# Patient Record
Sex: Male | Born: 1940
Health system: Southern US, Community
[De-identification: ages and names within clinical notes are randomized; demographics above are authoritative.]

## PROBLEM LIST (undated history)

## (undated) DIAGNOSIS — J302 Other seasonal allergic rhinitis: Secondary | ICD-10-CM

## (undated) DIAGNOSIS — R972 Elevated prostate specific antigen [PSA]: Secondary | ICD-10-CM

## (undated) DIAGNOSIS — I10 Essential (primary) hypertension: Secondary | ICD-10-CM

## (undated) DIAGNOSIS — N259 Disorder resulting from impaired renal tubular function, unspecified: Secondary | ICD-10-CM

## (undated) DIAGNOSIS — E119 Type 2 diabetes mellitus without complications: Secondary | ICD-10-CM

## (undated) DIAGNOSIS — M109 Gout, unspecified: Secondary | ICD-10-CM

## (undated) DIAGNOSIS — Z0000 Encounter for general adult medical examination without abnormal findings: Secondary | ICD-10-CM

## (undated) DIAGNOSIS — C61 Malignant neoplasm of prostate: Secondary | ICD-10-CM

## (undated) DIAGNOSIS — F528 Other sexual dysfunction not due to a substance or known physiological condition: Secondary | ICD-10-CM

## (undated) DIAGNOSIS — J309 Allergic rhinitis, unspecified: Secondary | ICD-10-CM

## (undated) DIAGNOSIS — E785 Hyperlipidemia, unspecified: Secondary | ICD-10-CM

## (undated) DIAGNOSIS — E669 Obesity, unspecified: Secondary | ICD-10-CM

## (undated) HISTORY — DX: Allergic rhinitis, unspecified: J30.9

## (undated) HISTORY — DX: Other seasonal allergic rhinitis: J30.2

## (undated) HISTORY — DX: Type 2 diabetes mellitus without complications: E11.9

## (undated) HISTORY — DX: Encounter for general adult medical examination without abnormal findings: Z00.00

## (undated) HISTORY — DX: Obesity, unspecified: E66.9

## (undated) HISTORY — DX: Other sexual dysfunction not due to a substance or known physiological condition: F52.8

## (undated) HISTORY — DX: Disorder resulting from impaired renal tubular function, unspecified: N25.9

## (undated) HISTORY — DX: Hyperlipidemia, unspecified: E78.5

## (undated) HISTORY — DX: Gout, unspecified: M10.9

## (undated) HISTORY — DX: Essential (primary) hypertension: I10

## (undated) HISTORY — DX: Elevated prostate specific antigen (PSA): R97.20

## (undated) HISTORY — DX: Malignant neoplasm of prostate: C61

---

## 2004-09-23 ENCOUNTER — Ambulatory Visit: Payer: Self-pay | Admitting: Internal Medicine

## 2005-12-02 ENCOUNTER — Ambulatory Visit: Payer: Self-pay | Admitting: Internal Medicine

## 2006-06-03 ENCOUNTER — Ambulatory Visit: Payer: Self-pay | Admitting: Internal Medicine

## 2007-05-08 ENCOUNTER — Encounter: Payer: Self-pay | Admitting: Internal Medicine

## 2007-05-08 DIAGNOSIS — F528 Other sexual dysfunction not due to a substance or known physiological condition: Secondary | ICD-10-CM

## 2007-05-08 DIAGNOSIS — E119 Type 2 diabetes mellitus without complications: Secondary | ICD-10-CM

## 2007-05-08 DIAGNOSIS — E785 Hyperlipidemia, unspecified: Secondary | ICD-10-CM

## 2007-05-08 DIAGNOSIS — E669 Obesity, unspecified: Secondary | ICD-10-CM | POA: Insufficient documentation

## 2007-05-08 DIAGNOSIS — Z794 Long term (current) use of insulin: Secondary | ICD-10-CM

## 2007-05-08 DIAGNOSIS — I10 Essential (primary) hypertension: Secondary | ICD-10-CM

## 2007-05-08 HISTORY — DX: Essential (primary) hypertension: I10

## 2007-05-08 HISTORY — DX: Obesity, unspecified: E66.9

## 2007-05-08 HISTORY — DX: Type 2 diabetes mellitus without complications: E11.9

## 2007-05-08 HISTORY — DX: Hyperlipidemia, unspecified: E78.5

## 2007-05-08 HISTORY — DX: Other sexual dysfunction not due to a substance or known physiological condition: F52.8

## 2007-06-30 ENCOUNTER — Ambulatory Visit: Payer: Self-pay | Admitting: Internal Medicine

## 2007-06-30 ENCOUNTER — Encounter: Payer: Self-pay | Admitting: Internal Medicine

## 2007-07-01 DIAGNOSIS — M109 Gout, unspecified: Secondary | ICD-10-CM

## 2007-07-01 DIAGNOSIS — N259 Disorder resulting from impaired renal tubular function, unspecified: Secondary | ICD-10-CM

## 2007-07-01 DIAGNOSIS — N183 Chronic kidney disease, stage 3 (moderate): Secondary | ICD-10-CM

## 2007-07-01 HISTORY — DX: Gout, unspecified: M10.9

## 2007-07-01 HISTORY — DX: Disorder resulting from impaired renal tubular function, unspecified: N25.9

## 2007-07-04 ENCOUNTER — Telehealth (INDEPENDENT_AMBULATORY_CARE_PROVIDER_SITE_OTHER): Payer: Self-pay | Admitting: *Deleted

## 2007-07-06 LAB — CONVERTED CEMR LAB
ALT: 31 units/L (ref 0–53)
Albumin: 3.9 g/dL (ref 3.5–5.2)
BUN: 19 mg/dL (ref 6–23)
Bilirubin, Direct: 0.1 mg/dL (ref 0.0–0.3)
Chloride: 100 meq/L (ref 96–112)
Creatinine, Ser: 2 mg/dL — ABNORMAL HIGH (ref 0.4–1.5)
Direct LDL: 98.1 mg/dL
Eosinophils Relative: 2.2 % (ref 0.0–5.0)
GFR calc Af Amer: 43 mL/min
Glucose, Bld: 213 mg/dL — ABNORMAL HIGH (ref 70–99)
Hemoglobin: 17.5 g/dL — ABNORMAL HIGH (ref 13.0–17.0)
Hgb A1c MFr Bld: 9.5 % — ABNORMAL HIGH (ref 4.6–6.0)
Ketones, ur: NEGATIVE mg/dL
MCV: 91.1 fL (ref 78.0–100.0)
Monocytes Relative: 9.3 % (ref 3.0–11.0)
PSA: 3.62 ng/mL (ref 0.10–4.00)
Potassium: 3.8 meq/L (ref 3.5–5.1)
RBC: 5.6 M/uL (ref 4.22–5.81)
TSH: 1.24 microintl units/mL (ref 0.35–5.50)
Total CHOL/HDL Ratio: 4.9
Total Protein, Urine: NEGATIVE mg/dL
Triglycerides: 214 mg/dL (ref 0–149)
WBC: 6.5 10*3/uL (ref 4.5–10.5)
pH: 5.5 (ref 5.0–8.0)

## 2008-03-16 ENCOUNTER — Ambulatory Visit: Payer: Self-pay | Admitting: Internal Medicine

## 2008-03-20 ENCOUNTER — Encounter: Payer: Self-pay | Admitting: Internal Medicine

## 2008-03-21 LAB — CONVERTED CEMR LAB
Calcium: 9.6 mg/dL (ref 8.4–10.5)
Chloride: 101 meq/L (ref 96–112)
Cholesterol: 149 mg/dL (ref 0–200)
Creatinine, Ser: 2.1 mg/dL — ABNORMAL HIGH (ref 0.4–1.5)
Direct LDL: 87.1 mg/dL
GFR calc non Af Amer: 33 mL/min
HDL: 38 mg/dL — ABNORMAL LOW (ref >39.0)
Triglycerides: 240 mg/dL (ref 0–149)
VLDL: 48 mg/dL — ABNORMAL HIGH (ref 0–40)

## 2008-06-22 ENCOUNTER — Ambulatory Visit: Payer: Self-pay | Admitting: Internal Medicine

## 2008-07-02 DIAGNOSIS — C61 Malignant neoplasm of prostate: Secondary | ICD-10-CM

## 2008-07-02 DIAGNOSIS — R972 Elevated prostate specific antigen [PSA]: Secondary | ICD-10-CM

## 2008-07-02 HISTORY — DX: Malignant neoplasm of prostate: C61

## 2008-07-02 HISTORY — DX: Elevated prostate specific antigen (PSA): R97.20

## 2009-07-01 ENCOUNTER — Ambulatory Visit: Payer: Self-pay | Admitting: Internal Medicine

## 2009-07-01 LAB — HM COLONOSCOPY

## 2009-12-27 ENCOUNTER — Ambulatory Visit: Payer: Self-pay | Admitting: Internal Medicine

## 2009-12-27 LAB — CONVERTED CEMR LAB
BUN: 23 mg/dL (ref 6–23)
Potassium: 3.9 meq/L (ref 3.5–5.1)
Sodium: 142 meq/L (ref 135–145)
Triglycerides: 79 mg/dL (ref 0.0–149.0)
VLDL: 15.8 mg/dL (ref 0.0–40.0)

## 2010-01-01 ENCOUNTER — Ambulatory Visit: Payer: Self-pay | Admitting: Internal Medicine

## 2010-03-05 ENCOUNTER — Encounter: Payer: Self-pay | Admitting: Internal Medicine

## 2010-03-24 ENCOUNTER — Encounter: Admission: RE | Admit: 2010-03-24 | Discharge: 2010-03-24 | Payer: Self-pay | Admitting: Nephrology

## 2010-05-26 ENCOUNTER — Encounter: Payer: Self-pay | Admitting: Internal Medicine

## 2010-06-05 ENCOUNTER — Encounter: Payer: Self-pay | Admitting: Internal Medicine

## 2010-06-25 ENCOUNTER — Ambulatory Visit: Payer: Self-pay | Admitting: Internal Medicine

## 2010-06-25 LAB — CONVERTED CEMR LAB
Albumin: 3.7 g/dL (ref 3.5–5.2)
BUN: 29 mg/dL — ABNORMAL HIGH (ref 6–23)
Basophils Relative: 0.5 % (ref 0.0–3.0)
CO2: 28 meq/L (ref 19–32)
Calcium: 9.7 mg/dL (ref 8.4–10.5)
Eosinophils Relative: 1.2 % (ref 0.0–5.0)
GFR calc non Af Amer: 35.79 mL/min (ref >60)
Glucose, Bld: 187 mg/dL — ABNORMAL HIGH (ref 70–99)
HCT: 47.4 % (ref 39.0–52.0)
Hemoglobin: 16.6 g/dL (ref 13.0–17.0)
Ketones, ur: NEGATIVE mg/dL
LDL Cholesterol: 91 mg/dL (ref 0–99)
Lymphs Abs: 2.2 10*3/uL (ref 0.7–4.0)
MCV: 91.8 fL (ref 78.0–100.0)
Microalb, Ur: 3.2 mg/dL — ABNORMAL HIGH (ref 0.0–1.9)
Monocytes Absolute: 0.9 10*3/uL (ref 0.1–1.0)
Monocytes Relative: 13.6 % — ABNORMAL HIGH (ref 3.0–12.0)
Neutro Abs: 3.1 10*3/uL (ref 1.4–7.7)
Neutrophils Relative %: 49.4 % (ref 43.0–77.0)
Nitrite: NEGATIVE
PSA: 5.98 ng/mL — ABNORMAL HIGH (ref 0.10–4.00)
Potassium: 4.5 meq/L (ref 3.5–5.1)
Sodium: 136 meq/L (ref 135–145)
Total Bilirubin: 0.7 mg/dL (ref 0.3–1.2)
Total CHOL/HDL Ratio: 4
Total Protein, Urine: NEGATIVE mg/dL
Urine Glucose: 250 mg/dL
Urobilinogen, UA: 0.2 (ref 0.0–1.0)

## 2010-07-01 ENCOUNTER — Ambulatory Visit: Payer: Self-pay | Admitting: Internal Medicine

## 2010-07-01 ENCOUNTER — Encounter: Payer: Self-pay | Admitting: Internal Medicine

## 2010-07-08 ENCOUNTER — Telehealth: Payer: Self-pay | Admitting: Internal Medicine

## 2010-08-12 ENCOUNTER — Ambulatory Visit: Payer: Self-pay | Admitting: Internal Medicine

## 2010-08-13 LAB — CONVERTED CEMR LAB
BUN: 31 mg/dL — ABNORMAL HIGH (ref 6–23)
Calcium: 9.8 mg/dL (ref 8.4–10.5)
GFR calc non Af Amer: 39.27 mL/min — ABNORMAL LOW (ref >60.00)
Glucose, Bld: 205 mg/dL — ABNORMAL HIGH (ref 70–99)
Sodium: 136 meq/L (ref 135–145)

## 2010-08-14 ENCOUNTER — Telehealth (INDEPENDENT_AMBULATORY_CARE_PROVIDER_SITE_OTHER): Payer: Self-pay | Admitting: *Deleted

## 2010-08-18 ENCOUNTER — Encounter: Payer: Self-pay | Admitting: Internal Medicine

## 2010-09-28 LAB — CONVERTED CEMR LAB
ALT: 21 units/L (ref 0–53)
ALT: 28 units/L (ref 0–53)
AST: 29 units/L (ref 0–37)
AST: 35 units/L (ref 0–37)
Albumin: 3.8 g/dL (ref 3.5–5.2)
Alkaline Phosphatase: 59 units/L (ref 39–117)
Alkaline Phosphatase: 62 units/L (ref 39–117)
BUN: 23 mg/dL (ref 6–23)
Basophils Absolute: 0 10*3/uL (ref 0.0–0.1)
Basophils Relative: 0 % (ref 0.0–3.0)
Basophils Relative: 0 % (ref 0.0–3.0)
Bilirubin, Direct: 0.1 mg/dL (ref 0.0–0.3)
Bilirubin, Direct: 0.1 mg/dL (ref 0.0–0.3)
CO2: 30 meq/L (ref 19–32)
Cholesterol: 147 mg/dL (ref 0–200)
Cholesterol: 154 mg/dL (ref 0–200)
Creatinine, Ser: 2.1 mg/dL — ABNORMAL HIGH (ref 0.4–1.5)
Creatinine,U: 148.1 mg/dL
Creatinine,U: 280.9 mg/dL
Eosinophils Absolute: 0.1 10*3/uL (ref 0.0–0.7)
GFR calc Af Amer: 40 mL/min
GFR calc non Af Amer: 33 mL/min
HCT: 49.6 % (ref 39.0–52.0)
HDL: 38.7 mg/dL — ABNORMAL LOW (ref >39.0)
HDL: 39.2 mg/dL (ref >39.00)
Hemoglobin, Urine: NEGATIVE
Hemoglobin: 16.6 g/dL (ref 13.0–17.0)
Hemoglobin: 17.1 g/dL — ABNORMAL HIGH (ref 13.0–17.0)
Ketones, ur: NEGATIVE mg/dL
Ketones, ur: NEGATIVE mg/dL
LDL Cholesterol: 94 mg/dL (ref 0–99)
Leukocytes, UA: NEGATIVE
Lymphocytes Relative: 28.8 % (ref 12.0–46.0)
Lymphocytes Relative: 35.6 % (ref 12.0–46.0)
MCHC: 33.6 g/dL (ref 30.0–36.0)
MCHC: 34 g/dL (ref 30.0–36.0)
MCV: 92.2 fL (ref 78.0–100.0)
MCV: 94.3 fL (ref 78.0–100.0)
Microalb Creat Ratio: 10.7 mg/g (ref 0.0–30.0)
Microalb, Ur: 3 mg/dL — ABNORMAL HIGH (ref 0.0–1.9)
Monocytes Absolute: 0.4 10*3/uL (ref 0.1–1.0)
Monocytes Absolute: 0.8 10*3/uL (ref 0.1–1.0)
Monocytes Relative: 12.9 % — ABNORMAL HIGH (ref 3.0–12.0)
Monocytes Relative: 6.4 % (ref 3.0–12.0)
Neutrophils Relative %: 63.3 % (ref 43.0–77.0)
Nitrite: NEGATIVE
Nitrite: NEGATIVE
PSA: 4.63 ng/mL — ABNORMAL HIGH (ref 0.10–4.00)
Platelets: 129 10*3/uL — ABNORMAL LOW (ref 150–400)
Potassium: 3.5 meq/L (ref 3.5–5.1)
Potassium: 3.7 meq/L (ref 3.5–5.1)
RBC: 5.26 M/uL (ref 4.22–5.81)
RBC: 5.45 M/uL (ref 4.22–5.81)
RDW: 11.9 % (ref 11.5–14.6)
Specific Gravity, Urine: 1.025 (ref 1.000–1.03)
Specific Gravity, Urine: >=1.03 (ref 1.000–1.030)
TSH: 1.23 microintl units/mL (ref 0.35–5.50)
TSH: 1.25 microintl units/mL (ref 0.35–5.50)
Total Bilirubin: 0.8 mg/dL (ref 0.3–1.2)
Total Protein: 7.4 g/dL (ref 6.0–8.3)
Total Protein: 7.8 g/dL (ref 6.0–8.3)
Urine Glucose: 250 mg/dL
Urine Glucose: 250 mg/dL — AB
Urobilinogen, UA: 0.2 (ref 0.0–1.0)
Urobilinogen, UA: 1 (ref 0.0–1.0)
VLDL: 18 mg/dL (ref 0–40)
WBC: 6.3 10*3/uL (ref 4.5–10.5)
pH: 5.5 (ref 5.0–8.0)

## 2010-09-30 NOTE — Progress Notes (Signed)
  Phone Note Refill Request Message from:  Fax from Pharmacy on July 08, 2010 3:53 PM  Refills Requested: Medication #1:  DILTIAZEM HCL ER BEADS 240 MG  CP24 1 by mouth once daily   Dosage confirmed as above?Dosage Confirmed   Notes: [facility]  Medication #2:  LANTUS 100 UNIT/ML SOLN Inject 65 unit subcutaneously once a day   Dosage confirmed as above?Dosage Confirmed   Notes: [facility]  Medication #3:  LOVASTATIN 40 MG TABS Take 1 tablet by mouth once a day   Dosage confirmed as above?Dosage Confirmed   Notes: [facility]  Medication #4:  LISINOPRIL-HYDROCHLOROTHIAZIDE 20-12.5 MG TABS 2 by mouth once daily   Dosage confirmed as above?Dosage Confirmed   Notes: [facility] Initial call taken by: Robin Ewing CMA Duncan Dull),  July 08, 2010 3:54 PM    Prescriptions: LISINOPRIL-HYDROCHLOROTHIAZIDE 20-12.5 MG TABS (LISINOPRIL-HYDROCHLOROTHIAZIDE) 2 by mouth once daily  #180 x 3   Entered by:   Scharlene Gloss CMA (AAMA)   Authorized by:   Corwin Levins MD   Signed by:   Scharlene Gloss CMA (AAMA) on 07/08/2010   Method used:   [facility] ...       [facility]             , [place]         Ph:        Fax: [phone]   RxID:   [id number] LOVASTATIN 40 MG TABS (LOVASTATIN) Take 1 tablet by mouth once a day  #90 x 3   Entered by:   Zella Ball Ewing CMA (AAMA)   Authorized by:   Corwin Levins MD   Signed by:   Scharlene Gloss CMA (AAMA) on 07/08/2010   Method used:   Faxed to ...       [facility]             , [place]         Ph:        Fax: [phone]   RxID:   [id number] LANTUS 100 UNIT/ML SOLN (INSULIN GLARGINE) Inject 65 unit subcutaneously once a day  #6vials x 11   Entered by:   Zella Ball Ewing CMA (AAMA)   Authorized by:   Corwin Levins MD   Signed by:   Scharlene Gloss CMA (AAMA) on 07/08/2010   Method used:   Faxed to ...       [facility]             , [place]         Ph:        Fax: [phone]   RxID:   [id number] DILTIAZEM HCL ER  BEADS 240 MG  CP24 (DILTIAZEM HCL ER BEADS) 1 by mouth once daily  #90 x 3   Entered by:   Zella Ball Ewing CMA (AAMA)   Authorized by:   Corwin Levins MD   Signed by:   Scharlene Gloss CMA (AAMA) on 07/08/2010   Method used:   Faxed to ...       [facility]             , [place]         Ph:        Fax: [phone]   RxID:   [id number]

## 2010-09-30 NOTE — Consult Note (Signed)
Summary: [facility]  [facility]   Imported By: Lennie Odor 06/16/2010 15:43:38  _____________________________________________________________________  External Attachment:    Type:   Image     Comment:   External Document

## 2010-09-30 NOTE — Assessment & Plan Note (Signed)
Summary: YEARLY--6 MTH---STC   Vital Signs:  Patient profile:   70 year old male Height:      72 inches Weight:      208.25 pounds BMI:     28.35 O2 Sat:      96 % on Room air Temp:     98.4 degrees F oral Pulse rate:   64 / minute BP sitting:   152 / 78  (left arm) Cuff size:   regular  Vitals Entered By: Margaret Pyle, CMA (July 01, 2010 1:12 PM)  O2 Flow:  Room air  Preventive Care Screening     declines colonoscopy again  CC: Yearly Is Patient Diabetic? Yes   CC:  Yearly.  History of Present Illness: here for wellness and f/u;  overall doing well;  Pt denies CP, worsening sob, doe, wheezing, orthopnea, pnd, worsening LE edema, palps, dizziness or syncope  Pt denies new neuro symptoms such as headache, facial or extremity weakness  Pt denies polydipsia, polyuria, but only taking 60 units lantus due to frequent low AM sugars.  Overall good compliance with meds, trying to follow low chol, DM diet, wt stable, little excercise however No fever, wt loss, night sweats, loss of appetite or other constitutional symptoms Denies worsening depressive symptoms, suicidal ideation, or panic.  Pt states good ability with ADL's, low fall risk, home safety reviewed and adequate, no significant change in hearing or vision, trying to follow lower chol diet, and occasionally active only with regular excercise.   Preventive Screening-Counseling & Management      Drug Use:  no.    Problems Prior to Update: 1)  Psa, Increased  (ICD-790.93) 2)  Preventive Health Care  (ICD-V70.0) 3)  Preventive Health Care  (ICD-V70.0) 4)  Gout  (ICD-274.9) 5)  Renal Insufficiency  (ICD-588.9) 6)  Hypertension  (ICD-401.9) 7)  Hyperlipidemia  (ICD-272.4) 8)  Diabetes Mellitus, Type II  (ICD-250.00) 9)  Obesity  (ICD-278.00) 10)  Erectile Dysfunction  (ICD-302.72)  Medications Prior to Update: 1)  Diltiazem Hcl Er Beads 240 Mg  Cp24 (Diltiazem Hcl Er Beads) .[name].. 1 By Mouth Once Daily 2)   Lantus 100 Unit/ml Soln (Insulin Glargine) .... Inject 85 Unit Subcutaneously Once A Day 3)  Lovastatin 40 Mg Tabs (Lovastatin) .... Take 1 Tablet By Mouth Once A Day 4)  Lisinopril-Hydrochlorothiazide 20-12.5 Mg Tabs (Lisinopril-Hydrochlorothiazide) .... 2 By Mouth Once Daily 5)  Viagra 100 Mg Tabs (Sildenafil Citrate) .... Take 1 Tablet By Mouth Once A Day 6)  Tramadol Hcl 50 Mg Tabs (Tramadol Hcl) .[name].. 1po Q 6 Hrs As Needed Pain 7)  Ecotrin Low Strength 81 Mg  Tbec (Aspirin) .[name].. 1po Once Daily  Current Medications (verified): 1)  Diltiazem Hcl Er Beads 240 Mg  Cp24 (Diltiazem Hcl Er Beads) .[name].. 1 By Mouth Once Daily 2)  Lantus 100 Unit/ml Soln (Insulin Glargine) .... Inject 65 Unit Subcutaneously Once A Day 3)  Lovastatin 40 Mg Tabs (Lovastatin) .... Take 1 Tablet By Mouth Once A Day 4)  Lisinopril-Hydrochlorothiazide 20-12.5 Mg Tabs (Lisinopril-Hydrochlorothiazide) .... 2 By Mouth Once Daily 5)  Ecotrin Low Strength 81 Mg  Tbec (Aspirin) .[name].. 1po Once Daily 6)  Januvia 50 Mg Tabs (Sitagliptin Phosphate) .[name].. 1po Once Daily 7)  Pain Med Per Renal 8)  Levitra 20 Mg Tabs (Vardenafil Hcl) .[name].. 1 By Mouth Once Daily As Needed  Allergies (verified): 1)  Nsaids  Past History:  Past Medical History: Last updated: 06/30/2007 Erectile Dysfunction Obesity Diabetes mellitus, type II Hyperlipidemia  Hypertension Renal insufficiency Gout  Past Surgical History: Last updated: 06/30/2007 none  Family History: Last updated: 06/22/2008 Family History Hypertension 1 child died - homicide in [place] - 08/20/2023  Social History: Last updated: 07/01/2010 Former Smoker Alcohol use-no Married 6 children retired since 2005 - [employer] Drug use-no  Risk Factors: Smoking Status: quit (06/30/2007)  Social History: Former Smoker Alcohol use-no Married 6 children retired since 2005 - [employer] Drug use-no Drug Use:  no  Review of Systems  The patient denies  anorexia, fever, vision loss, decreased hearing, hoarseness, chest pain, syncope, dyspnea on exertion, peripheral edema, prolonged cough, headaches, hemoptysis, abdominal pain, melena, hematochezia, severe indigestion/heartburn, hematuria, muscle weakness, suspicious skin lesions, transient blindness, difficulty walking, depression, unusual weight change, abnormal bleeding, enlarged lymph nodes, and angioedema.         all otherwise negative per pt -    Physical Exam  General:  alert and overweight-appearing.   Head:  normocephalic and atraumatic.   Eyes:  vision grossly intact, pupils equal, and pupils round.   Ears:  R ear normal and L ear normal.   Nose:  no external deformity and no nasal discharge.   Mouth:  no gingival abnormalities and pharynx pink and moist.   Neck:  supple and no masses.   Lungs:  normal respiratory effort and normal breath sounds.   Heart:  normal rate and regular rhythm.   Abdomen:  soft, non-tender, and normal bowel sounds.   Msk:  no joint tenderness and no joint swelling.   Extremities:  no edema, no erythema  Neurologic:  cranial nerves II-XII intact and strength normal in all extremities.   Skin:  color normal and no rashes.   Psych:  not anxious appearing and not depressed appearing.     Impression & Recommendations:  Problem # 1:  Preventive Health Care (ICD-V70.0) Overall doing well, age appropriate education and counseling updated, referral for preventive services and immunizations addressed, dietary counseling and smoking status adressed , most recent labs reviewed, ecg reviewed I have personally reviewed and have noted 1.The patient's medical and social history 2.Their use of alcohol, tobacco or illicit drugs 3.Their current medications and supplements 4. Functional ability including ADL's, fall risk, home safety risk, hearing & visual impairment  Overall doing well, age appropriate education and counseling updated, referral for preventive  services and immunizations addressed, dietary counseling and smoking status adressed , most recent labs reviewed, ecg reviewed or declined I have personally reviewed and have noted 1.The patient's medical and social history 2.Their use of alcohol, tobacco or illicit drugs 3.Their current medications and supplements 4. Functional ability including ADL's, fall risk, home safety risk, hearing & visual impairment 5.Diet and physical activities 6.Evidence for depression or mood disorders The patients weight, height, BMI  have been recorded in the chart I have made referrals, counseling and provided education to the patient based review of the above  Orders: EKG w/ Interpretation (93000)  Problem # 2:  PSA, INCREASED (ICD-790.93) for urology referral - r/o cancer prostate Orders: Urology Referral (Urology)  Problem # 3:  DIABETES MELLITUS, TYPE II (ICD-250.00)  His updated medication list for this problem includes:    Lantus 100 Unit/ml Soln (Insulin glargine) ..... Inject 65 unit subcutaneously once a day    Lisinopril-hydrochlorothiazide 20-12.5 Mg Tabs (Lisinopril-hydrochlorothiazide) .[name]... 2 by mouth once daily    Ecotrin Low Strength 81 Mg Tbec (Aspirin) .[name]... 1po once daily    Januvia 50 Mg Tabs (Sitagliptin phosphate) .Marland KitchenMarland KitchenMarland KitchenMarland Kitchen  1po once daily to incr the lantus from 60 to 65 units, and add januvia 50 mg daily  Labs Reviewed: Creat: 2.3 (06/25/2010)    Reviewed HgBA1c results: 8.8 (06/25/2010)  9.8 (12/27/2009)  Problem # 4:  RENAL INSUFFICIENCY (ICD-588.9) stable overall by hx and exam, ok to continue meds/tx as is , for renal f/u soon per pt  Problem # 5:  HYPERTENSION (ICD-401.9)  His updated medication list for this problem includes:    Diltiazem Hcl Er Beads 240 Mg Cp24 (Diltiazem hcl er beads) .[name]... 1 by mouth once daily    Lisinopril-hydrochlorothiazide 20-12.5 Mg Tabs (Lisinopril-hydrochlorothiazide) .[name]... 2 by mouth once daily Normal BP at home per pt - Continue all  previous medications as before this visit   BP today: 152/78 Prior BP: 150/78 (01/01/2010)  Labs Reviewed: K+: 4.5 (06/25/2010) Creat: : 2.3 (06/25/2010)   Chol: 159 (06/25/2010)   HDL: 41.40 (06/25/2010)   LDL: 91 (06/25/2010)   TG: 135.0 (06/25/2010)  Problem # 6:  HYPERLIPIDEMIA (ICD-272.4)  His updated medication list for this problem includes:    Lovastatin 40 Mg Tabs (Lovastatin) .[name]... Take 1 tablet by mouth once a day  Labs Reviewed: SGOT: 29 (06/25/2010)   SGPT: 20 (06/25/2010)   HDL:41.40 (06/25/2010), 38.80 (12/27/2009)  LDL:91 (06/25/2010), 89 (12/27/2009)  Chol:159 (06/25/2010), 144 (12/27/2009)  Trig:135.0 (06/25/2010), 79.0 (12/27/2009) stable overall by hx and exam, ok to continue meds/tx as is   Problem # 7:  ERECTILE DYSFUNCTION (ICD-302.72)  The following medications were removed from the medication list:    Viagra 100 Mg Tabs (Sildenafil citrate) .[name]... Take 1 tablet by mouth once a day His updated medication list for this problem includes:    Levitra 20 Mg Tabs (Vardenafil hcl) .[name]... 1 by mouth once daily as needed treat as above, f/u any worsening signs or symptoms   Complete Medication List: 1)  Diltiazem Hcl Er Beads 240 Mg Cp24 (Diltiazem hcl er beads) .[name].. 1 by mouth once daily 2)  Lantus 100 Unit/ml Soln (Insulin glargine) .... Inject 65 unit subcutaneously once a day 3)  Lovastatin 40 Mg Tabs (Lovastatin) .... Take 1 tablet by mouth once a day 4)  Lisinopril-hydrochlorothiazide 20-12.5 Mg Tabs (Lisinopril-hydrochlorothiazide) .... 2 by mouth once daily 5)  Ecotrin Low Strength 81 Mg Tbec (Aspirin) .[name].. 1po once daily 6)  Januvia 50 Mg Tabs (Sitagliptin phosphate) .[name].. 1po once daily 7)  Pain Med Per Renal  8)  Levitra 20 Mg Tabs (Vardenafil hcl) .[name].. 1 by mouth once daily as needed  Other Orders: Admin 1st Vaccine (16109) Flu Vaccine 48yrs + [phone])  Patient Instructions: 1)  You will be contacted about the referral(s) to: urology 2)  you are given  the new prescription for the levitra (remember it is $9 per pill at [facility] or [facility]) 3)  you are also given the other prescriptions to send to [facility] 4)  increase the lantus to 65 units per day 5)  start the januvia 50 mg per day 6)  Please return for lab only in 6 wks: 7)  BMP prior to visit, ICD-9: 250.02 8)  HbgA1C prior to visit, ICD-9: 9)  Please call the number on the [facility] Card for results of your testing  10)  Please schedule a follow-up appointment in 4 months with: 11)  BMP prior to visit, ICD-9: 250.02 12)  Lipid Panel prior to visit, ICD-9: 13)  HbgA1C prior to visit, ICD-9: Prescriptions: LANTUS 100 UNIT/ML SOLN (INSULIN GLARGINE) Inject 65 unit subcutaneously once  a day  #6vials x 11   Entered and Authorized by:   Corwin Levins MD   Signed by:   Corwin Levins MD on 07/01/2010   Method used:   Print then Give to Patient   RxID:   [id number] JANUVIA 50 MG TABS (SITAGLIPTIN PHOSPHATE) 1po once daily  #90 x 3   Entered and Authorized by:   Corwin Levins MD   Signed by:   Corwin Levins MD on 07/01/2010   Method used:   Print then Give to Patient   RxID:   [id number] LISINOPRIL-HYDROCHLOROTHIAZIDE 20-12.5 MG TABS (LISINOPRIL-HYDROCHLOROTHIAZIDE) 2 by mouth once daily  #180 x 3   Entered and Authorized by:   Corwin Levins MD   Signed by:   Corwin Levins MD on 07/01/2010   Method used:   Print then Give to Patient   RxID:   [id number] LOVASTATIN 40 MG TABS (LOVASTATIN) Take 1 tablet by mouth once a day  #90 x 3   Entered and Authorized by:   Corwin Levins MD   Signed by:   Corwin Levins MD on 07/01/2010   Method used:   Print then Give to Patient   RxID:   [id number] LANTUS 100 UNIT/ML SOLN (INSULIN GLARGINE) Inject 65 unit subcutaneously once a day  #5 pens x 3   Entered and Authorized by:   Corwin Levins MD   Signed by:   Corwin Levins MD on 07/01/2010   Method used:   Print then Give to Patient   RxID:   [id number] LEVITRA 20 MG TABS  (VARDENAFIL HCL) 1 by mouth once daily as needed  #5 x 11   Entered and Authorized by:   Corwin Levins MD   Signed by:   Corwin Levins MD on 07/01/2010   Method used:   Print then Give to Patient   RxID:   [id number] DILTIAZEM HCL ER BEADS 240 MG  CP24 (DILTIAZEM HCL ER BEADS) 1 by mouth once daily  #90 x 3   Entered and Authorized by:   Corwin Levins MD   Signed by:   Corwin Levins MD on 07/01/2010   Method used:   Print then Give to Patient   RxID:   [id number]    Orders Added: 1)  Admin 1st Vaccine [90471] 2)  Flu Vaccine 83yrs + [76283] 3)  EKG w/ Interpretation [93000] 4)  Urology Referral [Urology] 5)  Est. Patient 65& > [15176]   Flu Vaccine Consent Questions     Do you have a history of severe allergic reactions to this vaccine? no    Any prior history of allergic reactions to egg and/or gelatin? no    Do you have a sensitivity to the preservative Thimersol? no    Do you have a past history of Guillan-Barre Syndrome? no    Do you currently have an acute febrile illness? no    Have you ever had a severe reaction to latex? no    Vaccine information given and explained to patient? yes    Are you currently pregnant? no    Lot Number:[id number]   Exp Date:02/28/2011   Site Given  Left Deltoid [id number]

## 2010-09-30 NOTE — Consult Note (Signed)
Summary: [facility]  [facility]   Imported By: Lennie Odor 03/18/2010 10:38:18  _____________________________________________________________________  External Attachment:    Type:   Image     Comment:   External Document

## 2010-09-30 NOTE — Assessment & Plan Note (Signed)
Summary: 6 mo rov /nws  #   Vital Signs:  Patient profile:   70 year old male Height:      74 inches Weight:      211 pounds BMI:     27.19 O2 Sat:      96 % on Room air Temp:     97.6 degrees F oral Pulse rate:   59 / minute BP sitting:   150 / 78  (left arm) Cuff size:   large  Vitals Entered ByZella Ball Ewing (Jan 01, 2010 1:24 PM)  O2 Flow:  Room air CC: 6 Month ROV/RE   CC:  6 Month ROV/RE.  History of Present Illness: overall doing ok;  BP at home usually < 140/90;  Pt denies CP, sob, doe, wheezing, orthopnea, pnd, worsening LE edema, palps, dizziness or syncope  Pt denies new neuro symptoms such as headache, facial or extremity weakness Admits to some dietary noncomplaicne  but having also low sugars ooccas in the am on currnet lantus.  Goes to gym 3 times per wk - feels better to do that.  Pt denies polydipsia, polyuria, or low sugar symptoms such as shakiness improved with eating.  Overall good compliance with meds, trying to follow low chol, DM diet, wt stable, little excercise however, and cbg/s often in the 200 - 300's.    Problems Prior to Update: 1)  Psa, Increased  (ICD-790.93) 2)  Preventive Health Care  (ICD-V70.0) 3)  Preventive Health Care  (ICD-V70.0) 4)  Gout  (ICD-274.9) 5)  Renal Insufficiency  (ICD-588.9) 6)  Hypertension  (ICD-401.9) 7)  Hyperlipidemia  (ICD-272.4) 8)  Diabetes Mellitus, Type II  (ICD-250.00) 9)  Obesity  (ICD-278.00) 10)  Erectile Dysfunction  (ICD-302.72)  Medications Prior to Update: 1)  Diltiazem Hcl Er Beads 240 Mg  Cp24 (Diltiazem Hcl Er Beads) .[name].. 1 By Mouth Once Daily 2)  Lantus 100 Unit/ml Soln (Insulin Glargine) .... Inject 85 Unit Subcutaneously Once A Day 3)  Lovastatin 40 Mg Tabs (Lovastatin) .... Take 1 Tablet By Mouth Once A Day 4)  Lisinopril-Hydrochlorothiazide 20-12.5 Mg Tabs (Lisinopril-Hydrochlorothiazide) .... 2 By Mouth Once Daily 5)  Viagra 100 Mg Tabs (Sildenafil Citrate) .... Take 1 Tablet By Mouth Once A  Day 6)  Indomethacin 50 Mg Caps (Indomethacin) .... Take 1 Capsule By Mouth Three Times A Day 7)  Ecotrin Low Strength 81 Mg  Tbec (Aspirin) .[name].. 1po Once Daily  Current Medications (verified): 1)  Diltiazem Hcl Er Beads 240 Mg  Cp24 (Diltiazem Hcl Er Beads) .[name].. 1 By Mouth Once Daily 2)  Lantus 100 Unit/ml Soln (Insulin Glargine) .... Inject 85 Unit Subcutaneously Once A Day 3)  Lovastatin 40 Mg Tabs (Lovastatin) .... Take 1 Tablet By Mouth Once A Day 4)  Lisinopril-Hydrochlorothiazide 20-12.5 Mg Tabs (Lisinopril-Hydrochlorothiazide) .... 2 By Mouth Once Daily 5)  Viagra 100 Mg Tabs (Sildenafil Citrate) .... Take 1 Tablet By Mouth Once A Day 6)  Indomethacin 50 Mg Caps (Indomethacin) .... Take 1 Capsule By Mouth Three Times A Day 7)  Ecotrin Low Strength 81 Mg  Tbec (Aspirin) .[name].. 1po Once Daily  Allergies (verified): No Known Drug Allergies  Past History:  Past Medical History: Last updated: 06/30/2007 Erectile Dysfunction Obesity Diabetes mellitus, type II Hyperlipidemia Hypertension Renal insufficiency Gout  Past Surgical History: Last updated: 06/30/2007 none  Social History: Last updated: 06/22/2008 Former Smoker Alcohol use-no Married 6 children retired since 2005 - [employer]  Risk Factors: Smoking Status: quit (06/30/2007)  Review  of Systems       all otherwise negative per pt -    Physical Exam  General:  alert and overweight-appearing.   Head:  normocephalic and atraumatic.   Eyes:  vision grossly intact, pupils equal, and pupils round.   Ears:  R ear normal and L ear normal.   Nose:  no external deformity and no nasal discharge.   Mouth:  no gingival abnormalities and pharynx pink and moist.   Neck:  supple and no masses.   Lungs:  normal respiratory effort and normal breath sounds.   Heart:  normal rate and regular rhythm.   Extremities:  no edema, no erythema    Impression & Recommendations:  Problem # 1:  RENAL INSUFFICIENCY  (ICD-588.9)  worsening, likely due to co-morbids;  refer renal  Orders: Nephrology Referral (Nephro)  Problem # 2:  DIABETES MELLITUS, TYPE II (ICD-250.00)  His updated medication list for this problem includes:    Lantus 100 Unit/ml Soln (Insulin glargine) ..... Inject 85 unit subcutaneously once a day    Lisinopril-hydrochlorothiazide 20-12.5 Mg Tabs (Lisinopril-hydrochlorothiazide) .[name]... 2 by mouth once daily    Ecotrin Low Strength 81 Mg Tbec (Aspirin) .[name]... 1po once daily with low AM sugars on current lantus;  will refer to Dr Glenna Fellows for further eval and tx, declines dietary referral at this time, has been once with his wife  Labs Reviewed: Creat: 2.4 (12/27/2009)    Reviewed HgBA1c results: 9.8 (12/27/2009)  9.0 (07/01/2009)  Orders: Endocrinology Referral (Endocrine)  Problem # 3:  HYPERLIPIDEMIA (ICD-272.4)  His updated medication list for this problem includes:    Lovastatin 40 Mg Tabs (Lovastatin) .[name]... Take 1 tablet by mouth once a day  Labs Reviewed: SGOT: 29 (07/01/2009)   SGPT: 21 (07/01/2009)   HDL:38.80 (12/27/2009), 39.20 (07/01/2009)  LDL:89 (12/27/2009), 94 (07/01/2009)  Chol:144 (12/27/2009), 147 (07/01/2009)  Trig:79.0 (12/27/2009), 69.0 (07/01/2009) stable overall by hx and exam, ok to continue meds/tx as is   Problem # 4:  HYPERTENSION (ICD-401.9)  His updated medication list for this problem includes:    Diltiazem Hcl Er Beads 240 Mg Cp24 (Diltiazem hcl er beads) .[name]... 1 by mouth once daily    Lisinopril-hydrochlorothiazide 20-12.5 Mg Tabs (Lisinopril-hydrochlorothiazide) .[name]... 2 by mouth once daily  BP today: 150/78 Prior BP: 162/90 (07/01/2009)  Labs Reviewed: K+: 3.9 (12/27/2009) Creat: : 2.4 (12/27/2009)   Chol: 144 (12/27/2009)   HDL: 38.80 (12/27/2009)   LDL: 89 (12/27/2009)   TG: 79.0 (12/27/2009) I suspect mild uncontrolled, but he does not want med change today  Complete Medication List: 1)  Diltiazem Hcl Er Beads 240 Mg Cp24  (Diltiazem hcl er beads) .[name].. 1 by mouth once daily 2)  Lantus 100 Unit/ml Soln (Insulin glargine) .... Inject 85 unit subcutaneously once a day 3)  Lovastatin 40 Mg Tabs (Lovastatin) .... Take 1 tablet by mouth once a day 4)  Lisinopril-hydrochlorothiazide 20-12.5 Mg Tabs (Lisinopril-hydrochlorothiazide) .... 2 by mouth once daily 5)  Viagra 100 Mg Tabs (Sildenafil citrate) .... Take 1 tablet by mouth once a day 6)  Indomethacin 50 Mg Caps (Indomethacin) .... Take 1 capsule by mouth three times a day 7)  Ecotrin Low Strength 81 Mg Tbec (Aspirin) .[name].. 1po once daily  Patient Instructions: 1)  Continue all previous medications as before this visit 2)  You will be contacted about the referral(s) to: Endocrinology (dr Everardo All), and Nephrology ([facility]) 3)  Please schedule a follow-up appointment in 6 months with CPX labs and : 4)  HbgA1C prior to visit, ICD-9: 250.02 5)  Urine Microalbumin prior to visit, ICD-9:

## 2010-10-02 NOTE — Medication Information (Signed)
Summary: Prior autho & approved for Januvia/[employer]  Prior autho & approved for Januvia/[employer]   Imported By: Sherian Rein 08/22/2010 09:02:10  _____________________________________________________________________  External Attachment:    Type:   Image     Comment:   External Document

## 2010-10-02 NOTE — Progress Notes (Signed)
Summary: PA-Januvia  Phone Note From Pharmacy   Summary of Call: PA-Januvia called [employer] @ [phone], awaiting  form. Dagoberto Reef  August 14, 2010 4:28 PM Form to Dr Jonny Ruiz to complete. Dagoberto Reef  August 14, 2010 4:45 PM  PA faxed to [facility] @ [phone], awaiting approval.  PA Approved until 08/17/12, pt aware . Dagoberto Reef  August 18, 2010 9:21 AM  Initial call taken by: Dagoberto Reef,  August 18, 2010 4:49 PM

## 2010-10-24 ENCOUNTER — Other Ambulatory Visit: Payer: Self-pay

## 2010-10-28 ENCOUNTER — Other Ambulatory Visit: Payer: Self-pay | Admitting: Internal Medicine

## 2010-10-28 ENCOUNTER — Other Ambulatory Visit: Payer: Self-pay

## 2010-10-28 ENCOUNTER — Encounter (INDEPENDENT_AMBULATORY_CARE_PROVIDER_SITE_OTHER): Payer: Self-pay | Admitting: *Deleted

## 2010-10-28 DIAGNOSIS — E1165 Type 2 diabetes mellitus with hyperglycemia: Secondary | ICD-10-CM

## 2010-10-28 LAB — LIPID PANEL
HDL: 40.4 mg/dL (ref >39.00)
LDL Cholesterol: 91 mg/dL (ref 0–99)
Total CHOL/HDL Ratio: 4
VLDL: 19 mg/dL (ref 0.0–40.0)

## 2010-10-28 LAB — BASIC METABOLIC PANEL
CO2: 26 mEq/L (ref 19–32)
Chloride: 98 mEq/L (ref 96–112)
GFR: 43.18 mL/min — ABNORMAL LOW (ref >60.00)
Sodium: 135 mEq/L (ref 135–145)

## 2010-10-31 ENCOUNTER — Encounter: Payer: Self-pay | Admitting: Internal Medicine

## 2010-10-31 ENCOUNTER — Ambulatory Visit (INDEPENDENT_AMBULATORY_CARE_PROVIDER_SITE_OTHER): Payer: Medicare PPO | Admitting: Internal Medicine

## 2010-10-31 DIAGNOSIS — E119 Type 2 diabetes mellitus without complications: Secondary | ICD-10-CM

## 2010-10-31 DIAGNOSIS — N259 Disorder resulting from impaired renal tubular function, unspecified: Secondary | ICD-10-CM

## 2010-10-31 DIAGNOSIS — E785 Hyperlipidemia, unspecified: Secondary | ICD-10-CM

## 2010-10-31 DIAGNOSIS — I10 Essential (primary) hypertension: Secondary | ICD-10-CM

## 2010-11-06 NOTE — Assessment & Plan Note (Addendum)
Summary: 4 MO ROV /NWS #   Vital Signs:  Patient profile:   70 year old male Height:      74 inches Weight:      208.38 pounds BMI:     26.85 O2 Sat:      96 % on Room air Temp:     97.9 degrees F oral Pulse rate:   66 / minute BP sitting:   112 / 72  (left arm) Cuff size:   large  Vitals Entered By: Zella Ball Ewing CMA (AAMA) (October 31, 2010 1:24 PM)  O2 Flow:  Room air  CC: 4 month ROV/RE   CC:  4 month ROV/RE.  History of Present Illness: here to f/u;  has incontrolled blood sugar overall, pt has fiarly consistent low blood sugars in the AM between 70 and 100 with symtpoms, such that he often does not take the whole 65 units lantus per day;  sugars during the day range over 200 often; Pt denies CP, worsening sob, doe, wheezing, orthopnea, pnd, worsening LE edema, palps, dizziness or syncope  Pt denies new neuro symptoms such as headache, facial or extremity weakness  Pt denies polydipsia, polyuria  Overall good compliance with meds, trying to follow low chol, DM diet, wt stable, little excercise however  .  Overall good compliance with meds, and good tolerability.  No fever, wt loss, night sweats, loss of appetite or other constitutional symptoms  Denies worsening depressive symptoms, suicidal ideation, or panic.    Problems Prior to Update: 1)  Psa, Increased  (ICD-790.93) 2)  Preventive Health Care  (ICD-V70.0) 3)  Preventive Health Care  (ICD-V70.0) 4)  Gout  (ICD-274.9) 5)  Renal Insufficiency  (ICD-588.9) 6)  Hypertension  (ICD-401.9) 7)  Hyperlipidemia  (ICD-272.4) 8)  Diabetes Mellitus, Type II  (ICD-250.00) 9)  Obesity  (ICD-278.00) 10)  Erectile Dysfunction  (ICD-302.72)  Medications Prior to Update: 1)  Diltiazem Hcl Er Beads 240 Mg  Cp24 (Diltiazem Hcl Er Beads) .[name].. 1 By Mouth Once Daily 2)  Lantus 100 Unit/ml Soln (Insulin Glargine) .... Inject 65 Unit Subcutaneously Once A Day 3)  Lovastatin 40 Mg Tabs (Lovastatin) .... Take 1 Tablet By Mouth Once A Day 4)   Lisinopril-Hydrochlorothiazide 20-12.5 Mg Tabs (Lisinopril-Hydrochlorothiazide) .... 2 By Mouth Once Daily 5)  Ecotrin Low Strength 81 Mg  Tbec (Aspirin) .[name].. 1po Once Daily 6)  Januvia 100 Mg Tabs (Sitagliptin Phosphate) .[name].. 1po Once Daily 7)  Pain Med Per Renal 8)  Levitra 20 Mg Tabs (Vardenafil Hcl) .[name].. 1 By Mouth Once Daily As Needed  Current Medications (verified): 1)  Diltiazem Hcl Er Beads 240 Mg  Cp24 (Diltiazem Hcl Er Beads) .[name].. 1 By Mouth Once Daily 2)  Lantus 100 Unit/ml Soln (Insulin Glargine) .... Inject 50 Unit Subcutaneously Once A Day 3)  Lovastatin 40 Mg Tabs (Lovastatin) .... Take 1 Tablet By Mouth Once A Day 4)  Lisinopril-Hydrochlorothiazide 20-12.5 Mg Tabs (Lisinopril-Hydrochlorothiazide) .... 2 By Mouth Once Daily 5)  Ecotrin Low Strength 81 Mg  Tbec (Aspirin) .[name].. 1po Once Daily 6)  Pain Med Per Renal 7)  Levitra 20 Mg Tabs (Vardenafil Hcl) .[name].. 1 By Mouth Once Daily As Needed 8)  Gout Med Per Renal .... 1 By Mouth Two Times A Day 9)  Humalog Kwikpen 100 Unit/ml Soln (Insulin Lispro (Human)) .... Use Asd Ssi  - Up To 10 Units With Meals and Before Bedtime 10)  Lantus Solostar Needle .... Use Asd 1 Once Daily 11)  Humalog Kwikpen  Needles .... Use Asd Four Times Per Day (Ac and At Bedtime)  Allergies (verified): 1)  Nsaids  Past History:  Past Medical History: Last updated: 06/30/2007 Erectile Dysfunction Obesity Diabetes mellitus, type II Hyperlipidemia Hypertension Renal insufficiency Gout  Past Surgical History: Last updated: 06/30/2007 none  Social History: Last updated: 07/01/2010 Former Smoker Alcohol use-no Married 6 children retired since 2005 - [employer] Drug use-no  Risk Factors: Smoking Status: quit (06/30/2007)  Review of Systems       all otherwise negative per pt -    Physical Exam  General:  alert and overweight-appearing.   Head:  normocephalic and atraumatic.   Eyes:  vision grossly intact, pupils equal, and  pupils round.   Ears:  R ear normal and L ear normal.   Nose:  no external deformity and no nasal discharge.   Mouth:  no gingival abnormalities and pharynx pink and moist.   Neck:  supple and no masses.   Lungs:  normal respiratory effort and normal breath sounds.   Heart:  normal rate and regular rhythm.   Extremities:  no edema, no erythema    Impression & Recommendations:  Problem # 1:  DIABETES MELLITUS, TYPE II (ICD-250.00)  The following medications were removed from the medication list:    Januvia 100 Mg Tabs (Sitagliptin phosphate) .[name]... 1po once daily His updated medication list for this problem includes:    Lantus 100 Unit/ml Soln (Insulin glargine) ..... Inject 50 unit subcutaneously once a day    Lisinopril-hydrochlorothiazide 20-12.5 Mg Tabs (Lisinopril-hydrochlorothiazide) .[name]... 2 by mouth once daily    Ecotrin Low Strength 81 Mg Tbec (Aspirin) .[name]... 1po once daily    Humalog Kwikpen 100 Unit/ml Soln (Insulin lispro (human)) ..... Use asd ssi  - up to 10 units with meals and before bedtime poor controlled in the face of renal insufficiency and low sugars limiting his overall dose lantus;  will decrease the lantus to 50 units per day; d/c the januvia, and start humalog kwikpen SSI  with cbg' ac and qhs  Labs Reviewed: Creat: 2.0 (10/28/2010)    Reviewed HgBA1c results: 9.7 (10/28/2010)  8.8 (08/12/2010)  Problem # 2:  HYPERTENSION (ICD-401.9)  His updated medication list for this problem includes:    Diltiazem Hcl Er Beads 240 Mg Cp24 (Diltiazem hcl er beads) .[name]... 1 by mouth once daily    Lisinopril-hydrochlorothiazide 20-12.5 Mg Tabs (Lisinopril-hydrochlorothiazide) .[name]... 2 by mouth once daily  BP today: 112/72 Prior BP: 152/78 (07/01/2010)  Labs Reviewed: K+: 4.0 (10/28/2010) Creat: : 2.0 (10/28/2010)   Chol: 150 (10/28/2010)   HDL: 40.40 (10/28/2010)   LDL: 91 (10/28/2010)   TG: 95.0 (10/28/2010) stable overall by hx and exam, ok to continue meds/tx as is    Problem # 3:  HYPERLIPIDEMIA (ICD-272.4)  His updated medication list for this problem includes:    Lovastatin 40 Mg Tabs (Lovastatin) .[name]... Take 1 tablet by mouth once a day  Labs Reviewed: SGOT: 29 (06/25/2010)   SGPT: 20 (06/25/2010)   HDL:40.40 (10/28/2010), 41.40 (06/25/2010)  LDL:91 (10/28/2010), 91 (06/25/2010)  Chol:150 (10/28/2010), 159 (06/25/2010)  Trig:95.0 (10/28/2010), 135.0 (06/25/2010) stable overall by hx and exam, ok to continue meds/tx as is   Problem # 4:  RENAL INSUFFICIENCY (ICD-588.9) overall stable, I faxed results to Dr patel/renal;  follow closely;  pt has f/u appt with renal in 2 mo  Complete Medication List: 1)  Diltiazem Hcl Er Beads 240 Mg Cp24 (Diltiazem hcl er beads) .[name].. 1 by mouth once  daily 2)  Lantus 100 Unit/ml Soln (Insulin glargine) .... Inject 50 unit subcutaneously once a day 3)  Lovastatin 40 Mg Tabs (Lovastatin) .... Take 1 tablet by mouth once a day 4)  Lisinopril-hydrochlorothiazide 20-12.5 Mg Tabs (Lisinopril-hydrochlorothiazide) .... 2 by mouth once daily 5)  Ecotrin Low Strength 81 Mg Tbec (Aspirin) .[name].. 1po once daily 6)  Pain Med Per Renal  7)  Levitra 20 Mg Tabs (Vardenafil hcl) .[name].. 1 by mouth once daily as needed 8)  Gout Med Per Renal  .... 1 by mouth two times a day 9)  Humalog Kwikpen 100 Unit/ml Soln (Insulin lispro (human)) .... Use asd ssi  - up to 10 units with meals and before bedtime 10)  Lantus Solostar Needle  .... Use asd 1 once daily 11)  Humalog Kwikpen Needles  .... Use asd four times per day (ac and at bedtime)  Patient Instructions: 1)  stop the januvia 2)  decrease the lanuts to 50 units 3)  please start checking your blood sugars before each meal and before bedtime (a total of 4 times per day) 4)  start the humalog kwikpen with "sliding scale"  : 5)       < 150  none 6)       151 - 200   -     2 units 7)       201 - 250   -     4 units 8)       251 - 300  -      6 units 9)       301 - 350   -     8  units 10)          > 350            10 units 11)  If your blood sugar is consistently more than 350 , please call the office so that we can adjust the lantus or the sliding scale 12)  Please call with your blood sugars in 1 wk to let us know how you are doing 13)  You are given the sample oif Humalog, and the Coupon for the Free Month of Humalog as well 14)  Please schedule a follow-up appointment in 2 months with: 15)  BMP prior to visit, ICD-9: 250.02 16)  Lipid Panel prior to visit, ICD-9: 17)  HbgA1C prior to visit, ICD-9: Prescriptions: HUMALOG KWIKPEN 100 UNIT/ML SOLN (INSULIN LISPRO (HUMAN)) use asd SSI  - up to 10 units with meals and before bedtime  #5pens x 11   Entered and Authorized by:   Corwin Levins MD   Signed by:   Corwin Levins MD on 10/31/2010   Method used:   Print then Give to Patient   RxID:   [id number] LANTUS 100 UNIT/ML SOLN (INSULIN GLARGINE) Inject 50 unit subcutaneously once a day  #5pens x 11   Entered and Authorized by:   Corwin Levins MD   Signed by:   Corwin Levins MD on 10/31/2010   Method used:   Print then Give to Patient   RxID:   [id number] LANTUS 100 UNIT/ML SOLN (INSULIN GLARGINE) Inject 50 unit subcutaneously once a day  #5 pens x 11   Entered and Authorized by:   Corwin Levins MD   Signed by:   Corwin Levins MD on 10/31/2010   Method used:   Print then Give to Patient   RxID:   [id number] HUMALOG KWIKPEN NEEDLES  USE ASD four times per day (ac and at bedtime)  #400 x 11   Entered and Authorized by:   Corwin Levins MD   Signed by:   Corwin Levins MD on 10/31/2010   Method used:   Print then Give to Patient   RxID:   [id number] LANTUS SOLOSTAR NEEDLE use asd 1 once daily  #100 x 11   Entered and Authorized by:   Corwin Levins MD   Signed by:   Corwin Levins MD on 10/31/2010   Method used:   Print then Give to Patient   RxID:   [id number] LANTUS SOLOSTART NEEDLE use asd 1 once daily  #100 x 11   Entered and Authorized by:    Corwin Levins MD   Signed by:   Corwin Levins MD on 10/31/2010   Method used:   Print then Give to Patient   RxID:   [id number] HUMALOG KWIKPEN 100 UNIT/ML SOLN (INSULIN LISPRO (HUMAN)) use asd SSI  - up to 10 units with meals and before bedtime  #5 pens x 11   Entered and Authorized by:   Corwin Levins MD   Signed by:   Corwin Levins MD on 10/31/2010   Method used:   Print then Give to Patient   RxID:   [id number]    Orders Added: 1)  Est. Patient Level IV [63875]  Appended Document: 4 MO ROV /NWS # received note regarding Blood sugars  -   CBG;s most recently 120, 95, 144, 129  OK to cont same treatment  robin - to call pt to inform  Appended Document: 4 MO ROV /NWS # called pt informed of above information

## 2010-11-10 ENCOUNTER — Encounter: Payer: Self-pay | Admitting: Internal Medicine

## 2010-11-18 NOTE — Letter (Signed)
Summary: Blood Sugar readings/Patient  Blood Sugar readings/Patient   Imported By: Sherian Rein 11/14/2010 12:55:33  _____________________________________________________________________  External Attachment:    Type:   Image     Comment:   External Document

## 2010-12-25 ENCOUNTER — Other Ambulatory Visit: Payer: Medicare PPO

## 2010-12-25 ENCOUNTER — Other Ambulatory Visit: Payer: Self-pay | Admitting: Internal Medicine

## 2010-12-28 ENCOUNTER — Encounter: Payer: Self-pay | Admitting: Internal Medicine

## 2010-12-28 DIAGNOSIS — Z0000 Encounter for general adult medical examination without abnormal findings: Secondary | ICD-10-CM | POA: Insufficient documentation

## 2010-12-28 HISTORY — DX: Encounter for general adult medical examination without abnormal findings: Z00.00

## 2010-12-30 ENCOUNTER — Other Ambulatory Visit (INDEPENDENT_AMBULATORY_CARE_PROVIDER_SITE_OTHER): Payer: Medicare PPO

## 2010-12-30 ENCOUNTER — Other Ambulatory Visit (INDEPENDENT_AMBULATORY_CARE_PROVIDER_SITE_OTHER): Payer: Medicare PPO | Admitting: Internal Medicine

## 2010-12-30 DIAGNOSIS — Z13220 Encounter for screening for lipoid disorders: Secondary | ICD-10-CM

## 2010-12-30 DIAGNOSIS — E785 Hyperlipidemia, unspecified: Secondary | ICD-10-CM

## 2010-12-30 DIAGNOSIS — E1165 Type 2 diabetes mellitus with hyperglycemia: Secondary | ICD-10-CM

## 2010-12-30 LAB — LIPID PANEL
HDL: 34.2 mg/dL — ABNORMAL LOW (ref >39.00)
Triglycerides: 311 mg/dL — ABNORMAL HIGH (ref 0.0–149.0)
VLDL: 62.2 mg/dL — ABNORMAL HIGH (ref 0.0–40.0)

## 2010-12-30 LAB — BASIC METABOLIC PANEL
Creatinine, Ser: 2.1 mg/dL — ABNORMAL HIGH (ref 0.4–1.5)
GFR: 39.87 mL/min — ABNORMAL LOW (ref >60.00)
Potassium: 4.1 mEq/L (ref 3.5–5.1)

## 2011-01-01 ENCOUNTER — Ambulatory Visit (INDEPENDENT_AMBULATORY_CARE_PROVIDER_SITE_OTHER): Payer: Medicare PPO | Admitting: Internal Medicine

## 2011-01-01 ENCOUNTER — Encounter: Payer: Self-pay | Admitting: Internal Medicine

## 2011-01-01 DIAGNOSIS — E785 Hyperlipidemia, unspecified: Secondary | ICD-10-CM

## 2011-01-01 DIAGNOSIS — Z0000 Encounter for general adult medical examination without abnormal findings: Secondary | ICD-10-CM

## 2011-01-01 DIAGNOSIS — N259 Disorder resulting from impaired renal tubular function, unspecified: Secondary | ICD-10-CM

## 2011-01-01 DIAGNOSIS — E119 Type 2 diabetes mellitus without complications: Secondary | ICD-10-CM

## 2011-01-01 DIAGNOSIS — I10 Essential (primary) hypertension: Secondary | ICD-10-CM

## 2011-01-01 NOTE — Assessment & Plan Note (Signed)
stable overall by hx and exam, most recent lab reviewed with pt, and pt to continue medical treatment as before  BP Readings from Last 3 Encounters:  01/01/11 134/70  10/31/10 112/72  07/01/10 152/78

## 2011-01-01 NOTE — Progress Notes (Signed)
  Subjective:    Patient ID: Eric Wade, male    DOB: November 27, 1940, 70 y.o.   MRN: [id number]  HPI Here to f/u; overall doing ok,  Pt denies chest pain, increased sob or doe, wheezing, orthopnea, PND, increased LE swelling, palpitations, dizziness or syncope.  Pt denies new neurological symptoms such as new headache, or facial or extremity weakness or numbness   Pt denies polydipsia, polyuria, bur has several  low sugar symptoms such as weakness or confusion improved with po intake due to variable po intake and insulin admin..  Pt states overall o/w good compliance with meds, trying to follow lower cholesterol, diabetic diet, wt overall stable but little exercise however.   Past Medical History  Diagnosis Date  . DIABETES MELLITUS, TYPE II 05/08/2007  . HYPERLIPIDEMIA 05/08/2007  . GOUT 07/01/2007  . OBESITY 05/08/2007  . ERECTILE DYSFUNCTION 05/08/2007  . HYPERTENSION 05/08/2007  . RENAL INSUFFICIENCY 07/01/2007  . PSA, INCREASED 07/02/2008  . Preventative health care 12/28/2010   No past surgical history on file.  reports that he has quit smoking. He does not have any smokeless tobacco history on file. He reports that he does not drink alcohol or use illicit drugs. family history includes Hypertension in his other. Allergies  Allergen Reactions  . Nsaids     REACTION: renal insufficiency   Current Outpatient Prescriptions on File Prior to Visit  Medication Sig Dispense Refill  . aspirin 81 MG EC tablet Take 81 mg by mouth daily.        [name] diltiazem (DILACOR XR) 240 MG 24 hr capsule Take 240 mg by mouth daily.        . insulin lispro (HUMALOG KWIKPEN) 100 UNIT/ML injection Use as directed SSI up to 10 units with meals and before bedtime.       [name] lisinopril-hydrochlorothiazide (PRINZIDE,ZESTORETIC) 20-12.5 MG per tablet Take 1 tablet by mouth 2 (two) times daily.        [name] lovastatin (MEVACOR) 40 MG tablet Take 40 mg by mouth daily.        . vardenafil (LEVITRA) 20 MG tablet Take 20 mg by mouth  daily as needed.        . insulin glargine (LANTUS) 100 UNIT/ML injection Inject 50 Units into the skin daily.         Review of Systems All otherwise neg per pt     Objective:   Physical Exam BP 134/70  Pulse 66  Temp(Src) 97.9 F (36.6 C) (Oral)  Ht 6\' 2"  (1.88 m)  Wt 207 lb (93.895 kg)  BMI 26.58 kg/m2  SpO2 95% Physical Exam  VS noted Constitutional: Pt appears well-developed and well-nourished.  HENT: Head: Normocephalic.  Right Ear: External ear normal.  Left Ear: External ear normal.  Eyes: Conjunctivae and EOM are normal. Pupils are equal, round, and reactive to light.  Neck: Normal range of motion. Neck supple.  Cardiovascular: Normal rate and regular rhythm.   Pulmonary/Chest: Effort normal and breath sounds normal.  Abd:  Soft, NT, non-distended, + BS Neurological: Pt is alert. No cranial nerve deficit.  Skin: Skin is warm. No erythema. No edema Psychiatric: Pt behavior is normal. Thought content normal.        Assessment & Plan:

## 2011-01-01 NOTE — Assessment & Plan Note (Signed)
stable overall by hx and exam, most recent lab reviewed with pt, and pt to continue medical treatment as before  Lab Results  Component Value Date   LDLCALC 91 10/28/2010

## 2011-01-01 NOTE — Patient Instructions (Signed)
Continue all other medications as before Please keep your appointments with your specialists as you have planned - Dr Allena Katz Please return in 6 mo with Lab testing done 3-5 days before

## 2011-01-01 NOTE — Assessment & Plan Note (Signed)
stable overall by hx and exam, most recent lab reviewed with pt, and pt to continue medical treatment as before, volume OK today, has regular f/u with Dr patel/renal as well  Lab Results  Component Value Date   WBC 6.3 06/25/2010   HGB 16.6 06/25/2010   HCT 47.4 06/25/2010   PLT 173.0 06/25/2010   CHOL 136 12/30/2010   TRIG 311.0* 12/30/2010   HDL 34.20* 12/30/2010   LDLDIRECT 72.5 12/30/2010   ALT 20 06/25/2010   AST 29 06/25/2010   NA 139 12/30/2010   K 4.1 12/30/2010   CL 104 12/30/2010   CREATININE 2.1* 12/30/2010   BUN 29* 12/30/2010   CO2 28 12/30/2010   TSH 1.20 06/25/2010   PSA 5.98* 06/25/2010   HGBA1C 8.6* 12/30/2010   MICROALBUR 3.2* 06/25/2010

## 2011-01-01 NOTE — Assessment & Plan Note (Signed)
Improved but still uncontrolled, with variable diet and insulin admin appears to be about as good as can be expected, declines endo or dietary referral at this time, Continue all other medications as before, f/u labs next visit  Lab Results  Component Value Date   HGBA1C 8.6* 12/30/2010

## 2011-01-13 ENCOUNTER — Other Ambulatory Visit: Payer: Self-pay

## 2011-01-13 ENCOUNTER — Encounter: Payer: Self-pay | Admitting: Internal Medicine

## 2011-01-13 MED ORDER — LOVASTATIN 40 MG PO TABS: 40.0000 mg | ORAL_TABLET | Freq: Every day | ORAL | Status: DC

## 2011-01-13 MED ORDER — DILTIAZEM HCL ER 240 MG PO CP24: 240.0000 mg | ORAL_CAPSULE | Freq: Every day | ORAL | Status: DC

## 2011-01-13 MED ORDER — LISINOPRIL-HYDROCHLOROTHIAZIDE 20-12.5 MG PO TABS: 1.0000 | ORAL_TABLET | Freq: Two times a day (BID) | ORAL | Status: DC

## 2011-05-22 ENCOUNTER — Other Ambulatory Visit: Payer: Self-pay | Admitting: Internal Medicine

## 2011-05-22 NOTE — Telephone Encounter (Signed)
Done per emr 

## 2011-07-03 ENCOUNTER — Other Ambulatory Visit (INDEPENDENT_AMBULATORY_CARE_PROVIDER_SITE_OTHER): Payer: Medicare PPO

## 2011-07-03 DIAGNOSIS — Z125 Encounter for screening for malignant neoplasm of prostate: Secondary | ICD-10-CM

## 2011-07-03 DIAGNOSIS — Z0000 Encounter for general adult medical examination without abnormal findings: Secondary | ICD-10-CM

## 2011-07-03 DIAGNOSIS — E119 Type 2 diabetes mellitus without complications: Secondary | ICD-10-CM

## 2011-07-03 LAB — MICROALBUMIN / CREATININE URINE RATIO
Creatinine,U: 129.1 mg/dL
Microalb Creat Ratio: 1.5 mg/g (ref 0.0–30.0)

## 2011-07-03 LAB — URINALYSIS, ROUTINE W REFLEX MICROSCOPIC
Leukocytes, UA: NEGATIVE
Nitrite: NEGATIVE
Specific Gravity, Urine: 1.02 (ref 1.000–1.030)
Urobilinogen, UA: 0.2 (ref 0.0–1.0)
pH: 5.5 (ref 5.0–8.0)

## 2011-07-03 LAB — CBC WITH DIFFERENTIAL/PLATELET
Basophils Absolute: 0 10*3/uL (ref 0.0–0.1)
Eosinophils Relative: 1.8 % (ref 0.0–5.0)
HCT: 49.9 % (ref 39.0–52.0)
Hemoglobin: 17.1 g/dL — ABNORMAL HIGH (ref 13.0–17.0)
Lymphocytes Relative: 35.4 % (ref 12.0–46.0)
Lymphs Abs: 2.3 10*3/uL (ref 0.7–4.0)
Monocytes Relative: 11 % (ref 3.0–12.0)
Platelets: 165 10*3/uL (ref 150.0–400.0)
RDW: 13.1 % (ref 11.5–14.6)
WBC: 6.4 10*3/uL (ref 4.5–10.5)

## 2011-07-03 LAB — BASIC METABOLIC PANEL
BUN: 32 mg/dL — ABNORMAL HIGH (ref 6–23)
Calcium: 9.6 mg/dL (ref 8.4–10.5)
GFR: 36.78 mL/min — ABNORMAL LOW (ref >60.00)
Glucose, Bld: 247 mg/dL — ABNORMAL HIGH (ref 70–99)
Potassium: 4.1 mEq/L (ref 3.5–5.1)
Sodium: 135 mEq/L (ref 135–145)

## 2011-07-03 LAB — HEPATIC FUNCTION PANEL
ALT: 29 U/L (ref 0–53)
AST: 35 U/L (ref 0–37)
Total Protein: 7.6 g/dL (ref 6.0–8.3)

## 2011-07-03 LAB — LIPID PANEL
Cholesterol: 142 mg/dL (ref 0–200)
HDL: 38.8 mg/dL — ABNORMAL LOW (ref >39.00)
VLDL: 26.8 mg/dL (ref 0.0–40.0)

## 2011-07-03 LAB — TSH: TSH: 0.9 u[IU]/mL (ref 0.35–5.50)

## 2011-07-07 ENCOUNTER — Encounter: Payer: Self-pay | Admitting: Internal Medicine

## 2011-07-07 ENCOUNTER — Ambulatory Visit (INDEPENDENT_AMBULATORY_CARE_PROVIDER_SITE_OTHER): Payer: Medicare PPO | Admitting: Internal Medicine

## 2011-07-07 VITALS — BP 130/60 | HR 60 | Temp 97.6°F | Ht 74.0 in | Wt 209.4 lb

## 2011-07-07 DIAGNOSIS — Z0000 Encounter for general adult medical examination without abnormal findings: Secondary | ICD-10-CM

## 2011-07-07 DIAGNOSIS — E119 Type 2 diabetes mellitus without complications: Secondary | ICD-10-CM

## 2011-07-07 DIAGNOSIS — Z23 Encounter for immunization: Secondary | ICD-10-CM

## 2011-07-07 DIAGNOSIS — R972 Elevated prostate specific antigen [PSA]: Secondary | ICD-10-CM

## 2011-07-07 MED ORDER — SITAGLIPTIN PHOSPHATE 50 MG PO TABS: 50.0000 mg | ORAL_TABLET | Freq: Every day | ORAL | Status: DC

## 2011-07-07 NOTE — Assessment & Plan Note (Signed)
Worsened, and pt has yet to see urology as planned previously (has sick daughter, mult other stressors);  Encouraged pt to see urology - will refer

## 2011-07-07 NOTE — Assessment & Plan Note (Signed)

## 2011-07-07 NOTE — Assessment & Plan Note (Signed)
Overall control improved , but still with variable diet, and does not want to take the humalog in addition to the lantus, has occasional low sugars in the AM even on the lantus;  Ok to add [employer]

## 2011-07-07 NOTE — Patient Instructions (Addendum)
You had the flu shot today You will be contacted regarding the referral for: urology Your recent blood work will be faxed to Dr Allena Katz, as well as [facility] Take all new medications as prescribed - the januvia at 50 mg per day Continue all other medications as before (except stop the humalog as you have) You are otherwise up to date today Please return in 6 mo with Lab testing done 3-5 days before

## 2011-07-12 ENCOUNTER — Encounter: Payer: Self-pay | Admitting: Internal Medicine

## 2011-07-12 NOTE — Progress Notes (Signed)
Subjective:    Patient ID: Eric Wade, male    DOB: May 17, 1941, 70 y.o.   MRN: [id number]  HPI  Here for wellness and f/u;  Overall doing ok;  Pt denies CP, worsening SOB, DOE, wheezing, orthopnea, PND, worsening LE edema, palpitations, dizziness or syncope.  Pt denies neurological change such as new Headache, facial or extremity weakness.  Pt denies polydipsia, polyuria, or low sugar symptoms. Pt states overall good compliance with treatment and medications, good tolerability, and trying to follow lower cholesterol diet.  Pt denies worsening depressive symptoms, suicidal ideation or panic. No fever, wt loss, night sweats, loss of appetite, or other constitutional symptoms.  Pt states good ability with ADL's, low fall risk, home safety reviewed and adequate, no significant changes in hearing or vision, and occasionally active with exercise. CBG's in the low 100's. Past Medical History  Diagnosis Date  . DIABETES MELLITUS, TYPE II 05/08/2007  . HYPERLIPIDEMIA 05/08/2007  . GOUT 07/01/2007  . OBESITY 05/08/2007  . ERECTILE DYSFUNCTION 05/08/2007  . HYPERTENSION 05/08/2007  . RENAL INSUFFICIENCY 07/01/2007  . PSA, INCREASED 07/02/2008  . Preventative health care 12/28/2010   No past surgical history on file.  reports that he has quit smoking. He does not have any smokeless tobacco history on file. He reports that he does not drink alcohol or use illicit drugs. family history includes Hypertension in his other. Allergies  Allergen Reactions  . Nsaids     REACTION: renal insufficiency   Current Outpatient Prescriptions on File Prior to Visit  Medication Sig Dispense Refill  . aspirin 81 MG EC tablet Take 81 mg by mouth daily.        [name] DILT-XR 240 MG 24 hr capsule TAKE 1 CAPSULE DAILY  90 each  3  . insulin glargine (LANTUS) 100 UNIT/ML injection Inject 50 Units into the skin daily.        [name] lisinopril-hydrochlorothiazide (PRINZIDE,ZESTORETIC) 20-12.5 MG per tablet TAKE 1 TABLET TWICE DAILY  180  tablet  3  . lovastatin (MEVACOR) 40 MG tablet Take 1 tablet (40 mg total) by mouth daily.  90 tablet  1  . traMADol (ULTRAM) 50 MG tablet TAKE 1 TABLET EVERY 6 HOURS AS NEEDED FOR PAIN  60 tablet  1  . vardenafil (LEVITRA) 20 MG tablet Take 20 mg by mouth daily as needed.         Review of Systems Review of Systems  Constitutional: Negative for diaphoresis, activity change, appetite change and unexpected weight change.  HENT: Negative for hearing loss, ear pain, facial swelling, mouth sores and neck stiffness.   Eyes: Negative for pain, redness and visual disturbance.  Respiratory: Negative for shortness of breath and wheezing.   Cardiovascular: Negative for chest pain and palpitations.  Gastrointestinal: Negative for diarrhea, blood in stool, abdominal distention and rectal pain.  Genitourinary: Negative for hematuria, flank pain and decreased urine volume.  Musculoskeletal: Negative for myalgias and joint swelling.  Skin: Negative for color change and wound.  Neurological: Negative for syncope and numbness.  Hematological: Negative for adenopathy.  Psychiatric/Behavioral: Negative for hallucinations, self-injury, decreased concentration and agitation.      Objective:   Physical Exam BP 130/60  Pulse 60  Temp(Src) 97.6 F (36.4 C) (Oral)  Ht 6\' 2"  (1.88 m)  Wt 209 lb 6 oz (94.972 kg)  BMI 26.88 kg/m2  SpO2 96% Physical Exam  VS noted Constitutional: Pt is oriented to person, place, and time. Appears well-developed and well-nourished.  HENT:  Head: Normocephalic  and atraumatic.  Right Ear: External ear normal.  Left Ear: External ear normal.  Nose: Nose normal.  Mouth/Throat: Oropharynx is clear and moist.  Eyes: Conjunctivae and EOM are normal. Pupils are equal, round, and reactive to light.  Neck: Normal range of motion. Neck supple. No JVD present. No tracheal deviation present.  Cardiovascular: Normal rate, regular rhythm, normal heart sounds and intact distal pulses.     Pulmonary/Chest: Effort normal and breath sounds normal.  Abdominal: Soft. Bowel sounds are normal. There is no tenderness.  Musculoskeletal: Normal range of motion. Exhibits no edema.  Lymphadenopathy:  Has no cervical adenopathy.  Neurological: Pt is alert and oriented to person, place, and time. Pt has normal reflexes. No cranial nerve deficit.  Skin: Skin is warm and dry. No rash noted.  Psychiatric:  Has  normal mood and affect. Behavior is normal.     Assessment & Plan:

## 2011-07-14 ENCOUNTER — Other Ambulatory Visit: Payer: Self-pay | Admitting: Internal Medicine

## 2011-09-30 ENCOUNTER — Other Ambulatory Visit: Payer: Self-pay | Admitting: Internal Medicine

## 2011-09-30 MED ORDER — INSULIN GLARGINE 100 UNIT/ML ~~LOC~~ SOLN: 50.0000 [IU] | Freq: Every day | SUBCUTANEOUS | Status: DC

## 2011-10-02 ENCOUNTER — Telehealth: Payer: Self-pay

## 2011-10-02 NOTE — Telephone Encounter (Signed)
Called the patient left a message that patient assistance form has been completed by [facility] and is ready for pickup at the front desk. Called the patient several times and left messages to pickup form.

## 2011-10-12 ENCOUNTER — Other Ambulatory Visit: Payer: Self-pay

## 2011-10-12 MED ORDER — DILTIAZEM HCL ER 240 MG PO CP24: 240.0000 mg | ORAL_CAPSULE | Freq: Every day | ORAL | Status: DC

## 2011-10-12 MED ORDER — TRAMADOL HCL 50 MG PO TABS: 50.0000 mg | ORAL_TABLET | Freq: Two times a day (BID) | ORAL | Status: DC | PRN

## 2011-10-12 MED ORDER — LISINOPRIL-HYDROCHLOROTHIAZIDE 20-12.5 MG PO TABS: 1.0000 | ORAL_TABLET | Freq: Two times a day (BID) | ORAL | Status: DC

## 2011-10-12 NOTE — Telephone Encounter (Signed)
All done per emr

## 2011-12-11 ENCOUNTER — Other Ambulatory Visit: Payer: Self-pay

## 2011-12-11 MED ORDER — INSULIN GLARGINE 100 UNIT/ML ~~LOC~~ SOLN: 50.0000 [IU] | Freq: Every day | SUBCUTANEOUS | Status: DC

## 2011-12-17 ENCOUNTER — Other Ambulatory Visit: Payer: Self-pay | Admitting: Internal Medicine

## 2011-12-17 NOTE — Telephone Encounter (Signed)
Done per emr 

## 2012-01-01 ENCOUNTER — Other Ambulatory Visit (INDEPENDENT_AMBULATORY_CARE_PROVIDER_SITE_OTHER): Payer: Medicare Other

## 2012-01-01 DIAGNOSIS — E119 Type 2 diabetes mellitus without complications: Secondary | ICD-10-CM

## 2012-01-01 LAB — BASIC METABOLIC PANEL
Chloride: 97 mEq/L (ref 96–112)
GFR: 40.2 mL/min — ABNORMAL LOW (ref >60.00)
Glucose, Bld: 187 mg/dL — ABNORMAL HIGH (ref 70–99)
Potassium: 4.2 mEq/L (ref 3.5–5.1)
Sodium: 136 mEq/L (ref 135–145)

## 2012-01-01 LAB — LIPID PANEL
Cholesterol: 139 mg/dL (ref 0–200)
VLDL: 24.2 mg/dL (ref 0.0–40.0)

## 2012-01-01 LAB — HEMOGLOBIN A1C: Hgb A1c MFr Bld: 9.4 % — ABNORMAL HIGH (ref 4.6–6.5)

## 2012-01-05 ENCOUNTER — Encounter: Payer: Self-pay | Admitting: Internal Medicine

## 2012-01-05 ENCOUNTER — Ambulatory Visit (INDEPENDENT_AMBULATORY_CARE_PROVIDER_SITE_OTHER): Payer: Medicare Other | Admitting: Internal Medicine

## 2012-01-05 VITALS — BP 120/62 | HR 73 | Temp 97.5°F | Ht 74.0 in | Wt 209.1 lb

## 2012-01-05 DIAGNOSIS — I10 Essential (primary) hypertension: Secondary | ICD-10-CM

## 2012-01-05 DIAGNOSIS — E119 Type 2 diabetes mellitus without complications: Secondary | ICD-10-CM

## 2012-01-05 DIAGNOSIS — Z0000 Encounter for general adult medical examination without abnormal findings: Secondary | ICD-10-CM

## 2012-01-05 DIAGNOSIS — N259 Disorder resulting from impaired renal tubular function, unspecified: Secondary | ICD-10-CM

## 2012-01-05 DIAGNOSIS — E785 Hyperlipidemia, unspecified: Secondary | ICD-10-CM

## 2012-01-05 MED ORDER — DILTIAZEM HCL ER 240 MG PO CP24: 240.0000 mg | ORAL_CAPSULE | Freq: Every day | ORAL | Status: DC

## 2012-01-05 MED ORDER — LOVASTATIN 40 MG PO TABS: 40.0000 mg | ORAL_TABLET | Freq: Every day | ORAL | Status: DC

## 2012-01-05 MED ORDER — LISINOPRIL-HYDROCHLOROTHIAZIDE 20-12.5 MG PO TABS: 1.0000 | ORAL_TABLET | Freq: Two times a day (BID) | ORAL | Status: DC

## 2012-01-05 MED ORDER — SITAGLIPTIN PHOSPHATE 50 MG PO TABS: 50.0000 mg | ORAL_TABLET | Freq: Every day | ORAL | Status: DC

## 2012-01-05 NOTE — Assessment & Plan Note (Addendum)
Mod to severe uncontrolled overall by hx and exam, most recent data reviewed with pt, and pt to continue medical treatment as before but start the [place] as intended before, needs attention to strict diet, and take lantus daily, at about same time,  Declines diet referral, d/w pt the import of uncontrolled sugar on worsening renal fxn and pt verbalizes he understands Lab Results  Component Value Date   HGBA1C 9.4* 01/01/2012

## 2012-01-05 NOTE — Assessment & Plan Note (Signed)
stable overall by hx and exam, most recent data reviewed with pt, and pt to continue medical treatment as before Lab Results  Component Value Date   LDLCALC 68 01/01/2012

## 2012-01-05 NOTE — Assessment & Plan Note (Signed)
stable overall by hx and exam, most recent data reviewed with pt, and pt to continue medical treatment as before. Volume stable, labs to forwarded to renal as well

## 2012-01-05 NOTE — Assessment & Plan Note (Signed)
stable overall by hx and exam, most recent data reviewed with pt, and pt to continue medical treatment as before BP Readings from Last 3 Encounters:  01/05/12 120/62  07/07/11 130/60  01/01/11 134/70

## 2012-01-05 NOTE — Patient Instructions (Addendum)
Take all new medications as prescribed - the januvia 50 mg Continue all other medications as before Please take the Lantus at about the same time every day Please a stricter Diabetic diet All of your medications were sent to [facility] today You are given a copy of your lab results today We will also send a copy to Dr Allena Katz Please keep your appointments with your specialists as you have planned  - Dr Allena Katz Please return in 6 mo with Lab testing done 3-5 days before

## 2012-01-05 NOTE — Progress Notes (Signed)
Subjective:    Patient ID: Eric Wade, male    DOB: 1941-04-05, 71 y.o.   MRN: [id number]  HPI  Here to f/u; overall doing ok,  Pt denies chest pain, increased sob or doe, wheezing, orthopnea, PND, increased LE swelling, palpitations, dizziness or syncope.  Pt denies new neurological symptoms such as new headache, or facial or extremity weakness or numbness   Pt denies polydipsia, polyuria, or low sugar symptoms such as weakness or confusion improved with po intake, but does admit to some dietary noncompliacne, as well as not taking the lantus the same time of day (usually takes in the AM, but sometimes fogets until the PM), and is not taking the januvia at all - some confusion about this.    Pt states overall good o/w compliance with meds, wt overall stable but little exercise however.  Denies worsening depressive symptoms, suicidal ideation, or panic, though has ongoing anxiety, not increased recently.    Pt denies fever, wt loss, night sweats, loss of appetite, or other constitutional symptoms Past Medical History  Diagnosis Date  . DIABETES MELLITUS, TYPE II 05/08/2007  . HYPERLIPIDEMIA 05/08/2007  . GOUT 07/01/2007  . OBESITY 05/08/2007  . ERECTILE DYSFUNCTION 05/08/2007  . HYPERTENSION 05/08/2007  . RENAL INSUFFICIENCY 07/01/2007  . PSA, INCREASED 07/02/2008  . Preventative health care 12/28/2010   No past surgical history on file.  reports that he has quit smoking. He does not have any smokeless tobacco history on file. He reports that he does not drink alcohol or use illicit drugs. family history includes Hypertension in his other. Allergies  Allergen Reactions  . Nsaids     REACTION: renal insufficiency   Current Outpatient Prescriptions on File Prior to Visit  Medication Sig Dispense Refill  . aspirin 81 MG EC tablet Take 81 mg by mouth daily.        . insulin glargine (LANTUS) 100 UNIT/ML injection Inject 50 Units into the skin daily.  45 mL  3  . traMADol (ULTRAM) 50 MG tablet  Take 1 tablet (50 mg total) by mouth 2 (two) times daily as needed for pain.  60 tablet  1  . DISCONTD: diltiazem (DILT-XR) 240 MG 24 hr capsule Take 1 capsule (240 mg total) by mouth daily.  90 capsule  3  . DISCONTD: lisinopril-hydrochlorothiazide (PRINZIDE,ZESTORETIC) 20-12.5 MG per tablet Take 1 tablet by mouth 2 (two) times daily.  180 tablet  3  . DISCONTD: lovastatin (MEVACOR) 40 MG tablet TAKE 1 TABLET DAILY  90 tablet  3  . DISCONTD: sitaGLIPtin (JANUVIA) 50 MG tablet Take 1 tablet (50 mg total) by mouth daily.  90 tablet  3   Review of Systems Review of Systems  Constitutional: Negative for diaphoresis and unexpected weight change.  Respiratory: Negative for choking and stridor.   Gastrointestinal: Negative for vomiting and blood in stool.  Genitourinary: Negative for hematuria and decreased urine volume.  Musculoskeletal: Negative for gait problem.  Skin: Negative for color change and wound.  Neurological: Negative for tremors and numbness.  Psychiatric/Behavioral: Negative for decreased concentration. The patient is not hyperactive.       Objective:   Physical Exam BP 120/62  Pulse 73  Temp(Src) 97.5 F (36.4 C) (Oral)  Ht 6\' 2"  (1.88 m)  Wt 209 lb 2 oz (94.858 kg)  BMI 26.85 kg/m2  SpO2 93% Physical Exam  VS noted, not ill appearing Constitutional: Pt appears well-developed and well-nourished.  HENT: Head: Normocephalic.  Right Ear: External ear normal.  Left Ear: External ear normal.  Eyes: Conjunctivae and EOM are normal. Pupils are equal, round, and reactive to light.  Neck: Normal range of motion. Neck supple.  Cardiovascular: Normal rate and regular rhythm.   Pulmonary/Chest: Effort normal and breath sounds normal.  Neurological: Pt is alert. Not confused, motor/gait intact Skin: Skin is warm. No erythema.  Psychiatric: Pt behavior is normal. Thought content normal. does not appear depressed affect or anxious    Assessment & Plan:

## 2012-04-14 ENCOUNTER — Telehealth: Payer: Self-pay | Admitting: Internal Medicine

## 2012-04-14 ENCOUNTER — Telehealth: Payer: Self-pay

## 2012-04-14 MED ORDER — TRAMADOL HCL 50 MG PO TABS: 50.0000 mg | ORAL_TABLET | Freq: Two times a day (BID) | ORAL | Status: DC | PRN

## 2012-04-14 MED ORDER — INSULIN GLARGINE 100 UNIT/ML ~~LOC~~ SOLN: 50.0000 [IU] | Freq: Every day | SUBCUTANEOUS | Status: DC

## 2012-04-14 NOTE — Telephone Encounter (Signed)
Needs rx of lantus sent to [facility]

## 2012-04-14 NOTE — Telephone Encounter (Signed)
Don erx 

## 2012-07-05 ENCOUNTER — Other Ambulatory Visit (INDEPENDENT_AMBULATORY_CARE_PROVIDER_SITE_OTHER): Payer: Medicare Other

## 2012-07-05 DIAGNOSIS — Z0000 Encounter for general adult medical examination without abnormal findings: Secondary | ICD-10-CM

## 2012-07-05 DIAGNOSIS — Z125 Encounter for screening for malignant neoplasm of prostate: Secondary | ICD-10-CM

## 2012-07-05 DIAGNOSIS — E119 Type 2 diabetes mellitus without complications: Secondary | ICD-10-CM

## 2012-07-05 LAB — URINALYSIS, ROUTINE W REFLEX MICROSCOPIC
Hgb urine dipstick: NEGATIVE
Nitrite: NEGATIVE
Total Protein, Urine: NEGATIVE
Urobilinogen, UA: 0.2 (ref 0.0–1.0)

## 2012-07-05 LAB — LIPID PANEL
Cholesterol: 138 mg/dL (ref 0–200)
Triglycerides: 93 mg/dL (ref 0.0–149.0)

## 2012-07-05 LAB — CBC WITH DIFFERENTIAL/PLATELET
Basophils Relative: 0.7 % (ref 0.0–3.0)
Eosinophils Absolute: 0.1 10*3/uL (ref 0.0–0.7)
Lymphocytes Relative: 33.3 % (ref 12.0–46.0)
MCHC: 33.2 g/dL (ref 30.0–36.0)
Neutrophils Relative %: 51.4 % (ref 43.0–77.0)
RBC: 5.39 Mil/uL (ref 4.22–5.81)
WBC: 5.9 10*3/uL (ref 4.5–10.5)

## 2012-07-05 LAB — BASIC METABOLIC PANEL
BUN: 32 mg/dL — ABNORMAL HIGH (ref 6–23)
CO2: 26 mEq/L (ref 19–32)
Chloride: 100 mEq/L (ref 96–112)
Creatinine, Ser: 2.3 mg/dL — ABNORMAL HIGH (ref 0.4–1.5)
Glucose, Bld: 195 mg/dL — ABNORMAL HIGH (ref 70–99)

## 2012-07-05 LAB — HEPATIC FUNCTION PANEL
ALT: 23 U/L (ref 0–53)
Albumin: 3.9 g/dL (ref 3.5–5.2)
Bilirubin, Direct: 0.1 mg/dL (ref 0.0–0.3)
Total Protein: 7.2 g/dL (ref 6.0–8.3)

## 2012-07-05 LAB — HEMOGLOBIN A1C: Hgb A1c MFr Bld: 7.6 % — ABNORMAL HIGH (ref 4.6–6.5)

## 2012-07-05 LAB — MICROALBUMIN / CREATININE URINE RATIO
Creatinine,U: 167.9 mg/dL
Microalb Creat Ratio: 1 mg/g (ref 0.0–30.0)

## 2012-07-08 ENCOUNTER — Ambulatory Visit (INDEPENDENT_AMBULATORY_CARE_PROVIDER_SITE_OTHER): Payer: Medicare Other | Admitting: Internal Medicine

## 2012-07-08 ENCOUNTER — Encounter: Payer: Self-pay | Admitting: Internal Medicine

## 2012-07-08 VITALS — BP 132/60 | HR 73 | Temp 98.0°F | Ht 74.0 in | Wt 210.0 lb

## 2012-07-08 DIAGNOSIS — E785 Hyperlipidemia, unspecified: Secondary | ICD-10-CM

## 2012-07-08 DIAGNOSIS — E119 Type 2 diabetes mellitus without complications: Secondary | ICD-10-CM

## 2012-07-08 DIAGNOSIS — R972 Elevated prostate specific antigen [PSA]: Secondary | ICD-10-CM

## 2012-07-08 DIAGNOSIS — I10 Essential (primary) hypertension: Secondary | ICD-10-CM

## 2012-07-08 DIAGNOSIS — Z23 Encounter for immunization: Secondary | ICD-10-CM

## 2012-07-08 NOTE — Patient Instructions (Addendum)
You had the flu shot today You will be contacted regarding the referral for: urology for late January per your request today for the elevated PSA, to look in to the possibility of prostate cancer Continue all other medications as before Please have the pharmacy call with any refills you may need. Please continue your efforts at being more active, low cholesterol diet, and weight control. Please return in 6 mo with Lab testing done 3-5 days before

## 2012-07-09 ENCOUNTER — Encounter: Payer: Self-pay | Admitting: Internal Medicine

## 2012-07-09 NOTE — Progress Notes (Signed)
Subjective:    Patient ID: Eric Wade, male    DOB: [birth year], 71 y.o.   MRN: [id number]  HPI  Here to f/u; overall doing ok,  Pt denies chest pain, increased sob or doe, wheezing, orthopnea, PND, increased LE swelling, palpitations, dizziness or syncope.  Pt denies new neurological symptoms such as new headache, or facial or extremity weakness or numbness   Pt denies polydipsia, polyuria, or low sugar symptoms such as weakness or confusion improved with po intake.  Pt states overall good compliance with meds, trying to follow lower cholesterol, diabetic diet, wt overall stable but little exercise however. Not checking cbg's lately.  For flu shot today.  Has increased PSA  -  Denies urinary symptoms such as dysuria, frequency, urgency,or hematuria. Past Medical History  Diagnosis Date  . DIABETES MELLITUS, TYPE II 05/08/2007  . HYPERLIPIDEMIA 05/08/2007  . GOUT 07/01/2007  . OBESITY 05/08/2007  . ERECTILE DYSFUNCTION 05/08/2007  . HYPERTENSION 05/08/2007  . RENAL INSUFFICIENCY 07/01/2007  . PSA, INCREASED 07/02/2008  . Preventative health care 12/28/2010   No past surgical history on file.  reports that he has quit smoking. He does not have any smokeless tobacco history on file. He reports that he does not drink alcohol or use illicit drugs. family history includes Hypertension in his other. Allergies  Allergen Reactions  . Nsaids     REACTION: renal insufficiency   Current Outpatient Prescriptions on File Prior to Visit  Medication Sig Dispense Refill  . aspirin 81 MG EC tablet Take 81 mg by mouth daily.        [name] diltiazem (DILT-XR) 240 MG 24 hr capsule Take 1 capsule (240 mg total) by mouth daily.  90 capsule  3  . insulin glargine (LANTUS) 100 UNIT/ML injection Inject 50 Units into the skin daily.  45 mL  3  . lisinopril-hydrochlorothiazide (PRINZIDE,ZESTORETIC) 20-12.5 MG per tablet Take 1 tablet by mouth 2 (two) times daily.  180 tablet  3  . lovastatin (MEVACOR) 40 MG tablet Take 1  tablet (40 mg total) by mouth daily.  90 tablet  3  . sitaGLIPtin (JANUVIA) 50 MG tablet Take 1 tablet (50 mg total) by mouth daily.  90 tablet  3  . traMADol (ULTRAM) 50 MG tablet Take 1 tablet (50 mg total) by mouth 2 (two) times daily as needed for pain.  60 tablet  1   Review of Systems  Constitutional: Negative for diaphoresis and unexpected weight change.  HENT: Negative for tinnitus.   Eyes: Negative for photophobia and visual disturbance.  Respiratory: Negative for choking and stridor.   Gastrointestinal: Negative for vomiting and blood in stool.  Genitourinary: Negative for hematuria and decreased urine volume.  Musculoskeletal: Negative for gait problem.  Skin: Negative for color change and wound.  Neurological: Negative for tremors and numbness.  Psychiatric/Behavioral: Negative for decreased concentration. The patient is not hyperactive.       Objective:   Physical Exam BP 132/60  Pulse 73  Temp 98 F (36.7 C) (Oral)  Ht 6\' 2"  (1.88 m)  Wt 210 lb (95.255 kg)  BMI 26.96 kg/m2  SpO2 94% Physical Exam  VS noted Constitutional: Pt appears well-developed and well-nourished.  HENT: Head: Normocephalic.  Right Ear: External ear normal.  Left Ear: External ear normal.  Eyes: Conjunctivae and EOM are normal. Pupils are equal, round, and reactive to light.  Neck: Normal range of motion. Neck supple.  Cardiovascular: Normal rate and regular rhythm.   Pulmonary/Chest: Effort normal  and breath sounds normal.  Neurological: Pt is alert. Not confused  Skin: Skin is warm. No erythema.  Psychiatric: Pt behavior is normal. Thought content normal.     Assessment & Plan:

## 2012-07-09 NOTE — Assessment & Plan Note (Signed)
stable overall by hx and exam, most recent data reviewed with pt, and pt to continue medical treatment as before Lab Results  Component Value Date   LDLCALC 84 07/05/2012

## 2012-07-09 NOTE — Assessment & Plan Note (Signed)
stable overall by hx and exam, most recent data reviewed with pt, and pt to continue medical treatment as before Lab Results  Component Value Date   HGBA1C 7.6* 07/05/2012

## 2012-07-09 NOTE — Assessment & Plan Note (Signed)
stable overall by hx and exam, most recent data reviewed with pt, and pt to continue medical treatment as before BP Readings from Last 3 Encounters:  07/08/12 132/60  01/05/12 120/62  07/07/11 130/60

## 2012-07-09 NOTE — Assessment & Plan Note (Signed)
I strongly recommended pt for urology referral now to r/o malignancy, but he declines except will accept in late Jan 2014, b/c he is "busy" until then with other concerns

## 2012-12-21 ENCOUNTER — Other Ambulatory Visit (HOSPITAL_COMMUNITY): Payer: Self-pay | Admitting: Urology

## 2012-12-21 DIAGNOSIS — C61 Malignant neoplasm of prostate: Secondary | ICD-10-CM

## 2012-12-26 ENCOUNTER — Other Ambulatory Visit: Payer: Self-pay

## 2012-12-26 DIAGNOSIS — E119 Type 2 diabetes mellitus without complications: Secondary | ICD-10-CM

## 2012-12-26 MED ORDER — SITAGLIPTIN PHOSPHATE 50 MG PO TABS: 50.0000 mg | ORAL_TABLET | Freq: Every day | ORAL | Status: DC

## 2012-12-30 ENCOUNTER — Encounter (HOSPITAL_COMMUNITY): Payer: Self-pay

## 2012-12-30 ENCOUNTER — Encounter (HOSPITAL_COMMUNITY)
Admission: RE | Admit: 2012-12-30 | Discharge: 2012-12-30 | Disposition: A | Discharge status: Home / Self Care | Payer: Medicare Other | Source: Ambulatory Visit | Attending: Urology | Admitting: Urology

## 2012-12-30 DIAGNOSIS — C61 Malignant neoplasm of prostate: Secondary | ICD-10-CM | POA: Insufficient documentation

## 2012-12-30 DIAGNOSIS — M899 Disorder of bone, unspecified: Secondary | ICD-10-CM | POA: Insufficient documentation

## 2012-12-30 DIAGNOSIS — R972 Elevated prostate specific antigen [PSA]: Secondary | ICD-10-CM | POA: Insufficient documentation

## 2012-12-30 MED ORDER — TECHNETIUM TC 99M MEDRONATE IV KIT
25.1000 | PACK | Freq: Once | INTRAVENOUS | Status: AC | PRN
  Administered 2012-12-30: 25.1 via INTRAVENOUS

## 2013-01-05 ENCOUNTER — Encounter: Payer: Self-pay | Admitting: Internal Medicine

## 2013-01-05 ENCOUNTER — Other Ambulatory Visit (INDEPENDENT_AMBULATORY_CARE_PROVIDER_SITE_OTHER): Payer: Medicare Other

## 2013-01-05 ENCOUNTER — Ambulatory Visit (INDEPENDENT_AMBULATORY_CARE_PROVIDER_SITE_OTHER): Payer: Medicare Other | Admitting: Internal Medicine

## 2013-01-05 VITALS — BP 124/68 | HR 66 | Temp 97.0°F | Ht 74.0 in | Wt 205.5 lb

## 2013-01-05 DIAGNOSIS — E785 Hyperlipidemia, unspecified: Secondary | ICD-10-CM

## 2013-01-05 DIAGNOSIS — C61 Malignant neoplasm of prostate: Secondary | ICD-10-CM

## 2013-01-05 DIAGNOSIS — I10 Essential (primary) hypertension: Secondary | ICD-10-CM

## 2013-01-05 DIAGNOSIS — E119 Type 2 diabetes mellitus without complications: Secondary | ICD-10-CM

## 2013-01-05 DIAGNOSIS — N259 Disorder resulting from impaired renal tubular function, unspecified: Secondary | ICD-10-CM

## 2013-01-05 LAB — LIPID PANEL
HDL: 41.7 mg/dL (ref >39.00)
LDL Cholesterol: 79 mg/dL (ref 0–99)
Total CHOL/HDL Ratio: 4
VLDL: 31 mg/dL (ref 0.0–40.0)

## 2013-01-05 LAB — HEPATIC FUNCTION PANEL
Albumin: 4.3 g/dL (ref 3.5–5.2)
Alkaline Phosphatase: 66 U/L (ref 39–117)
Bilirubin, Direct: 0.1 mg/dL (ref 0.0–0.3)

## 2013-01-05 LAB — BASIC METABOLIC PANEL
Calcium: 9.4 mg/dL (ref 8.4–10.5)
GFR: 34.34 mL/min — ABNORMAL LOW (ref >60.00)
Sodium: 135 mEq/L (ref 135–145)

## 2013-01-05 MED ORDER — INSULIN GLARGINE 100 UNIT/ML ~~LOC~~ SOLN: 30.0000 [IU] | Freq: Every day | SUBCUTANEOUS | Status: DC

## 2013-01-05 MED ORDER — LISINOPRIL 20 MG PO TABS: 20.0000 mg | ORAL_TABLET | Freq: Every day | ORAL | Status: DC

## 2013-01-05 NOTE — Assessment & Plan Note (Signed)
stable overall by history and exam, recent data reviewed with pt, and pt to continue medical treatment as before,  to f/u any worsening symptoms or concerns Lab Results  Component Value Date   LDLCALC 79 01/05/2013

## 2013-01-05 NOTE — Assessment & Plan Note (Signed)
stable overall by history and exam, recent data reviewed with pt, and pt to continue medical treatment as before,  to f/u any worsening symptoms or concerns BP Readings from Last 3 Encounters:  01/05/13 124/68  07/08/12 132/60  01/05/12 120/62    

## 2013-01-05 NOTE — Assessment & Plan Note (Signed)
stable overall by history and exam, recent data reviewed with pt, and pt to continue medical treatment as before,  to f/u any worsening symptoms or concerns BP Readings from Last 3 Encounters:  01/05/13 124/68  07/08/12 132/60  01/05/12 120/62

## 2013-01-05 NOTE — Assessment & Plan Note (Signed)
D/w pt abnormal bone scan, for f/u urology as planned for metastatic prostate ca

## 2013-01-05 NOTE — Patient Instructions (Addendum)
Ok to decrease the lantus to 30 units OK to stop the lisinopril HCT Please start the Lisinopril 20 mg per day Please continue all other medications as before, and refills have been done if requested. Please have the pharmacy call with any other refills you may need. Please keep your appointments with your specialists as you have planned  - for the prostate cancer, as well as Dr Allena Katz (medical kidney doctor) Please call with your blood sugars and Blood pressures at home in 1 week Please go to the LAB in the Basement (turn left off the elevator) for the tests to be done today You will be contacted by phone if any changes need to be made immediately.  Otherwise, you will receive a letter about your results with an explanation We will try to fax results to Dr Allena Katz later today Please remember to sign up for My Chart if you have not done so, as this will be important to you in the future with finding out test results, communicating by private email, and scheduling acute appointments online when needed. Please return in 6 months, or sooner if needed

## 2013-01-05 NOTE — Progress Notes (Signed)
  Subjective:    Patient ID: Eric Wade, male    DOB: 1940-11-09, 72 y.o.   MRN: [id number]  HPI    Here to f/u; overall doing ok,  Pt denies chest pain, increased sob or doe, wheezing, orthopnea, PND, increased LE swelling, palpitations, dizziness or syncope.  Pt denies polydipsia, polyuria, or low sugar symptoms such as weakness or confusion improved with po intake.  Pt denies new neurological symptoms such as new headache, or facial or extremity weakness or numbness.   Pt states overall good compliance with meds, has been trying to follow lower cholesterol diet, with wt overall stable,  but little exercise however.  Due f/u with urology soon per pt after recent prostate ca dx, with abnormal uptake c/w mets.  Lost 5 lbs since nov 2013.  Has had mutliple low sugars with less appetite.  .chx Current Outpatient Prescriptions on File Prior to Visit  Medication Sig Dispense Refill  . aspirin 81 MG EC tablet Take 81 mg by mouth daily.        [name] diltiazem (DILT-XR) 240 MG 24 hr capsule Take 1 capsule (240 mg total) by mouth daily.  90 capsule  3  . lovastatin (MEVACOR) 40 MG tablet Take 1 tablet (40 mg total) by mouth daily.  90 tablet  3  . sitaGLIPtin (JANUVIA) 50 MG tablet Take 1 tablet (50 mg total) by mouth daily.  90 tablet  3  . traMADol (ULTRAM) 50 MG tablet Take 1 tablet (50 mg total) by mouth 2 (two) times daily as needed for pain.  60 tablet  1   No current facility-administered medications on file prior to visit.   Review of Systems  Constitutional: Negative for unexpected weight change, or unusual diaphoresis  HENT: Negative for tinnitus.   Eyes: Negative for photophobia and visual disturbance.  Respiratory: Negative for choking and stridor.   Gastrointestinal: Negative for vomiting and blood in stool.  Genitourinary: Negative for hematuria and decreased urine volume.  Musculoskeletal: Negative for acute joint swelling Skin: Negative for color change and wound.  Neurological:  Negative for tremors and numbness other than noted  Psychiatric/Behavioral: Negative for decreased concentration or  hyperactivity.       Objective:   Physical Exam BP 124/68  Pulse 66  Temp(Src) 97 F (36.1 C) (Oral)  Ht 6\' 2"  (1.88 m)  Wt 205 lb 8 oz (93.214 kg)  BMI 26.37 kg/m2  SpO2 95% VS noted,  Constitutional: Pt appears well-developed and well-nourished.  HENT: Head: NCAT.  Right Ear: External ear normal.  Left Ear: External ear normal.  Eyes: Conjunctivae and EOM are normal. Pupils are equal, round, and reactive to light.  Neck: Normal range of motion. Neck supple.  Cardiovascular: Normal rate and regular rhythm.   Pulmonary/Chest: Effort normal and breath sounds normal.  Abd:  Soft, NT, non-distended, + BS Neurological: Pt is alert. Not confused  Skin: Skin is warm. No erythema. No LE edema Psychiatric: Pt behavior is normal. Thought content normal.     Assessment & Plan:

## 2013-01-05 NOTE — Assessment & Plan Note (Signed)
stable overall by history and exam, recent data reviewed with pt, and pt to continue medical treatment as before,  to f/u any worsening symptoms or concerns Lab Results  Component Value Date   HGBA1C 8.2* 01/05/2013

## 2013-01-11 ENCOUNTER — Ambulatory Visit: Payer: Medicare Other

## 2013-01-11 ENCOUNTER — Ambulatory Visit: Payer: Medicare Other | Admitting: Radiation Oncology

## 2013-01-17 ENCOUNTER — Encounter: Payer: Self-pay | Admitting: Internal Medicine

## 2013-03-13 ENCOUNTER — Encounter: Payer: Self-pay | Admitting: Nephrology

## 2013-03-20 ENCOUNTER — Other Ambulatory Visit: Payer: Self-pay

## 2013-03-20 MED ORDER — DILTIAZEM HCL ER 240 MG PO CP24: 240.0000 mg | ORAL_CAPSULE | Freq: Every day | ORAL | Status: DC

## 2013-03-20 MED ORDER — LOVASTATIN 40 MG PO TABS: 40.0000 mg | ORAL_TABLET | Freq: Every day | ORAL | Status: DC

## 2013-05-17 ENCOUNTER — Telehealth: Payer: Self-pay | Admitting: Internal Medicine

## 2013-05-17 MED ORDER — INSULIN GLARGINE 100 UNIT/ML ~~LOC~~ SOLN: 30.0000 [IU] | Freq: Every day | SUBCUTANEOUS | Status: DC

## 2013-05-17 NOTE — Telephone Encounter (Signed)
Patient informed. 

## 2013-05-17 NOTE — Telephone Encounter (Signed)
Request refill on insulin glargine (LANTUS) 100 UNIT/ML injection, please call into [facility] on [address] Please call patient once sent

## 2013-07-11 ENCOUNTER — Other Ambulatory Visit: Payer: Self-pay | Admitting: Internal Medicine

## 2013-07-11 ENCOUNTER — Ambulatory Visit (INDEPENDENT_AMBULATORY_CARE_PROVIDER_SITE_OTHER): Payer: Medicare Other | Admitting: Internal Medicine

## 2013-07-11 ENCOUNTER — Other Ambulatory Visit (INDEPENDENT_AMBULATORY_CARE_PROVIDER_SITE_OTHER): Payer: Medicare Other

## 2013-07-11 ENCOUNTER — Encounter: Payer: Self-pay | Admitting: Internal Medicine

## 2013-07-11 VITALS — BP 160/72 | HR 77 | Temp 97.1°F | Ht 74.0 in | Wt 209.0 lb

## 2013-07-11 DIAGNOSIS — E119 Type 2 diabetes mellitus without complications: Secondary | ICD-10-CM

## 2013-07-11 DIAGNOSIS — I10 Essential (primary) hypertension: Secondary | ICD-10-CM

## 2013-07-11 DIAGNOSIS — Z0000 Encounter for general adult medical examination without abnormal findings: Secondary | ICD-10-CM

## 2013-07-11 DIAGNOSIS — Z23 Encounter for immunization: Secondary | ICD-10-CM

## 2013-07-11 LAB — CBC WITH DIFFERENTIAL/PLATELET
Basophils Absolute: 0 10*3/uL (ref 0.0–0.1)
Basophils Relative: 0.5 % (ref 0.0–3.0)
Eosinophils Absolute: 0.1 10*3/uL (ref 0.0–0.7)
Lymphocytes Relative: 39.8 % (ref 12.0–46.0)
MCHC: 34.1 g/dL (ref 30.0–36.0)
Monocytes Absolute: 0.8 10*3/uL (ref 0.1–1.0)
Neutrophils Relative %: 46.5 % (ref 43.0–77.0)
Platelets: 195 10*3/uL (ref 150.0–400.0)
RBC: 4.49 Mil/uL (ref 4.22–5.81)
RDW: 13.3 % (ref 11.5–14.6)

## 2013-07-11 LAB — HEPATIC FUNCTION PANEL
ALT: 24 U/L (ref 0–53)
AST: 37 U/L (ref 0–37)
Bilirubin, Direct: 0.1 mg/dL (ref 0.0–0.3)
Total Protein: 7.5 g/dL (ref 6.0–8.3)

## 2013-07-11 LAB — URINALYSIS, ROUTINE W REFLEX MICROSCOPIC
Bilirubin Urine: NEGATIVE
Hgb urine dipstick: NEGATIVE
Leukocytes, UA: NEGATIVE
Nitrite: NEGATIVE
Urobilinogen, UA: 0.2 (ref 0.0–1.0)

## 2013-07-11 LAB — BASIC METABOLIC PANEL
CO2: 29 mEq/L (ref 19–32)
Chloride: 100 mEq/L (ref 96–112)
Creatinine, Ser: 1.7 mg/dL — ABNORMAL HIGH (ref 0.4–1.5)
Potassium: 3.2 mEq/L — ABNORMAL LOW (ref 3.5–5.1)

## 2013-07-11 LAB — LIPID PANEL
Cholesterol: 172 mg/dL (ref 0–200)
Total CHOL/HDL Ratio: 4
Triglycerides: 130 mg/dL (ref 0.0–149.0)

## 2013-07-11 LAB — MICROALBUMIN / CREATININE URINE RATIO
Creatinine,U: 108.6 mg/dL
Microalb Creat Ratio: 4.1 mg/g (ref 0.0–30.0)

## 2013-07-11 LAB — TSH: TSH: 1.26 u[IU]/mL (ref 0.35–5.50)

## 2013-07-11 MED ORDER — SITAGLIPTIN PHOSPHATE 100 MG PO TABS: 100.0000 mg | ORAL_TABLET | Freq: Every day | ORAL | Status: DC

## 2013-07-11 NOTE — Progress Notes (Signed)
Pre-visit discussion using our clinic review tool. No additional management support is needed unless otherwise documented below in the visit note.  

## 2013-07-11 NOTE — Assessment & Plan Note (Signed)

## 2013-07-11 NOTE — Progress Notes (Signed)
Subjective:    Patient ID: Eric Wade, male    DOB: 1941/01/09, 72 y.o.   MRN: [id number]  HPI  Here for wellness and f/u;  Overall doing ok;  Pt denies CP, worsening SOB, DOE, wheezing, orthopnea, PND, worsening LE edema, palpitations, dizziness or syncope.  Pt denies neurological change such as new headache, facial or extremity weakness.  Pt denies polydipsia, polyuria, or low sugar symptoms. Pt states overall good compliance with treatment and medications, good tolerability, and has been trying to follow lower cholesterol diet.  Pt denies worsening depressive symptoms, suicidal ideation or panic. No fever, night sweats, wt loss, loss of appetite, or other constitutional symptoms.  Pt states good ability with ADL's, has low fall risk, home safety reviewed and adequate, no other significant changes in hearing or vision, and only occasionally active with exercise.  Had run out of insulin for 3 days a month ago, overall good complaince o/w, had some trouble with pharmacy.  BP at home usually < 140/90 BP Readings from Last 3 Encounters:  07/11/13 160/72  01/05/13 124/68  07/08/12 132/60    Past Medical History  Diagnosis Date  . DIABETES MELLITUS, TYPE II 05/08/2007  . HYPERLIPIDEMIA 05/08/2007  . GOUT 07/01/2007  . OBESITY 05/08/2007  . ERECTILE DYSFUNCTION 05/08/2007  . HYPERTENSION 05/08/2007  . RENAL INSUFFICIENCY 07/01/2007  . PSA, INCREASED 07/02/2008  . Preventative health care 12/28/2010  . Prostate cancer 07/02/2008    Qualifier: Diagnosis of  By: Jonny Ruiz MD, Len Blalock    No past surgical history on file.  reports that he has quit smoking. He does not have any smokeless tobacco history on file. He reports that he does not drink alcohol or use illicit drugs. family history includes Hypertension in his other. Allergies  Allergen Reactions  . Nsaids     REACTION: renal insufficiency   Current Outpatient Prescriptions on File Prior to Visit  Medication Sig Dispense Refill  . aspirin 81 MG  EC tablet Take 81 mg by mouth daily.        [name] diltiazem (DILT-XR) 240 MG 24 hr capsule Take 1 capsule (240 mg total) by mouth daily.  90 capsule  3  . insulin glargine (LANTUS) 100 UNIT/ML injection Inject 0.3 mLs (30 Units total) into the skin daily.  45 mL  3  . lisinopril (PRINIVIL,ZESTRIL) 20 MG tablet Take 1 tablet (20 mg total) by mouth daily.  90 tablet  3  . lovastatin (MEVACOR) 40 MG tablet Take 1 tablet (40 mg total) by mouth daily.  90 tablet  3  . sitaGLIPtin (JANUVIA) 50 MG tablet Take 1 tablet (50 mg total) by mouth daily.  90 tablet  3  . traMADol (ULTRAM) 50 MG tablet Take 1 tablet (50 mg total) by mouth 2 (two) times daily as needed for pain.  60 tablet  1   No current facility-administered medications on file prior to visit.   Review of Systems .Constitutional: Negative for diaphoresis, activity change, appetite change or unexpected weight change.  HENT: Negative for hearing loss, ear pain, facial swelling, mouth sores and neck stiffness.   Eyes: Negative for pain, redness and visual disturbance.  Respiratory: Negative for shortness of breath and wheezing.   Cardiovascular: Negative for chest pain and palpitations.  Gastrointestinal: Negative for diarrhea, blood in stool, abdominal distention or other pain Genitourinary: Negative for hematuria, flank pain or change in urine volume.  Musculoskeletal: Negative for myalgias and joint swelling.  Skin: Negative for color change and wound.  Neurological: Negative for syncope and numbness. other than noted Hematological: Negative for adenopathy.  Psychiatric/Behavioral: Negative for hallucinations, self-injury, decreased concentration and agitation.  \    Objective:   Physical Exam BP 160/72  Pulse 77  Temp(Src) 97.1 F (36.2 C) (Oral)  Ht 6\' 2"  (1.88 m)  Wt 209 lb (94.802 kg)  BMI 26.82 kg/m2  SpO2 94% VS noted,  Constitutional: Pt is oriented to person, place, and time. Appears well-developed and well-nourished.   Head: Normocephalic and atraumatic.  Right Ear: External ear normal.  Left Ear: External ear normal.  Nose: Nose normal.  Mouth/Throat: Oropharynx is clear and moist.  Eyes: Conjunctivae and EOM are normal. Pupils are equal, round, and reactive to light.  Neck: Normal range of motion. Neck supple. No JVD present. No tracheal deviation present.  Cardiovascular: Normal rate, regular rhythm, normal heart sounds and intact distal pulses.   Pulmonary/Chest: Effort normal and breath sounds normal.  Abdominal: Soft. Bowel sounds are normal. There is no tenderness. No HSM  Musculoskeletal: Normal range of motion. Exhibits no edema.  Lymphadenopathy:  Has no cervical adenopathy.  Neurological: Pt is alert and oriented to person, place, and time. Pt has normal reflexes. No cranial nerve deficit.  Skin: Skin is warm and dry. No rash noted.  Psychiatric:  Has  normal mood and affect. Behavior is normal.     Assessment & Plan:

## 2013-07-11 NOTE — Assessment & Plan Note (Signed)
Mild elev today, f/u BP at home and next visit, cont meds as is

## 2013-07-11 NOTE — Assessment & Plan Note (Signed)
stable overall by history and exam, recent data reviewed with pt, and pt to continue medical treatment as before,  to f/u any worsening symptoms or concerns. For f/u lab today 

## 2013-07-11 NOTE — Patient Instructions (Addendum)
You had the flu shot today Please return in 2 wks for a Nurse Visit for the new Prevnar pneumonia shot Please continue all other medications as before, and refills have been done if requested. Please have the pharmacy call with any other refills you may need. Please keep your appointments with your specialists as you may have planned - such as for the prostate cancer Please continue your efforts at being more active, low cholesterol diet, and weight control. You are otherwise up to date with prevention measures today.  Please go to the LAB in the Basement (turn left off the elevator) for the tests to be done today You will be contacted by phone if any changes need to be made immediately.  Otherwise, you will receive a letter about your results with an explanation, but please check with MyChart first.  Please remember to sign up for My Chart if you have not done so, as this will be important to you in the future with finding out test results, communicating by private email, and scheduling acute appointments online when needed.  Please return in 6 months, or sooner if needed

## 2013-08-01 ENCOUNTER — Ambulatory Visit (INDEPENDENT_AMBULATORY_CARE_PROVIDER_SITE_OTHER): Payer: Medicare Other | Admitting: *Deleted

## 2013-08-01 DIAGNOSIS — Z23 Encounter for immunization: Secondary | ICD-10-CM

## 2013-09-22 ENCOUNTER — Other Ambulatory Visit: Payer: Self-pay | Admitting: *Deleted

## 2013-09-22 MED ORDER — LOVASTATIN 40 MG PO TABS
40.0000 mg | ORAL_TABLET | Freq: Every day | ORAL | Status: DC
Start: 2013-09-22 — End: 2014-01-09

## 2013-09-22 MED ORDER — DILTIAZEM HCL ER 240 MG PO CP24: 240.0000 mg | ORAL_CAPSULE | Freq: Every day | ORAL | Status: DC

## 2013-09-22 MED ORDER — LISINOPRIL 20 MG PO TABS: 20.0000 mg | ORAL_TABLET | Freq: Every day | ORAL | Status: DC

## 2013-09-27 ENCOUNTER — Telehealth: Payer: Self-pay

## 2013-09-27 DIAGNOSIS — C61 Malignant neoplasm of prostate: Secondary | ICD-10-CM

## 2013-09-27 NOTE — Telephone Encounter (Signed)
Done per emr 

## 2013-09-27 NOTE — Telephone Encounter (Signed)
[facility] needs referral asap for Dr. Alinda Money as patient is there now unaware due to new insurance requires a referral.

## 2014-01-09 ENCOUNTER — Other Ambulatory Visit (INDEPENDENT_AMBULATORY_CARE_PROVIDER_SITE_OTHER): Payer: Commercial Managed Care - HMO

## 2014-01-09 ENCOUNTER — Ambulatory Visit (INDEPENDENT_AMBULATORY_CARE_PROVIDER_SITE_OTHER): Payer: Commercial Managed Care - HMO | Admitting: Internal Medicine

## 2014-01-09 ENCOUNTER — Encounter: Payer: Self-pay | Admitting: Internal Medicine

## 2014-01-09 VITALS — BP 152/70 | HR 58 | Temp 97.6°F | Ht 74.0 in | Wt 211.4 lb

## 2014-01-09 DIAGNOSIS — E119 Type 2 diabetes mellitus without complications: Secondary | ICD-10-CM

## 2014-01-09 DIAGNOSIS — E785 Hyperlipidemia, unspecified: Secondary | ICD-10-CM

## 2014-01-09 DIAGNOSIS — Z0000 Encounter for general adult medical examination without abnormal findings: Secondary | ICD-10-CM

## 2014-01-09 DIAGNOSIS — I10 Essential (primary) hypertension: Secondary | ICD-10-CM

## 2014-01-09 LAB — HEPATIC FUNCTION PANEL
ALT: 22 U/L (ref 0–53)
AST: 33 U/L (ref 0–37)
Albumin: 4.2 g/dL (ref 3.5–5.2)
Alkaline Phosphatase: 75 U/L (ref 39–117)
BILIRUBIN TOTAL: 0.5 mg/dL (ref 0.2–1.2)
Bilirubin, Direct: 0 mg/dL (ref 0.0–0.3)
Total Protein: 7.9 g/dL (ref 6.0–8.3)

## 2014-01-09 LAB — HEMOGLOBIN A1C: HEMOGLOBIN A1C: 8 % — AB (ref 4.6–6.5)

## 2014-01-09 LAB — BASIC METABOLIC PANEL
BUN: 20 mg/dL (ref 6–23)
CHLORIDE: 104 meq/L (ref 96–112)
CO2: 27 mEq/L (ref 19–32)
CREATININE: 1.9 mg/dL — AB (ref 0.4–1.5)
Calcium: 9.7 mg/dL (ref 8.4–10.5)
GFR: 43.83 mL/min — ABNORMAL LOW (ref >60.00)
GLUCOSE: 173 mg/dL — AB (ref 70–99)
Potassium: 3.8 mEq/L (ref 3.5–5.1)
Sodium: 139 mEq/L (ref 135–145)

## 2014-01-09 LAB — LIPID PANEL
CHOLESTEROL: 158 mg/dL (ref 0–200)
HDL: 42.5 mg/dL (ref >39.00)
LDL Cholesterol: 62 mg/dL (ref 0–99)
TRIGLYCERIDES: 268 mg/dL — AB (ref 0.0–149.0)
Total CHOL/HDL Ratio: 4
VLDL: 53.6 mg/dL — AB (ref 0.0–40.0)

## 2014-01-09 MED ORDER — LOVASTATIN 40 MG PO TABS: 40.0000 mg | ORAL_TABLET | Freq: Every day | ORAL | Status: DC

## 2014-01-09 MED ORDER — LISINOPRIL 20 MG PO TABS: 20.0000 mg | ORAL_TABLET | Freq: Every day | ORAL | Status: DC

## 2014-01-09 MED ORDER — DILTIAZEM HCL ER 240 MG PO CP24: 240.0000 mg | ORAL_CAPSULE | Freq: Every day | ORAL | Status: DC

## 2014-01-09 NOTE — Progress Notes (Signed)
Pre visit review using our clinic review tool, if applicable. No additional management support is needed unless otherwise documented below in the visit note. 

## 2014-01-09 NOTE — Progress Notes (Signed)
Subjective:    Patient ID: Eric Wade, male    DOB: [birth year], 73 y.o.   MRN: [id number]  HPI Here to f/u; overall doing ok,  Pt denies chest pain, increased sob or doe, wheezing, orthopnea, PND, increased LE swelling, palpitations, dizziness or syncope.  Pt denies polydipsia, polyuria, or low sugar symptoms such as weakness or confusion improved with po intake.  Pt denies new neurological symptoms such as new headache, or facial or extremity weakness or numbness.   Pt states overall good compliance with meds, has been trying to follow lower cholesterol, diabetic diet, with wt overall stable,  but little exercise however.  Sees Renal and Urology regularly.  Past Medical History  Diagnosis Date  . DIABETES MELLITUS, TYPE II 05/08/2007  . HYPERLIPIDEMIA 05/08/2007  . GOUT 07/01/2007  . OBESITY 05/08/2007  . ERECTILE DYSFUNCTION 05/08/2007  . HYPERTENSION 05/08/2007  . RENAL INSUFFICIENCY 07/01/2007  . PSA, INCREASED 07/02/2008  . Preventative health care 12/28/2010  . Prostate cancer 07/02/2008    Qualifier: Diagnosis of  By: Jenny Reichmann MD, Hunt Oris    No past surgical history on file.  reports that he has quit smoking. He does not have any smokeless tobacco history on file. He reports that he does not drink alcohol or use illicit drugs. family history includes Hypertension in his other. Allergies  Allergen Reactions  . Nsaids     REACTION: renal insufficiency   Current Outpatient Prescriptions on File Prior to Visit  Medication Sig Dispense Refill  . aspirin 81 MG EC tablet Take 81 mg by mouth daily.        . insulin glargine (LANTUS) 100 UNIT/ML injection Inject 0.3 mLs (30 Units total) into the skin daily.  45 mL  3  . sitaGLIPtin (JANUVIA) 100 MG tablet Take 1 tablet (100 mg total) by mouth daily.  90 tablet  3  . traMADol (ULTRAM) 50 MG tablet Take 1 tablet (50 mg total) by mouth 2 (two) times daily as needed for pain.  60 tablet  1   No current facility-administered medications on file  prior to visit.    Review of Systems  Constitutional: Negative for unusual diaphoresis or other sweats  HENT: Negative for ringing in ear Eyes: Negative for double vision or worsening visual disturbance.  Respiratory: Negative for choking and stridor.   Gastrointestinal: Negative for vomiting or other signifcant bowel change Genitourinary: Negative for hematuria or decreased urine volume.  Musculoskeletal: Negative for other MSK pain or swelling Skin: Negative for color change and worsening wound.  Neurological: Negative for tremors and numbness other than noted  Psychiatric/Behavioral: Negative for decreased concentration or agitation other than above       Objective:   Physical Exam BP 152/70  Pulse 58  Temp(Src) 97.6 F (36.4 C) (Oral)  Ht 6\' 2"  (1.88 m)  Wt 211 lb 6 oz (95.879 kg)  BMI 27.13 kg/m2  SpO2 93% VS noted,  Constitutional: Pt appears well-developed, well-nourished.  HENT: Head: NCAT.  Right Ear: External ear normal.  Left Ear: External ear normal.  Eyes: . Pupils are equal, round, and reactive to light. Conjunctivae and EOM are normal Neck: Normal range of motion. Neck supple.  Cardiovascular: Normal rate and regular rhythm.   Pulmonary/Chest: Effort normal and breath sounds normal.  Abd:  Soft, NT, ND, + BS Neurological: Pt is alert. Not confused , motor grossly intact Skin: Skin is warm. No rash Psychiatric: Pt behavior is normal. No agitation.  Trace RLE edema to  mid leg only    Assessment & Plan:

## 2014-01-09 NOTE — Assessment & Plan Note (Signed)
Still mild elevated, follow closely per renal, working on excess volume according to recent notes, cont tx as is, f/u renal as planned BP Readings from Last 3 Encounters:  01/09/14 152/70  07/11/13 160/72  01/05/13 124/68  ] \

## 2014-01-09 NOTE — Patient Instructions (Signed)
Please continue all other medications as before, and refills have been done if requested. Please have the pharmacy call with any other refills you may need.  Please continue your efforts at being more active, low cholesterol diet, and weight control.  Please keep your appointments with your specialists as you have planned  Please go to the LAB in the Basement (turn left off the elevator) for the tests to be done today  You will be contacted by phone if any changes need to be made immediately.  Otherwise, you will receive a letter about your results with an explanation, but please check with MyChart first.  Please return in 6 months, or sooner if needed, with Lab testing done 3-5 days before

## 2014-01-09 NOTE — Assessment & Plan Note (Signed)
stable overall by history and exam, recent data reviewed with pt, and pt to continue medical treatment as before,  to f/u any worsening symptoms or concerns Lab Results  Component Value Date   HGBA1C 7.9* 07/11/2013

## 2014-01-09 NOTE — Assessment & Plan Note (Signed)
stable overall by history and exam, recent data reviewed with pt, and pt to continue medical treatment as before,  to f/u any worsening symptoms or concerns Lab Results  Component Value Date   LDLCALC 98 07/11/2013

## 2014-01-10 ENCOUNTER — Encounter: Payer: Self-pay | Admitting: Internal Medicine

## 2014-01-10 ENCOUNTER — Other Ambulatory Visit: Payer: Self-pay | Admitting: Internal Medicine

## 2014-01-10 MED ORDER — INSULIN GLARGINE 100 UNIT/ML ~~LOC~~ SOLN: 35.0000 [IU] | Freq: Every day | SUBCUTANEOUS | Status: DC

## 2014-02-22 ENCOUNTER — Telehealth: Payer: Self-pay | Admitting: Internal Medicine

## 2014-02-22 NOTE — Telephone Encounter (Signed)
error 

## 2014-05-23 ENCOUNTER — Encounter (HOSPITAL_COMMUNITY): Payer: Self-pay | Admitting: Emergency Medicine

## 2014-05-23 ENCOUNTER — Emergency Department (HOSPITAL_COMMUNITY)
Admission: EM | Admit: 2014-05-23 | Discharge: 2014-05-23 | Disposition: A | Discharge status: Home / Self Care | Payer: Medicare HMO | Attending: Emergency Medicine | Admitting: Emergency Medicine

## 2014-05-23 DIAGNOSIS — Z794 Long term (current) use of insulin: Secondary | ICD-10-CM | POA: Insufficient documentation

## 2014-05-23 DIAGNOSIS — E785 Hyperlipidemia, unspecified: Secondary | ICD-10-CM | POA: Diagnosis not present

## 2014-05-23 DIAGNOSIS — E119 Type 2 diabetes mellitus without complications: Secondary | ICD-10-CM | POA: Diagnosis not present

## 2014-05-23 DIAGNOSIS — I10 Essential (primary) hypertension: Secondary | ICD-10-CM | POA: Insufficient documentation

## 2014-05-23 DIAGNOSIS — Z043 Encounter for examination and observation following other accident: Secondary | ICD-10-CM | POA: Diagnosis present

## 2014-05-23 DIAGNOSIS — Z87448 Personal history of other diseases of urinary system: Secondary | ICD-10-CM | POA: Insufficient documentation

## 2014-05-23 DIAGNOSIS — E669 Obesity, unspecified: Secondary | ICD-10-CM | POA: Diagnosis not present

## 2014-05-23 DIAGNOSIS — Z79899 Other long term (current) drug therapy: Secondary | ICD-10-CM | POA: Diagnosis not present

## 2014-05-23 DIAGNOSIS — Z7982 Long term (current) use of aspirin: Secondary | ICD-10-CM | POA: Diagnosis not present

## 2014-05-23 DIAGNOSIS — Z87891 Personal history of nicotine dependence: Secondary | ICD-10-CM | POA: Diagnosis not present

## 2014-05-23 DIAGNOSIS — Z8546 Personal history of malignant neoplasm of prostate: Secondary | ICD-10-CM | POA: Insufficient documentation
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
## 2014-05-23 NOTE — ED Notes (Signed)
Bed: [facility] Expected date:  Expected time:  Means of arrival:  Comments: 
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
## 2014-07-12 ENCOUNTER — Encounter: Payer: Self-pay | Admitting: Internal Medicine

## 2014-07-12 ENCOUNTER — Ambulatory Visit (INDEPENDENT_AMBULATORY_CARE_PROVIDER_SITE_OTHER): Payer: Commercial Managed Care - HMO | Admitting: Internal Medicine

## 2014-07-12 ENCOUNTER — Other Ambulatory Visit (INDEPENDENT_AMBULATORY_CARE_PROVIDER_SITE_OTHER): Payer: Commercial Managed Care - HMO

## 2014-07-12 VITALS — BP 180/70 | HR 80 | Temp 98.6°F | Ht 74.0 in | Wt 211.5 lb

## 2014-07-12 DIAGNOSIS — C61 Malignant neoplasm of prostate: Secondary | ICD-10-CM

## 2014-07-12 DIAGNOSIS — E119 Type 2 diabetes mellitus without complications: Secondary | ICD-10-CM

## 2014-07-12 DIAGNOSIS — Z0000 Encounter for general adult medical examination without abnormal findings: Secondary | ICD-10-CM

## 2014-07-12 DIAGNOSIS — Z23 Encounter for immunization: Secondary | ICD-10-CM

## 2014-07-12 LAB — BASIC METABOLIC PANEL
BUN: 16 mg/dL (ref 6–23)
CO2: 26 meq/L (ref 19–32)
Calcium: 10 mg/dL (ref 8.4–10.5)
Chloride: 104 mEq/L (ref 96–112)
Creatinine, Ser: 1.8 mg/dL — ABNORMAL HIGH (ref 0.4–1.5)
GFR: 47.44 mL/min — AB (ref >60.00)
GLUCOSE: 166 mg/dL — AB (ref 70–99)
Potassium: 3.5 mEq/L (ref 3.5–5.1)
SODIUM: 139 meq/L (ref 135–145)

## 2014-07-12 LAB — CBC WITH DIFFERENTIAL/PLATELET
BASOS ABS: 0.1 10*3/uL (ref 0.0–0.1)
Basophils Relative: 0.6 % (ref 0.0–3.0)
EOS ABS: 0.2 10*3/uL (ref 0.0–0.7)
Eosinophils Relative: 2.1 % (ref 0.0–5.0)
HEMATOCRIT: 42.5 % (ref 39.0–52.0)
Hemoglobin: 13.9 g/dL (ref 13.0–17.0)
LYMPHS ABS: 3.2 10*3/uL (ref 0.7–4.0)
Lymphocytes Relative: 38.4 % (ref 12.0–46.0)
MCHC: 32.8 g/dL (ref 30.0–36.0)
MCV: 90.7 fl (ref 78.0–100.0)
MONO ABS: 0.9 10*3/uL (ref 0.1–1.0)
MONOS PCT: 10.4 % (ref 3.0–12.0)
Neutro Abs: 4.1 10*3/uL (ref 1.4–7.7)
Neutrophils Relative %: 48.5 % (ref 43.0–77.0)
PLATELETS: 202 10*3/uL (ref 150.0–400.0)
RBC: 4.69 Mil/uL (ref 4.22–5.81)
RDW: 13.8 % (ref 11.5–15.5)
WBC: 8.4 10*3/uL (ref 4.0–10.5)

## 2014-07-12 LAB — HEPATIC FUNCTION PANEL
ALBUMIN: 3.6 g/dL (ref 3.5–5.2)
ALK PHOS: 92 U/L (ref 39–117)
ALT: 25 U/L (ref 0–53)
AST: 40 U/L — AB (ref 0–37)
Bilirubin, Direct: 0.1 mg/dL (ref 0.0–0.3)
Total Bilirubin: 0.4 mg/dL (ref 0.2–1.2)
Total Protein: 7.5 g/dL (ref 6.0–8.3)

## 2014-07-12 LAB — LIPID PANEL
CHOL/HDL RATIO: 5
CHOLESTEROL: 174 mg/dL (ref 0–200)
HDL: 38.6 mg/dL — ABNORMAL LOW (ref >39.00)
LDL CALC: 96 mg/dL (ref 0–99)
NonHDL: 135.4
Triglycerides: 197 mg/dL — ABNORMAL HIGH (ref 0.0–149.0)
VLDL: 39.4 mg/dL (ref 0.0–40.0)

## 2014-07-12 LAB — PSA: PSA: 0.11 ng/mL (ref 0.10–4.00)

## 2014-07-12 LAB — HEMOGLOBIN A1C: Hgb A1c MFr Bld: 8.1 % — ABNORMAL HIGH (ref 4.6–6.5)

## 2014-07-12 LAB — TSH: TSH: 1.43 u[IU]/mL (ref 0.35–4.50)

## 2014-07-12 MED ORDER — GLUCOSE BLOOD VI STRP: ORAL_STRIP | Status: DC

## 2014-07-12 MED ORDER — PIOGLITAZONE HCL 30 MG PO TABS: 30.0000 mg | ORAL_TABLET | Freq: Every day | ORAL | Status: DC

## 2014-07-12 NOTE — Patient Instructions (Addendum)
You had the flu shot today  Your glucometer and strips were prescribed today to [place]  OK to stop the Januvia  Please take all new medication as prescribed - the generic actos 30 mg per day, and the amlodipine 5 mg per day  Please continue all other medications as before, and refills have been done if requested.  Please have the pharmacy call with any other refills you may need.  Please continue your efforts at being more active, low cholesterol diet, and weight control.  You are otherwise up to date with prevention measures today.  Please keep your appointments with your specialists as you may have planned - urology  You will be contacted regarding the referral for: podiatry  Please go to the LAB in the Basement (turn left off the elevator) for the tests to be done today  You will be contacted by phone if any changes need to be made immediately.  Otherwise, you will receive a letter about your results with an explanation, but please check with MyChart first.  Please remember to sign up for MyChart if you have not done so, as this will be important to you in the future with finding out test results, communicating by private email, and scheduling acute appointments online when needed.  Please return in 6 months, or sooner if needed, with Lab testing done 3-5 days before

## 2014-07-12 NOTE — Assessment & Plan Note (Signed)
No longer can afford januvia, to change to actos 30 qd

## 2014-07-12 NOTE — Assessment & Plan Note (Signed)

## 2014-07-12 NOTE — Progress Notes (Signed)
Subjective:    Patient ID: Eric Wade, male    DOB: [birth year], 73 y.o.   MRN: [id number]  HPI  /Here for wellness and f/u;  Overall doing ok;  Pt denies CP, worsening SOB, DOE, wheezing, orthopnea, PND, worsening LE edema, palpitations, dizziness or syncope.  Pt denies neurological change such as new headache, facial or extremity weakness.  Pt denies polydipsia, polyuria, or low sugar symptoms. Pt states overall good compliance with treatment and medications, good tolerability, and has been trying to follow lower cholesterol diet.  Pt denies worsening depressive symptoms, suicidal ideation or panic. No fever, night sweats, wt loss, loss of appetite, or other constitutional symptoms.  Pt states good ability with ADL's, has low fall risk, home safety reviewed and adequate, no other significant changes in hearing or vision, and only occasionally active with exercise.  Wife on dialysis, causes stress for him.  Januvia too expensive. Sees urology for psa every 4 mo.  BP at home < 140 - 150. Has a fluid pill per nephrology he takes but not sure of name. Past Medical History  Diagnosis Date  . DIABETES MELLITUS, TYPE II 05/08/2007  . HYPERLIPIDEMIA 05/08/2007  . GOUT 07/01/2007  . OBESITY 05/08/2007  . ERECTILE DYSFUNCTION 05/08/2007  . HYPERTENSION 05/08/2007  . RENAL INSUFFICIENCY 07/01/2007  . PSA, INCREASED 07/02/2008  . Preventative health care 12/28/2010  . Prostate cancer 07/02/2008    Qualifier: Diagnosis of  By: Jenny Reichmann MD, Hunt Oris    No past surgical history on file.  reports that he has quit smoking. He does not have any smokeless tobacco history on file. He reports that he does not drink alcohol or use illicit drugs. family history includes Hypertension in his other. Allergies  Allergen Reactions  . Nsaids     REACTION: renal insufficiency   Current Outpatient Prescriptions on File Prior to Visit  Medication Sig Dispense Refill  . aspirin 81 MG EC tablet Take 81 mg by mouth daily.      [name]  diltiazem (DILT-XR) 240 MG 24 hr capsule Take 1 capsule (240 mg total) by mouth daily. 90 capsule 3  . insulin glargine (LANTUS) 100 UNIT/ML injection Inject 30-35 Units into the skin daily. Glucose level ~200= 30 units  Level 250=35    . lisinopril (PRINIVIL,ZESTRIL) 20 MG tablet Take 1 tablet (20 mg total) by mouth daily. 90 tablet 3  . lovastatin (MEVACOR) 40 MG tablet Take 1 tablet (40 mg total) by mouth daily. 90 tablet 3   No current facility-administered medications on file prior to visit.    Review of Systems Constitutional: Negative for increased diaphoresis, other activity, appetite or other siginficant weight change  HENT: Negative for worsening hearing loss, ear pain, facial swelling, mouth sores and neck stiffness.   Eyes: Negative for other worsening pain, redness or visual disturbance.  Respiratory: Negative for shortness of breath and wheezing.   Cardiovascular: Negative for chest pain and palpitations.  Gastrointestinal: Negative for diarrhea, blood in stool, abdominal distention or other pain Genitourinary: Negative for hematuria, flank pain or change in urine volume.  Musculoskeletal: Negative for myalgias or other joint complaints.  Skin: Negative for color change and wound.  Neurological: Negative for syncope and numbness. other than noted Hematological: Negative for adenopathy. or other swelling Psychiatric/Behavioral: Negative for hallucinations, self-injury, decreased concentration or other worsening agitation.      Objective:   Physical Exam BP 180/70 mmHg  Pulse 80  Temp(Src) 98.6 F (37 C) (Oral)  Ht 6'  2" (1.88 m)  Wt 211 lb 8 oz (95.936 kg)  BMI 27.14 kg/m2  SpO2 95% VS noted,  Constitutional: Pt is oriented to person, place, and time. Appears well-developed and well-nourished.  Head: Normocephalic and atraumatic.  Right Ear: External ear normal.  Left Ear: External ear normal.  Nose: Nose normal.  Mouth/Throat: Oropharynx is clear and moist.    Eyes: Conjunctivae and EOM are normal. Pupils are equal, round, and reactive to light.  Neck: Normal range of motion. Neck supple. No JVD present. No tracheal deviation present.  Cardiovascular: Normal rate, regular rhythm, normal heart sounds and intact distal pulses.   Pulmonary/Chest: Effort normal and breath sounds without rales or wheezing  Abdominal: Soft. Bowel sounds are normal. NT. No HSM  Musculoskeletal: Normal range of motion. Exhibits no edema.  Lymphadenopathy:  Has no cervical adenopathy.  Neurological: Pt is alert and oriented to person, place, and time. Pt has normal reflexes. No cranial nerve deficit. Motor grossly intact Skin: Skin is warm and dry. No rash noted.  Psychiatric:  Has normal mood and affect. Behavior is normal.     Assessment & Plan:

## 2014-07-12 NOTE — Assessment & Plan Note (Signed)
Pt needs updated referral

## 2014-07-12 NOTE — Progress Notes (Signed)
Pre visit review using our clinic review tool, if applicable. No additional management support is needed unless otherwise documented below in the visit note. 

## 2014-07-18 ENCOUNTER — Other Ambulatory Visit: Payer: Self-pay | Admitting: Internal Medicine

## 2014-07-18 ENCOUNTER — Encounter: Payer: Self-pay | Admitting: Internal Medicine

## 2014-08-01 ENCOUNTER — Ambulatory Visit: Payer: Self-pay | Admitting: Podiatry

## 2014-08-01 ENCOUNTER — Ambulatory Visit (INDEPENDENT_AMBULATORY_CARE_PROVIDER_SITE_OTHER): Payer: Commercial Managed Care - HMO | Admitting: Podiatry

## 2014-08-01 ENCOUNTER — Encounter: Payer: Self-pay | Admitting: Podiatry

## 2014-08-01 VITALS — BP 163/84 | HR 82 | Resp 14 | Ht 74.0 in | Wt 220.0 lb

## 2014-08-01 DIAGNOSIS — B351 Tinea unguium: Secondary | ICD-10-CM

## 2014-08-01 DIAGNOSIS — M79676 Pain in unspecified toe(s): Secondary | ICD-10-CM

## 2014-08-01 NOTE — Patient Instructions (Signed)
Diabetes and Foot Care Diabetes may cause you to have problems because of poor blood supply (circulation) to your feet and legs. This may cause the skin on your feet to become thinner, break easier, and heal more slowly. Your skin may become dry, and the skin may peel and crack. You may also have nerve damage in your legs and feet causing decreased feeling in them. You may not notice minor injuries to your feet that could lead to infections or more serious problems. Taking care of your feet is one of the most important things you can do for yourself.  HOME CARE INSTRUCTIONS  Wear shoes at all times, even in the house. Do not go barefoot. Bare feet are easily injured.  Check your feet daily for blisters, cuts, and redness. If you cannot see the bottom of your feet, use a mirror or ask someone for help.  Wash your feet with warm water (do not use hot water) and mild soap. Then pat your feet and the areas between your toes until they are completely dry. Do not soak your feet as this can dry your skin.  Apply a moisturizing lotion or petroleum jelly (that does not contain alcohol and is unscented) to the skin on your feet and to dry, brittle toenails. Do not apply lotion between your toes.  Trim your toenails straight across. Do not dig under them or around the cuticle. File the edges of your nails with an emery board or nail file.  Do not cut corns or calluses or try to remove them with medicine.  Wear clean socks or stockings every day. Make sure they are not too tight. Do not wear knee-high stockings since they may decrease blood flow to your legs.  Wear shoes that fit properly and have enough cushioning. To break in new shoes, wear them for just a few hours a day. This prevents you from injuring your feet. Always look in your shoes before you put them on to be sure there are no objects inside.  Do not cross your legs. This may decrease the blood flow to your feet.  If you find a minor scrape,  cut, or break in the skin on your feet, keep it and the skin around it clean and dry. These areas may be cleansed with mild soap and water. Do not cleanse the area with peroxide, alcohol, or iodine.  When you remove an adhesive bandage, be sure not to damage the skin around it.  If you have a wound, look at it several times a day to make sure it is healing.  Do not use heating pads or hot water bottles. They may burn your skin. If you have lost feeling in your feet or legs, you may not know it is happening until it is too late.  Make sure your health care provider performs a complete foot exam at least annually or more often if you have foot problems. Report any cuts, sores, or bruises to your health care provider immediately. SEEK MEDICAL CARE IF:   You have an injury that is not healing.  You have cuts or breaks in the skin.  You have an ingrown nail.  You notice redness on your legs or feet.  You feel burning or tingling in your legs or feet.  You have pain or cramps in your legs and feet.  Your legs or feet are numb.  Your feet always feel cold. SEEK IMMEDIATE MEDICAL CARE IF:   There is increasing redness,   swelling, or pain in or around a wound.  There is a red line that goes up your leg.  Pus is coming from a wound.  You develop a fever or as directed by your health care provider.  You notice a bad smell coming from an ulcer or wound. Document Released: 08/14/2000 Document Revised: 04/19/2013 Document Reviewed: 01/24/2013 [employer] Patient Information 2015 [employer]. This information is not intended to replace advice given to you by your health care provider. Make sure you discuss any questions you have with your health care provider.  

## 2014-08-01 NOTE — Progress Notes (Signed)
   Subjective:    Patient ID: Eric Wade, male    DOB: [birth year], 73 y.o.   MRN: [id number]  HPI  73 year old male persists the office they for diabetic risk assessment for painful elongated toenails. Patient states he is been diabetic for approximately 10-15 years. His last HbA1c on May 2 was 8.0. His last blood sugar checked this morning was 96. He denies any history of claudication symptoms or any tingling or numbness. States his nails are painful particularly with shoe gear.  No other complaints at this time.   Review of Systems  HENT: Positive for tinnitus.   Musculoskeletal: Positive for myalgias and back pain.  All other systems reviewed and are negative.      Objective:   Physical Exam AAO 3, NAD DP/PT pulses palpable bilaterally, CRT less than 3 seconds Protective sensation intact with Simms Weinstein monofilament, vibratory sensation intact, Achilles tendon reflex intact. Nails hypertrophic, dystrophic, elongated, brittle, discolored 10. The right hallux nail is loose and the underlying nail bed with some evidence of subungual hematoma. No surrounding erythema or drainage from around the nails. No open lesions. Cavus foot deformity with rigid hammertoe contractures. MMT 5/5, ROM WNL No pain with calf compression, swelling, warmth, erythema.      Assessment & Plan:  73 year old male with symptomatically onychomycosis -Treatment options discussed including all alternatives, risks, complications. -Nails sharply debrided 10 without complications. -Discussed importance daily foot inspection. Also discussed with him to keep monitoring the feet due to rigid hammertoe contractures that he is at high risk for calluses and pressure wounds to the end of the toes. She notices any changes to call the office. -Follow-up in 3 months or sooner if any palms are to arise. In the meantime, call the office with any questions, concerns, change in symptoms. Follow-up with PCP for other  issues mentioned in the review of systems.

## 2014-10-08 DIAGNOSIS — C61 Malignant neoplasm of prostate: Secondary | ICD-10-CM | POA: Diagnosis not present

## 2014-10-17 DIAGNOSIS — C61 Malignant neoplasm of prostate: Secondary | ICD-10-CM | POA: Diagnosis not present

## 2014-10-17 DIAGNOSIS — C7951 Secondary malignant neoplasm of bone: Secondary | ICD-10-CM | POA: Diagnosis not present

## 2014-10-31 ENCOUNTER — Encounter: Payer: Self-pay | Admitting: Podiatry

## 2014-10-31 ENCOUNTER — Ambulatory Visit (INDEPENDENT_AMBULATORY_CARE_PROVIDER_SITE_OTHER): Payer: Commercial Managed Care - HMO | Admitting: Podiatry

## 2014-10-31 VITALS — BP 171/83 | HR 74 | Resp 18

## 2014-10-31 DIAGNOSIS — M79676 Pain in unspecified toe(s): Secondary | ICD-10-CM

## 2014-10-31 DIAGNOSIS — L84 Corns and callosities: Secondary | ICD-10-CM

## 2014-10-31 DIAGNOSIS — B351 Tinea unguium: Secondary | ICD-10-CM

## 2014-10-31 DIAGNOSIS — E119 Type 2 diabetes mellitus without complications: Secondary | ICD-10-CM

## 2014-11-01 ENCOUNTER — Encounter: Payer: Self-pay | Admitting: Podiatry

## 2014-11-01 NOTE — Progress Notes (Signed)
Patient ID: Eric Wade, male   DOB: [birth year], 74 y.o.   MRN: [id number]  Subjective: 74 y.o.-year-old male returns the office today for painful, elongated, thickened toenails which he is unable to trim himself. Also states he has painful calluses to the bottom of his feet.  Denies any redness or drainage around the nails. Denies any acute changes since last appointment and no new complaints today. Denies any systemic complaints such as fevers, chills, nausea, vomiting.   Objective: AAO 3, NAD DP/PT pulses palpable, CRT less than 3 seconds Protective sensation intact with Simms Weinstein monofilament, Achilles tendon reflex intact.  Nails hypertrophic, dystrophic, elongated, brittle, discolored 10. There is tenderness overlying these nails. There is no surrounding erythema or drainage along the nail sites. There is a small amount of dried blood present within the right second toenail medially. Upon debridement of the nail there was a small superficial granular area measuring approximate 0.2 x 0.2 cm. This area of the toenail appears to be abutting the hallux causing pressure. Upon debridement of the nail there is no drainage or purulence. There is no surrounding erythema, ascending cellulitis, fluctuance, crepitus. No clinical signs of infection at this time. Hyperkeratotic lesions bilateral submetatarsal one and 5. Upon debridement lesions no underlying ulceration or clinical signs of infection. No open lesions or pre-ulcerative lesions are identified. No other areas of tenderness bilateral lower extremities. No overlying edema, erythema, increased warmth. No pain with calf compression, swelling, warmth, erythema.  Assessment: Patient presents with symptomatic onychomycosis, hyperkeratotic lesions 4  Plan: -Treatment options including alternatives, risks, complications were discussed -Nails sharply debrided 10 without complication. On the right second toe along the medial aspect upon  debridement of the nail there was a small amount of bleeding. Apply antibiotic ointment and a bandage daily. Monitor for any signs or symptoms of infection or any problems. If there is not healed within 2 weeks call the office for follow-up appointment sooner if any problems are to arise. Offloading pads were also dispensed. -Hyperkerotic lesions sharply debrided without complications/bleeding  -Discussed daily foot inspection. If there are any changes, to call the office immediately.  -Follow-up in 3 months or sooner if any problems are to arise. In the meantime, encouraged to call the office with any questions, concerns, changes symptoms.

## 2014-12-13 DIAGNOSIS — N183 Chronic kidney disease, stage 3 (moderate): Secondary | ICD-10-CM | POA: Diagnosis not present

## 2014-12-13 DIAGNOSIS — N189 Chronic kidney disease, unspecified: Secondary | ICD-10-CM | POA: Diagnosis not present

## 2014-12-13 DIAGNOSIS — N2581 Secondary hyperparathyroidism of renal origin: Secondary | ICD-10-CM | POA: Diagnosis not present

## 2014-12-19 DIAGNOSIS — I129 Hypertensive chronic kidney disease with stage 1 through stage 4 chronic kidney disease, or unspecified chronic kidney disease: Secondary | ICD-10-CM | POA: Diagnosis not present

## 2014-12-19 DIAGNOSIS — N2581 Secondary hyperparathyroidism of renal origin: Secondary | ICD-10-CM | POA: Diagnosis not present

## 2014-12-19 DIAGNOSIS — N183 Chronic kidney disease, stage 3 (moderate): Secondary | ICD-10-CM | POA: Diagnosis not present

## 2014-12-19 DIAGNOSIS — D631 Anemia in chronic kidney disease: Secondary | ICD-10-CM | POA: Diagnosis not present

## 2014-12-29 ENCOUNTER — Other Ambulatory Visit: Payer: Self-pay | Admitting: Internal Medicine

## 2015-01-10 ENCOUNTER — Ambulatory Visit (INDEPENDENT_AMBULATORY_CARE_PROVIDER_SITE_OTHER): Payer: Commercial Managed Care - HMO | Admitting: Internal Medicine

## 2015-01-10 ENCOUNTER — Other Ambulatory Visit: Payer: Self-pay

## 2015-01-10 ENCOUNTER — Encounter: Payer: Self-pay | Admitting: Internal Medicine

## 2015-01-10 VITALS — BP 152/84 | HR 70 | Temp 97.9°F | Wt 227.1 lb

## 2015-01-10 DIAGNOSIS — Z0000 Encounter for general adult medical examination without abnormal findings: Secondary | ICD-10-CM

## 2015-01-10 DIAGNOSIS — Z23 Encounter for immunization: Secondary | ICD-10-CM

## 2015-01-10 DIAGNOSIS — E119 Type 2 diabetes mellitus without complications: Secondary | ICD-10-CM

## 2015-01-10 MED ORDER — LISINOPRIL 20 MG PO TABS: 20.0000 mg | ORAL_TABLET | Freq: Every day | ORAL | Status: DC

## 2015-01-10 MED ORDER — AUTO-LANCET MISC: Status: DC

## 2015-01-10 MED ORDER — DILTIAZEM HCL ER 240 MG PO CP24: 240.0000 mg | ORAL_CAPSULE | Freq: Every day | ORAL | Status: DC

## 2015-01-10 MED ORDER — BLOOD GLUCOSE MONITOR KIT: PACK | Status: DC

## 2015-01-10 MED ORDER — PIOGLITAZONE HCL 30 MG PO TABS: 30.0000 mg | ORAL_TABLET | Freq: Every day | ORAL | Status: DC

## 2015-01-10 MED ORDER — LOVASTATIN 40 MG PO TABS: 40.0000 mg | ORAL_TABLET | Freq: Every day | ORAL | Status: DC

## 2015-01-10 MED ORDER — FUROSEMIDE 40 MG PO TABS: 40.0000 mg | ORAL_TABLET | Freq: Every day | ORAL | Status: DC

## 2015-01-10 MED ORDER — INSULIN GLARGINE 100 UNIT/ML ~~LOC~~ SOLN: 40.0000 [IU] | Freq: Every day | SUBCUTANEOUS | Status: DC

## 2015-01-10 MED ORDER — BLOOD GLUCOSE TEST VI STRP: ORAL_STRIP | Status: DC

## 2015-01-10 NOTE — Progress Notes (Signed)
Subjective:    Patient ID: Eric Wade, male    DOB: 03-29-1941, 74 y.o.   MRN: [id number]  HPI  Here for wellness and f/u;  Overall doing ok;  Pt denies Chest pain, worsening SOB, DOE, wheezing, orthopnea, PND, worsening LE edema, palpitations, dizziness or syncope.  Pt denies neurological change such as new headache, facial or extremity weakness.  Pt denies polydipsia, polyuria, or low sugar symptoms. Pt states overall good compliance with treatment and medications, good tolerability, and has been trying to follow appropriate diet.  Pt denies worsening depressive symptoms, suicidal ideation or panic. No fever, night sweats, wt loss, loss of appetite, or other constitutional symptoms.  Pt states good ability with ADL's, has low fall risk, home safety reviewed and adequate, no other significant changes in hearing or vision, and only occasionally active with exercise.Admits to some diet and medication noncompliance. No current complaints Past Medical History  Diagnosis Date  . DIABETES MELLITUS, TYPE II 05/08/2007  . HYPERLIPIDEMIA 05/08/2007  . GOUT 07/01/2007  . OBESITY 05/08/2007  . ERECTILE DYSFUNCTION 05/08/2007  . HYPERTENSION 05/08/2007  . RENAL INSUFFICIENCY 07/01/2007  . PSA, INCREASED 07/02/2008  . Preventative health care 12/28/2010  . Prostate cancer 07/02/2008    Qualifier: Diagnosis of  By: Jenny Reichmann MD, Hunt Oris    No past surgical history on file.  reports that he has quit smoking. He does not have any smokeless tobacco history on file. He reports that he does not drink alcohol or use illicit drugs. family history includes Hypertension in his other. Allergies  Allergen Reactions  . Nsaids     REACTION: renal insufficiency   Current Outpatient Prescriptions on File Prior to Visit  Medication Sig Dispense Refill  . amLODipine (NORVASC) 10 MG tablet     . aspirin 81 MG EC tablet Take 81 mg by mouth daily.       No current facility-administered medications on file prior to visit.     Review of Systems Constitutional: Negative for increased diaphoresis, other activity, appetite or siginficant weight change other than noted HENT: Negative for worsening hearing loss, ear pain, facial swelling, mouth sores and neck stiffness.   Eyes: Negative for other worsening pain, redness or visual disturbance.  Respiratory: Negative for shortness of breath and wheezing  Cardiovascular: Negative for chest pain and palpitations.  Gastrointestinal: Negative for diarrhea, blood in stool, abdominal distention or other pain Genitourinary: Negative for hematuria, flank pain or change in urine volume.  Musculoskeletal: Negative for myalgias or other joint complaints.  Skin: Negative for color change and wound or drainage.  Neurological: Negative for syncope and numbness. other than noted Hematological: Negative for adenopathy. or other swelling Psychiatric/Behavioral: Negative for hallucinations, SI, self-injury, decreased concentration or other worsening agitation.      Objective:   Physical Exam BP 152/84 mmHg  Pulse 70  Temp(Src) 97.9 F (36.6 C) (Oral)  Wt 227 lb 1.9 oz (103.021 kg)  SpO2 94% VS noted,  Constitutional: Pt is oriented to person, place, and time. Appears well-developed and well-nourished, in no significant distress Head: Normocephalic and atraumatic.  Right Ear: External ear normal.  Left Ear: External ear normal.  Nose: Nose normal.  Mouth/Throat: Oropharynx is clear and moist.  Eyes: Conjunctivae and EOM are normal. Pupils are equal, round, and reactive to light.  Neck: Normal range of motion. Neck supple. No JVD present. No tracheal deviation present or significant neck LA or mass Cardiovascular: Normal rate, regular rhythm, normal heart sounds and intact distal  pulses.   Pulmonary/Chest: Effort normal and breath sounds without rales or wheezing  Abdominal: Soft. Bowel sounds are normal. NT. No HSM  Musculoskeletal: Normal range of motion. Exhibits no  edema.  Lymphadenopathy:  Has no cervical adenopathy.  Neurological: Pt is alert and oriented to person, place, and time. Pt has normal reflexes. No cranial nerve deficit. Motor grossly intact Skin: Skin is warm and dry. No rash noted.  Psychiatric:  Has normal mood and affect. Behavior is normal.     Assessment & Plan:

## 2015-01-10 NOTE — Assessment & Plan Note (Signed)
Chronic mld uncontrolled, admits to some tx and diet noncompliacne, cont same tx, check a1c Lab Results  Component Value Date   HGBA1C 8.1* 07/12/2014

## 2015-01-10 NOTE — Addendum Note (Signed)
Addended by: Lowella Dandy on: 01/10/2015 02:07 PM   Modules accepted: Orders

## 2015-01-10 NOTE — Assessment & Plan Note (Signed)

## 2015-01-10 NOTE — Patient Instructions (Addendum)
You had the Td (tetanus shot) today  Please continue all other medications as before, and refills have been done if requested.  Please have the pharmacy call with any other refills you may need.  Please continue your efforts at being more active, low cholesterol diet, and weight control.  You are otherwise up to date with prevention measures today.  Please keep your appointments with your specialists as you may have planned  Please go to the LAB in the Basement (turn left off the elevator) for the tests to be done tomorrow at your request  You will be contacted by phone if any changes need to be made immediately.  Otherwise, you will receive a letter about your results with an explanation, but please check with MyChart first.  Please remember to sign up for MyChart if you have not done so, as this will be important to you in the future with finding out test results, communicating by private email, and scheduling acute appointments online when needed.  Please return in 6 months, or sooner if needed, with Lab testing done 3-5 days before

## 2015-01-10 NOTE — Progress Notes (Signed)
Pre visit review using our clinic review tool, if applicable. No additional management support is needed unless otherwise documented below in the visit note. 

## 2015-01-11 ENCOUNTER — Other Ambulatory Visit (INDEPENDENT_AMBULATORY_CARE_PROVIDER_SITE_OTHER): Payer: Commercial Managed Care - HMO

## 2015-01-11 ENCOUNTER — Encounter: Payer: Self-pay | Admitting: Internal Medicine

## 2015-01-11 DIAGNOSIS — E119 Type 2 diabetes mellitus without complications: Secondary | ICD-10-CM | POA: Diagnosis not present

## 2015-01-11 DIAGNOSIS — Z0000 Encounter for general adult medical examination without abnormal findings: Secondary | ICD-10-CM

## 2015-01-11 DIAGNOSIS — Z8546 Personal history of malignant neoplasm of prostate: Secondary | ICD-10-CM

## 2015-01-11 LAB — URINALYSIS, ROUTINE W REFLEX MICROSCOPIC
BILIRUBIN URINE: NEGATIVE
HGB URINE DIPSTICK: NEGATIVE
Ketones, ur: NEGATIVE
Leukocytes, UA: NEGATIVE
NITRITE: NEGATIVE
Specific Gravity, Urine: 1.02 (ref 1.000–1.030)
Total Protein, Urine: 30 — AB
Urine Glucose: >=1000 — AB
Urobilinogen, UA: 1 (ref 0.0–1.0)
pH: 6 (ref 5.0–8.0)

## 2015-01-11 LAB — CBC WITH DIFFERENTIAL/PLATELET
BASOS PCT: 0.4 % (ref 0.0–3.0)
Basophils Absolute: 0 10*3/uL (ref 0.0–0.1)
EOS PCT: 2 % (ref 0.0–5.0)
Eosinophils Absolute: 0.2 10*3/uL (ref 0.0–0.7)
HCT: 38.5 % — ABNORMAL LOW (ref 39.0–52.0)
Hemoglobin: 13.1 g/dL (ref 13.0–17.0)
LYMPHS ABS: 2.4 10*3/uL (ref 0.7–4.0)
LYMPHS PCT: 30.1 % (ref 12.0–46.0)
MCHC: 34 g/dL (ref 30.0–36.0)
MCV: 89.4 fl (ref 78.0–100.0)
MONO ABS: 0.8 10*3/uL (ref 0.1–1.0)
Monocytes Relative: 10.2 % (ref 3.0–12.0)
NEUTROS PCT: 57.3 % (ref 43.0–77.0)
Neutro Abs: 4.5 10*3/uL (ref 1.4–7.7)
Platelets: 195 10*3/uL (ref 150.0–400.0)
RBC: 4.31 Mil/uL (ref 4.22–5.81)
RDW: 14.2 % (ref 11.5–15.5)
WBC: 7.9 10*3/uL (ref 4.0–10.5)

## 2015-01-11 LAB — BASIC METABOLIC PANEL
BUN: 14 mg/dL (ref 6–23)
CO2: 28 mEq/L (ref 19–32)
Calcium: 9.8 mg/dL (ref 8.4–10.5)
Chloride: 101 mEq/L (ref 96–112)
Creatinine, Ser: 1.69 mg/dL — ABNORMAL HIGH (ref 0.40–1.50)
GFR: 50.95 mL/min — ABNORMAL LOW (ref >60.00)
Glucose, Bld: 295 mg/dL — ABNORMAL HIGH (ref 70–99)
POTASSIUM: 3.6 meq/L (ref 3.5–5.1)
Sodium: 138 mEq/L (ref 135–145)

## 2015-01-11 LAB — LIPID PANEL
Cholesterol: 163 mg/dL (ref 0–200)
HDL: 51 mg/dL (ref >39.00)
LDL CALC: 85 mg/dL (ref 0–99)
NONHDL: 112
Total CHOL/HDL Ratio: 3
Triglycerides: 133 mg/dL (ref 0.0–149.0)
VLDL: 26.6 mg/dL (ref 0.0–40.0)

## 2015-01-11 LAB — HEPATIC FUNCTION PANEL
ALK PHOS: 78 U/L (ref 39–117)
ALT: 16 U/L (ref 0–53)
AST: 27 U/L (ref 0–37)
Albumin: 3.9 g/dL (ref 3.5–5.2)
Bilirubin, Direct: 0.1 mg/dL (ref 0.0–0.3)
TOTAL PROTEIN: 7.1 g/dL (ref 6.0–8.3)
Total Bilirubin: 0.5 mg/dL (ref 0.2–1.2)

## 2015-01-11 LAB — TSH: TSH: 1.09 u[IU]/mL (ref 0.35–4.50)

## 2015-01-11 LAB — MICROALBUMIN / CREATININE URINE RATIO
CREATININE, U: 217.7 mg/dL
MICROALB/CREAT RATIO: 5.5 mg/g (ref 0.0–30.0)
Microalb, Ur: 11.9 mg/dL — ABNORMAL HIGH (ref 0.0–1.9)

## 2015-01-11 LAB — PSA: PSA: 0.12 ng/mL (ref 0.10–4.00)

## 2015-01-11 LAB — HEMOGLOBIN A1C: Hgb A1c MFr Bld: 8.7 % — ABNORMAL HIGH (ref 4.6–6.5)

## 2015-01-14 ENCOUNTER — Telehealth: Payer: Self-pay

## 2015-01-14 NOTE — Telephone Encounter (Signed)
Pharmacy would like clarification on Lantus and whether or not it is vials or pens.

## 2015-01-15 NOTE — Telephone Encounter (Signed)
Pens would be fine, thanks

## 2015-01-16 MED ORDER — INSULIN GLARGINE 100 UNIT/ML SOLOSTAR PEN: 40.0000 [IU] | PEN_INJECTOR | Freq: Every day | SUBCUTANEOUS | Status: DC

## 2015-01-16 NOTE — Telephone Encounter (Signed)
erx to [facility] done.

## 2015-02-06 ENCOUNTER — Ambulatory Visit: Payer: Commercial Managed Care - HMO | Admitting: Podiatry

## 2015-02-20 DIAGNOSIS — C7951 Secondary malignant neoplasm of bone: Secondary | ICD-10-CM | POA: Diagnosis not present

## 2015-02-20 DIAGNOSIS — M8580 Other specified disorders of bone density and structure, unspecified site: Secondary | ICD-10-CM | POA: Diagnosis not present

## 2015-02-20 DIAGNOSIS — C61 Malignant neoplasm of prostate: Secondary | ICD-10-CM | POA: Diagnosis not present

## 2015-05-08 DIAGNOSIS — N2581 Secondary hyperparathyroidism of renal origin: Secondary | ICD-10-CM | POA: Diagnosis not present

## 2015-05-08 DIAGNOSIS — N189 Chronic kidney disease, unspecified: Secondary | ICD-10-CM | POA: Diagnosis not present

## 2015-05-08 DIAGNOSIS — N183 Chronic kidney disease, stage 3 (moderate): Secondary | ICD-10-CM | POA: Diagnosis not present

## 2015-05-09 DIAGNOSIS — N183 Chronic kidney disease, stage 3 (moderate): Secondary | ICD-10-CM | POA: Diagnosis not present

## 2015-05-09 DIAGNOSIS — I129 Hypertensive chronic kidney disease with stage 1 through stage 4 chronic kidney disease, or unspecified chronic kidney disease: Secondary | ICD-10-CM | POA: Diagnosis not present

## 2015-05-09 DIAGNOSIS — N2581 Secondary hyperparathyroidism of renal origin: Secondary | ICD-10-CM | POA: Diagnosis not present

## 2015-05-09 DIAGNOSIS — D631 Anemia in chronic kidney disease: Secondary | ICD-10-CM | POA: Diagnosis not present

## 2015-05-23 ENCOUNTER — Other Ambulatory Visit: Payer: Self-pay | Admitting: Physical Medicine and Rehabilitation

## 2015-05-23 DIAGNOSIS — M8580 Other specified disorders of bone density and structure, unspecified site: Secondary | ICD-10-CM

## 2015-06-19 ENCOUNTER — Telehealth: Payer: Self-pay | Admitting: Internal Medicine

## 2015-06-19 NOTE — Telephone Encounter (Signed)
Patient is requesting a [facility] referral to [facility]. Dr Alinda Money. He has an appt on 06/26/2015

## 2015-06-20 NOTE — Telephone Encounter (Signed)
Left msg to make pt aware.

## 2015-06-20 NOTE — Telephone Encounter (Signed)
Mcarthur Rossetti Josem Kaufmann #[id number] valid 06/26/2015 - 12/23/2015 for 6 visits

## 2015-06-26 DIAGNOSIS — C61 Malignant neoplasm of prostate: Secondary | ICD-10-CM | POA: Diagnosis not present

## 2015-06-26 DIAGNOSIS — M8580 Other specified disorders of bone density and structure, unspecified site: Secondary | ICD-10-CM | POA: Diagnosis not present

## 2015-06-26 DIAGNOSIS — C7951 Secondary malignant neoplasm of bone: Secondary | ICD-10-CM | POA: Diagnosis not present

## 2015-06-27 ENCOUNTER — Inpatient Hospital Stay: Admission: RE | Admit: 2015-06-27 | Payer: Commercial Managed Care - HMO | Source: Ambulatory Visit

## 2015-07-17 ENCOUNTER — Other Ambulatory Visit (INDEPENDENT_AMBULATORY_CARE_PROVIDER_SITE_OTHER): Payer: Commercial Managed Care - HMO

## 2015-07-17 ENCOUNTER — Encounter: Payer: Self-pay | Admitting: Internal Medicine

## 2015-07-17 ENCOUNTER — Ambulatory Visit (INDEPENDENT_AMBULATORY_CARE_PROVIDER_SITE_OTHER): Payer: Commercial Managed Care - HMO | Admitting: Internal Medicine

## 2015-07-17 VITALS — BP 174/82 | HR 77 | Temp 98.5°F | Ht 74.0 in | Wt 226.0 lb

## 2015-07-17 DIAGNOSIS — E785 Hyperlipidemia, unspecified: Secondary | ICD-10-CM

## 2015-07-17 DIAGNOSIS — E119 Type 2 diabetes mellitus without complications: Secondary | ICD-10-CM | POA: Diagnosis not present

## 2015-07-17 DIAGNOSIS — Z23 Encounter for immunization: Secondary | ICD-10-CM

## 2015-07-17 DIAGNOSIS — Z0189 Encounter for other specified special examinations: Secondary | ICD-10-CM

## 2015-07-17 DIAGNOSIS — N183 Chronic kidney disease, stage 3 unspecified: Secondary | ICD-10-CM

## 2015-07-17 DIAGNOSIS — Z0000 Encounter for general adult medical examination without abnormal findings: Secondary | ICD-10-CM

## 2015-07-17 DIAGNOSIS — I10 Essential (primary) hypertension: Secondary | ICD-10-CM

## 2015-07-17 LAB — HEPATIC FUNCTION PANEL
ALT: 14 U/L (ref 0–53)
AST: 26 U/L (ref 0–37)
Albumin: 4.3 g/dL (ref 3.5–5.2)
Alkaline Phosphatase: 79 U/L (ref 39–117)
BILIRUBIN DIRECT: 0.1 mg/dL (ref 0.0–0.3)
TOTAL PROTEIN: 7.8 g/dL (ref 6.0–8.3)
Total Bilirubin: 0.4 mg/dL (ref 0.2–1.2)

## 2015-07-17 LAB — LIPID PANEL
CHOL/HDL RATIO: 3
Cholesterol: 161 mg/dL (ref 0–200)
HDL: 51.8 mg/dL (ref >39.00)
LDL CALC: 77 mg/dL (ref 0–99)
NONHDL: 109.48
Triglycerides: 164 mg/dL — ABNORMAL HIGH (ref 0.0–149.0)
VLDL: 32.8 mg/dL (ref 0.0–40.0)

## 2015-07-17 LAB — HEMOGLOBIN A1C: HEMOGLOBIN A1C: 7.3 % — AB (ref 4.6–6.5)

## 2015-07-17 LAB — BASIC METABOLIC PANEL
BUN: 21 mg/dL (ref 6–23)
CALCIUM: 9.7 mg/dL (ref 8.4–10.5)
CO2: 29 meq/L (ref 19–32)
CREATININE: 1.68 mg/dL — AB (ref 0.40–1.50)
Chloride: 104 mEq/L (ref 96–112)
GFR: 51.23 mL/min — AB (ref >60.00)
Glucose, Bld: 181 mg/dL — ABNORMAL HIGH (ref 70–99)
Potassium: 3.7 mEq/L (ref 3.5–5.1)
Sodium: 140 mEq/L (ref 135–145)

## 2015-07-17 NOTE — Assessment & Plan Note (Signed)
stable overall by history and exam, recent data reviewed with pt, and pt to continue medical treatment as before,  to f/u any worsening symptoms or concerns   Lab Results  Component Value Date   HGBA1C 8.7* 01/11/2015   For fu lab today

## 2015-07-17 NOTE — Progress Notes (Signed)
 Subjective:    Patient ID: Eric Wade, male    DOB: 03/15/1941, 74 y.o.   MRN: [id number]  HPI  Here to f/u; overall doing ok,  Pt denies chest pain, increasing sob or doe, wheezing, orthopnea, PND, increased LE swelling, palpitations, dizziness or syncope.  Pt denies new neurological symptoms such as new headache, or facial or extremity weakness or numbness.  Pt denies polydipsia, polyuria, or low sugar episode.   Pt denies new neurological symptoms such as new headache, or facial or extremity weakness or numbness.   Pt states overall good compliance with meds, mostly trying to follow appropriate diet, with wt overall stable,  but little exercise however. BP frequently elevated at doctor offices, does also check BP at home or sbp 150.  States fair compliance with all meds this time, but sometimes forgets on days he takes his wife to dialysis.  Sees renal - overall stable per pt. Sees Dr Borden, PSA doing well on current tx.  Past Medical History  Diagnosis Date  . DIABETES MELLITUS, TYPE II 05/08/2007  . HYPERLIPIDEMIA 05/08/2007  . GOUT 07/01/2007  . OBESITY 05/08/2007  . ERECTILE DYSFUNCTION 05/08/2007  . HYPERTENSION 05/08/2007  . RENAL INSUFFICIENCY 07/01/2007  . PSA, INCREASED 07/02/2008  . Preventative health care 12/28/2010  . Prostate cancer (HCC) 07/02/2008    Qualifier: Diagnosis of  By:  MD,  W    No past surgical history on file.  reports that he has quit smoking. He does not have any smokeless tobacco history on file. He reports that he does not drink alcohol or use illicit drugs. family history includes Hypertension in his other. Allergies  Allergen Reactions  . Nsaids     REACTION: renal insufficiency   Current Outpatient Prescriptions on File Prior to Visit  Medication Sig Dispense Refill  . amLODipine (NORVASC) 10 MG tablet     . aspirin 81 MG EC tablet Take 81 mg by mouth daily.      . blood glucose meter kit and supplies KIT Pt receives from [facility] 1 each 0    . diltiazem (DILT-XR) 240 MG 24 hr capsule Take 1 capsule (240 mg total) by mouth daily. 90 capsule 3  . furosemide (LASIX) 40 MG tablet Take 1 tablet (40 mg total) by mouth daily. 90 tablet 3  . Glucose Blood (BLOOD GLUCOSE TEST STRIPS) STRP Pt receives from [facility] 1 each 0  . Insulin Glargine (LANTUS) 100 UNIT/ML Solostar Pen Inject 40 Units into the skin daily at 10 pm. 45 mL 3  . Lancet Devices (AUTO-LANCET) MISC Pt received from [facility] 1 each 0  . lisinopril (PRINIVIL,ZESTRIL) 20 MG tablet Take 1 tablet (20 mg total) by mouth daily. 90 tablet 3  . lovastatin (MEVACOR) 40 MG tablet Take 1 tablet (40 mg total) by mouth daily. 90 tablet 3  . pioglitazone (ACTOS) 30 MG tablet Take 1 tablet (30 mg total) by mouth daily. 90 tablet 3   No current facility-administered medications on file prior to visit.    Review of Systems  Constitutional: Negative for unusual diaphoresis or night sweats HENT: Negative for ringing in ear or discharge Eyes: Negative for double vision or worsening visual disturbance.  Respiratory: Negative for choking and stridor.   Gastrointestinal: Negative for vomiting or other signifcant bowel change Genitourinary: Negative for hematuria or change in urine volume.  Musculoskeletal: Negative for other MSK pain or swelling Skin: Negative for color change and worsening wound.  Neurological: Negative for tremors and numbness   other than noted  Psychiatric/Behavioral: Negative for decreased concentration or agitation other than above  '    Objective:   Physical Exam BP 174/82 mmHg  Pulse 77  Temp(Src) 98.5 F (36.9 C) (Oral)  Ht 6' 2" (1.88 m)  Wt 226 lb (102.513 kg)  BMI 29.00 kg/m2  SpO2 96% VS noted,  Constitutional: Pt appears in no significant distress HENT: Head: NCAT.  Right Ear: External ear normal.  Left Ear: External ear normal.  Eyes: . Pupils are equal, round, and reactive to light. Conjunctivae and EOM are normal Neck: Normal range of motion.  Neck supple.  Cardiovascular: Normal rate and regular rhythm.   Pulmonary/Chest: Effort normal and breath sounds without rales or wheezing.  Abd:  Soft, NT, ND, + BS Neurological: Pt is alert. Not confused , motor grossly intact Skin: Skin is warm. No rash, no LE edema Psychiatric: Pt behavior is normal. No agitation.     Assessment & Plan:

## 2015-07-17 NOTE — Assessment & Plan Note (Signed)
stable overall by history and exam, recent data reviewed with pt, and pt to continue medical treatment as before,  to f/u any worsening symptoms or concerns Lab Results  Component Value Date   LDLCALC 85 01/11/2015

## 2015-07-17 NOTE — Assessment & Plan Note (Signed)
stable overall by history and exam, recent data reviewed with pt, and pt to continue medical treatment as before,  to f/u any worsening symptoms or concerns Lab Results  Component Value Date   CREATININE 1.69* 01/11/2015

## 2015-07-17 NOTE — Progress Notes (Signed)
Pre visit review using our clinic review tool, if applicable. No additional management support is needed unless otherwise documented below in the visit note. 

## 2015-07-17 NOTE — Assessment & Plan Note (Signed)
Mild elev, pt declines any med changes for now, wary of side effect, / to f/u any worsening symptoms or concerns  BP Readings from Last 3 Encounters:  07/17/15 174/82  01/10/15 152/84  10/31/14 171/83

## 2015-07-17 NOTE — Patient Instructions (Signed)
Please continue all other medications as before, and refills have been done if requested.  Please have the pharmacy call with any other refills you may need.  Please continue your efforts at being more active, low cholesterol diet, and weight control.  You are otherwise up to date with prevention measures today.  Please keep your appointments with your specialists as you may have planned  Continue to monitor your Blood Pressure at home, and call or return for persistent BP > 140/90  Please go to the LAB in the Basement (turn left off the elevator) for the tests to be done today  You will be contacted by phone if any changes need to be made immediately.  Otherwise, you will receive a letter about your results with an explanation, but please check with MyChart first.  Please remember to sign up for MyChart if you have not done so, as this will be important to you in the future with finding out test results, communicating by private email, and scheduling acute appointments online when needed.  Please return in 6 months, or sooner if needed, with Lab testing done 3-5 days before

## 2015-07-30 ENCOUNTER — Inpatient Hospital Stay: Admission: RE | Admit: 2015-07-30 | Payer: Commercial Managed Care - HMO | Source: Ambulatory Visit

## 2015-10-23 DIAGNOSIS — C61 Malignant neoplasm of prostate: Secondary | ICD-10-CM | POA: Diagnosis not present

## 2015-10-24 DIAGNOSIS — C61 Malignant neoplasm of prostate: Secondary | ICD-10-CM | POA: Diagnosis not present

## 2015-10-31 DIAGNOSIS — C7951 Secondary malignant neoplasm of bone: Secondary | ICD-10-CM | POA: Diagnosis not present

## 2015-10-31 DIAGNOSIS — C61 Malignant neoplasm of prostate: Secondary | ICD-10-CM | POA: Diagnosis not present

## 2015-10-31 DIAGNOSIS — M8580 Other specified disorders of bone density and structure, unspecified site: Secondary | ICD-10-CM | POA: Diagnosis not present

## 2015-11-01 ENCOUNTER — Other Ambulatory Visit: Payer: Self-pay | Admitting: Urology

## 2015-11-01 DIAGNOSIS — Z79899 Other long term (current) drug therapy: Secondary | ICD-10-CM

## 2015-11-01 DIAGNOSIS — C61 Malignant neoplasm of prostate: Secondary | ICD-10-CM

## 2015-11-07 DIAGNOSIS — N2581 Secondary hyperparathyroidism of renal origin: Secondary | ICD-10-CM | POA: Diagnosis not present

## 2015-11-07 DIAGNOSIS — I129 Hypertensive chronic kidney disease with stage 1 through stage 4 chronic kidney disease, or unspecified chronic kidney disease: Secondary | ICD-10-CM | POA: Diagnosis not present

## 2015-11-07 DIAGNOSIS — N189 Chronic kidney disease, unspecified: Secondary | ICD-10-CM | POA: Diagnosis not present

## 2015-11-07 DIAGNOSIS — D631 Anemia in chronic kidney disease: Secondary | ICD-10-CM | POA: Diagnosis not present

## 2015-11-07 DIAGNOSIS — N183 Chronic kidney disease, stage 3 (moderate): Secondary | ICD-10-CM | POA: Diagnosis not present

## 2015-11-19 ENCOUNTER — Ambulatory Visit
Admission: RE | Admit: 2015-11-19 | Discharge: 2015-11-19 | Disposition: A | Discharge status: Home / Self Care | Payer: Commercial Managed Care - HMO | Source: Ambulatory Visit | Attending: Urology | Admitting: Urology

## 2015-11-19 DIAGNOSIS — M810 Age-related osteoporosis without current pathological fracture: Secondary | ICD-10-CM | POA: Diagnosis not present

## 2015-11-19 DIAGNOSIS — Z79899 Other long term (current) drug therapy: Secondary | ICD-10-CM

## 2015-11-19 DIAGNOSIS — C61 Malignant neoplasm of prostate: Secondary | ICD-10-CM

## 2016-01-14 ENCOUNTER — Telehealth: Payer: Self-pay

## 2016-01-14 ENCOUNTER — Other Ambulatory Visit: Payer: Self-pay

## 2016-01-14 ENCOUNTER — Ambulatory Visit: Payer: Commercial Managed Care - HMO | Admitting: Internal Medicine

## 2016-01-14 DIAGNOSIS — Z0289 Encounter for other administrative examinations: Secondary | ICD-10-CM

## 2016-01-14 MED ORDER — DILTIAZEM HCL ER 240 MG PO CP24: 240.0000 mg | ORAL_CAPSULE | Freq: Every day | ORAL | Status: DC

## 2016-01-14 MED ORDER — FUROSEMIDE 40 MG PO TABS: 40.0000 mg | ORAL_TABLET | Freq: Every day | ORAL | Status: DC

## 2016-01-14 MED ORDER — PIOGLITAZONE HCL 30 MG PO TABS: 30.0000 mg | ORAL_TABLET | Freq: Every day | ORAL | Status: DC

## 2016-01-14 MED ORDER — LISINOPRIL 20 MG PO TABS: 20.0000 mg | ORAL_TABLET | Freq: Every day | ORAL | Status: DC

## 2016-01-14 MED ORDER — LOVASTATIN 40 MG PO TABS: 40.0000 mg | ORAL_TABLET | Freq: Every day | ORAL | Status: DC

## 2016-01-14 NOTE — Telephone Encounter (Signed)
Medication refills sent to pharmacy 

## 2016-03-18 DIAGNOSIS — C7951 Secondary malignant neoplasm of bone: Secondary | ICD-10-CM | POA: Diagnosis not present

## 2016-03-18 DIAGNOSIS — M810 Age-related osteoporosis without current pathological fracture: Secondary | ICD-10-CM | POA: Diagnosis not present

## 2016-03-18 DIAGNOSIS — C61 Malignant neoplasm of prostate: Secondary | ICD-10-CM | POA: Diagnosis not present

## 2016-06-02 DIAGNOSIS — N189 Chronic kidney disease, unspecified: Secondary | ICD-10-CM | POA: Diagnosis not present

## 2016-06-02 DIAGNOSIS — N183 Chronic kidney disease, stage 3 (moderate): Secondary | ICD-10-CM | POA: Diagnosis not present

## 2016-06-02 DIAGNOSIS — N2581 Secondary hyperparathyroidism of renal origin: Secondary | ICD-10-CM | POA: Diagnosis not present

## 2016-06-04 DIAGNOSIS — Z6831 Body mass index (BMI) 31.0-31.9, adult: Secondary | ICD-10-CM | POA: Diagnosis not present

## 2016-06-04 DIAGNOSIS — N183 Chronic kidney disease, stage 3 (moderate): Secondary | ICD-10-CM | POA: Diagnosis not present

## 2016-06-04 DIAGNOSIS — D631 Anemia in chronic kidney disease: Secondary | ICD-10-CM | POA: Diagnosis not present

## 2016-06-04 DIAGNOSIS — I129 Hypertensive chronic kidney disease with stage 1 through stage 4 chronic kidney disease, or unspecified chronic kidney disease: Secondary | ICD-10-CM | POA: Diagnosis not present

## 2016-06-04 DIAGNOSIS — N2581 Secondary hyperparathyroidism of renal origin: Secondary | ICD-10-CM | POA: Diagnosis not present

## 2016-08-21 DIAGNOSIS — C7951 Secondary malignant neoplasm of bone: Secondary | ICD-10-CM | POA: Diagnosis not present

## 2016-08-21 DIAGNOSIS — C61 Malignant neoplasm of prostate: Secondary | ICD-10-CM | POA: Diagnosis not present

## 2016-08-21 DIAGNOSIS — M810 Age-related osteoporosis without current pathological fracture: Secondary | ICD-10-CM | POA: Diagnosis not present

## 2016-08-25 ENCOUNTER — Other Ambulatory Visit (INDEPENDENT_AMBULATORY_CARE_PROVIDER_SITE_OTHER): Payer: Commercial Managed Care - HMO

## 2016-08-25 ENCOUNTER — Ambulatory Visit (INDEPENDENT_AMBULATORY_CARE_PROVIDER_SITE_OTHER): Payer: Commercial Managed Care - HMO | Admitting: Internal Medicine

## 2016-08-25 ENCOUNTER — Encounter: Payer: Self-pay | Admitting: Internal Medicine

## 2016-08-25 VITALS — BP 140/80 | HR 87 | Temp 98.1°F | Resp 20 | Wt 227.0 lb

## 2016-08-25 DIAGNOSIS — N183 Chronic kidney disease, stage 3 unspecified: Secondary | ICD-10-CM

## 2016-08-25 DIAGNOSIS — Z0001 Encounter for general adult medical examination with abnormal findings: Secondary | ICD-10-CM

## 2016-08-25 DIAGNOSIS — E119 Type 2 diabetes mellitus without complications: Secondary | ICD-10-CM

## 2016-08-25 DIAGNOSIS — Z23 Encounter for immunization: Secondary | ICD-10-CM

## 2016-08-25 DIAGNOSIS — J309 Allergic rhinitis, unspecified: Secondary | ICD-10-CM | POA: Diagnosis not present

## 2016-08-25 HISTORY — DX: Allergic rhinitis, unspecified: J30.9

## 2016-08-25 LAB — CBC WITH DIFFERENTIAL/PLATELET
Basophils Absolute: 0 10*3/uL (ref 0.0–0.1)
Basophils Relative: 0.5 % (ref 0.0–3.0)
EOS PCT: 1.7 % (ref 0.0–5.0)
Eosinophils Absolute: 0.1 10*3/uL (ref 0.0–0.7)
HCT: 39.5 % (ref 39.0–52.0)
HEMOGLOBIN: 13.5 g/dL (ref 13.0–17.0)
LYMPHS ABS: 3.2 10*3/uL (ref 0.7–4.0)
Lymphocytes Relative: 42.6 % (ref 12.0–46.0)
MCHC: 34.1 g/dL (ref 30.0–36.0)
MCV: 90.5 fl (ref 78.0–100.0)
MONOS PCT: 9.3 % (ref 3.0–12.0)
Monocytes Absolute: 0.7 10*3/uL (ref 0.1–1.0)
NEUTROS PCT: 45.9 % (ref 43.0–77.0)
Neutro Abs: 3.4 10*3/uL (ref 1.4–7.7)
Platelets: 182 10*3/uL (ref 150.0–400.0)
RBC: 4.36 Mil/uL (ref 4.22–5.81)
RDW: 14.1 % (ref 11.5–15.5)
WBC: 7.5 10*3/uL (ref 4.0–10.5)

## 2016-08-25 LAB — LIPID PANEL
CHOLESTEROL: 155 mg/dL (ref 0–200)
HDL: 49.7 mg/dL (ref >39.00)
LDL CALC: 82 mg/dL (ref 0–99)
NonHDL: 104.99
Total CHOL/HDL Ratio: 3
Triglycerides: 113 mg/dL (ref 0.0–149.0)
VLDL: 22.6 mg/dL (ref 0.0–40.0)

## 2016-08-25 LAB — BASIC METABOLIC PANEL
BUN: 16 mg/dL (ref 6–23)
CALCIUM: 9 mg/dL (ref 8.4–10.5)
CO2: 26 meq/L (ref 19–32)
CREATININE: 1.53 mg/dL — AB (ref 0.40–1.50)
Chloride: 105 mEq/L (ref 96–112)
GFR: 56.9 mL/min — AB (ref >60.00)
GLUCOSE: 127 mg/dL — AB (ref 70–99)
Potassium: 4 mEq/L (ref 3.5–5.1)
Sodium: 141 mEq/L (ref 135–145)

## 2016-08-25 LAB — HEPATIC FUNCTION PANEL
ALBUMIN: 4.5 g/dL (ref 3.5–5.2)
ALK PHOS: 70 U/L (ref 39–117)
ALT: 15 U/L (ref 0–53)
AST: 31 U/L (ref 0–37)
Bilirubin, Direct: 0.1 mg/dL (ref 0.0–0.3)
TOTAL PROTEIN: 7.4 g/dL (ref 6.0–8.3)
Total Bilirubin: 0.4 mg/dL (ref 0.2–1.2)

## 2016-08-25 LAB — PSA: PSA: 0.3 ng/mL (ref 0.10–4.00)

## 2016-08-25 LAB — HEMOGLOBIN A1C: Hgb A1c MFr Bld: 6.4 % (ref 4.6–6.5)

## 2016-08-25 LAB — TSH: TSH: 1.3 u[IU]/mL (ref 0.35–4.50)

## 2016-08-25 NOTE — Assessment & Plan Note (Signed)
stable overall by history and exam, recent data reviewed with pt, and pt to continue medical treatment as before,  to f/u any worsening symptoms or concerns Lab Results  Component Value Date   HGBA1C 7.3 (H) 07/17/2015   For f/u lab today

## 2016-08-25 NOTE — Assessment & Plan Note (Signed)
Also for start otc claritin and nasacort asd,  to f/u any worsening symptoms or concerns  In addition to the time spent performing CPE, I spent an additional 15 minutes face to face,in which greater than 50% of this time was spent in counseling and coordination of care for patient's illness as documented.

## 2016-08-25 NOTE — Assessment & Plan Note (Signed)

## 2016-08-25 NOTE — Progress Notes (Signed)
Pre visit review using our clinic review tool, if applicable. No additional management support is needed unless otherwise documented below in the visit note. 

## 2016-08-25 NOTE — Patient Instructions (Addendum)
You had the flu shot today  Please take Claritin (loratidine) 10 mg per day as needed for allergies, as well as OTC Nasacort as needed  Please continue all other medications as before, and refills have been done if requested.  Please have the pharmacy call with any other refills you may need.  Please continue your efforts at being more active, low cholesterol diet, and weight control.  You are otherwise up to date with prevention measures today.  Please keep your appointments with your specialists as you may have planned  Please go to the LAB in the Basement (turn left off the elevator) for the tests to be done today  You will be contacted by phone if any changes need to be made immediately.  Otherwise, you will receive a letter about your results with an explanation, but please check with MyChart first.  Please remember to sign up for MyChart if you have not done so, as this will be important to you in the future with finding out test results, communicating by private email, and scheduling acute appointments online when needed.  Please return in 6 months, or sooner if needed, with Lab testing done 3-5 days before

## 2016-08-25 NOTE — Progress Notes (Signed)
Subjective:    Patient ID: Eric Wade, male    DOB: August 10, 1941, 75 y.o.   MRN: [id number]  HPI  Here for wellness and f/u;  Overall doing ok;  Pt denies Chest pain, worsening SOB, DOE, wheezing, orthopnea, PND, worsening LE edema, palpitations, dizziness or syncope.  Pt denies neurological change such as new headache, facial or extremity weakness.  . Pt states overall good compliance with treatment and medications, good tolerability, and has been trying to follow appropriate diet.  Pt denies worsening depressive symptoms, suicidal ideation or panic. No fever, night sweats, wt loss, loss of appetite, or other constitutional symptoms.  Pt states good ability with ADL's, has low fall risk, home safety reviewed and adequate, no other significant changes in hearing or vision, and only occasionally active with exercise. Wife has dialysis 3 time weekly so he spends most of his time supporting that. Wt Readings from Last 3 Encounters:  08/25/16 227 lb (103 kg)  07/17/15 226 lb (102.5 kg)  01/10/15 227 lb 1.9 oz (103 kg)  Does have several wks ongoing nasal allergy symptoms with clearish congestion, itch and sneezing, without fever, pain, ST, cough, swelling or wheezing.  Pt denies polydipsia, polyuria, or low sugar symptoms such as weakness or confusion improved with po intake.  Pt states overall good compliance with meds, trying to follow lower cholesterol, diabetic diet, wt overall stable.  Missed 6 mo OV f/u.  No other new history  Past Medical History:  Diagnosis Date  . Allergic rhinitis 08/25/2016  . DIABETES MELLITUS, TYPE II 05/08/2007  . ERECTILE DYSFUNCTION 05/08/2007  . GOUT 07/01/2007  . HYPERLIPIDEMIA 05/08/2007  . HYPERTENSION 05/08/2007  . OBESITY 05/08/2007  . Preventative health care 12/28/2010  . Prostate cancer ([place]) 07/02/2008   Qualifier: Diagnosis of  By: Jenny Reichmann MD, Hunt Oris   . PSA, INCREASED 07/02/2008  . RENAL INSUFFICIENCY 07/01/2007   No past surgical history on file.  reports  that he has quit smoking. He does not have any smokeless tobacco history on file. He reports that he does not drink alcohol or use drugs. family history includes Hypertension in his other. Allergies  Allergen Reactions  . Nsaids     REACTION: renal insufficiency   Current Outpatient Prescriptions on File Prior to Visit  Medication Sig Dispense Refill  . amLODipine (NORVASC) 10 MG tablet     . aspirin 81 MG EC tablet Take 81 mg by mouth daily.      . blood glucose meter kit and supplies KIT Pt receives from [facility] 1 each 0  . diltiazem (DILT-XR) 240 MG 24 hr capsule Take 1 capsule (240 mg total) by mouth daily. 90 capsule 3  . furosemide (LASIX) 40 MG tablet Take 1 tablet (40 mg total) by mouth daily. 90 tablet 3  . Glucose Blood (BLOOD GLUCOSE TEST STRIPS) STRP Pt receives from [facility] 1 each 0  . Insulin Glargine (LANTUS) 100 UNIT/ML Solostar Pen Inject 40 Units into the skin daily at 10 pm. 45 mL 3  . Lancet Devices (AUTO-LANCET) MISC Pt received from [facility] 1 each 0  . lisinopril (PRINIVIL,ZESTRIL) 20 MG tablet Take 1 tablet (20 mg total) by mouth daily. 90 tablet 3  . lovastatin (MEVACOR) 40 MG tablet Take 1 tablet (40 mg total) by mouth daily. 90 tablet 3  . pioglitazone (ACTOS) 30 MG tablet Take 1 tablet (30 mg total) by mouth daily. 90 tablet 3   No current facility-administered medications on file prior to visit.  Review of Systems Constitutional: Negative for increased diaphoresis, or other activity, appetite or siginficant weight change other than noted HENT: Negative for worsening hearing loss, ear pain, facial swelling, mouth sores and neck stiffness.   Eyes: Negative for other worsening pain, redness or visual disturbance.  Respiratory: Negative for choking or stridor Cardiovascular: Negative for other chest pain and palpitations.  Gastrointestinal: Negative for worsening diarrhea, blood in stool, or abdominal distention Genitourinary: Negative for hematuria, flank  pain or change in urine volume.  Musculoskeletal: Negative for myalgias or other joint complaints.  Skin: Negative for other color change and wound or drainage.  Neurological: Negative for syncope and numbness. other than noted Hematological: Negative for adenopathy. or other swelling Psychiatric/Behavioral: Negative for hallucinations, SI, self-injury, decreased concentration or other worsening agitation.  All other system neg per pt    Objective:   Physical Exam BP 140/80   Pulse 87   Temp 98.1 F (36.7 C) (Oral)   Resp 20   Wt 227 lb (103 kg)   SpO2 94%   BMI 29.15 kg/m  VS noted, not ill appearing Constitutional: Pt is oriented to person, place, and time. Appears well-developed and well-nourished, in no significant distress Head: Normocephalic and atraumatic  Eyes: Conjunctivae and EOM are normal. Pupils are equal, round, and reactive to light Right Ear: External ear normal.  Left Ear: External ear normal Nose: Nose normal.  Bilat tm's with mild erythema.  Max sinus areas non tender.  Pharynx with mild erythema, no exudate Mouth/Throat: Oropharynx is clear and moist  Neck: Normal range of motion. Neck supple. No JVD present. No tracheal deviation present or significant neck LA or mass Cardiovascular: Normal rate, regular rhythm, normal heart sounds and intact distal pulses.   Pulmonary/Chest: Effort normal and breath sounds without rales or wheezing  Abdominal: Soft. Bowel sounds are normal. NT. No HSM  Musculoskeletal: Normal range of motion. Exhibits no edema Lymphadenopathy: Has no cervical adenopathy.  Neurological: Pt is alert and oriented to person, place, and time. Pt has normal reflexes. No cranial nerve deficit. Motor grossly intact Skin: Skin is warm and dry. No rash noted or new ulcers Psychiatric:  Has normal mood and affect. Behavior is normal.  No other new exam findings Lab Results  Component Value Date   WBC 7.9 01/11/2015   HGB 13.1 01/11/2015   HCT 38.5  (L) 01/11/2015   PLT 195.0 01/11/2015   GLUCOSE 181 (H) 07/17/2015   CHOL 161 07/17/2015   TRIG 164.0 (H) 07/17/2015   HDL 51.80 07/17/2015   LDLDIRECT 72.5 12/30/2010   LDLCALC 77 07/17/2015   ALT 14 07/17/2015   AST 26 07/17/2015   NA 140 07/17/2015   K 3.7 07/17/2015   CL 104 07/17/2015   CREATININE 1.68 (H) 07/17/2015   BUN 21 07/17/2015   CO2 29 07/17/2015   TSH 1.09 01/11/2015   PSA 0.12 01/11/2015   HGBA1C 7.3 (H) 07/17/2015   MICROALBUR 11.9 (H) 01/11/2015       Assessment & Plan:

## 2016-08-25 NOTE — Assessment & Plan Note (Signed)
stable overall by history and exam, recent data reviewed with pt, and pt to continue medical treatment as before,  to f/u any worsening symptoms or concerns BP Readings from Last 3 Encounters:  08/25/16 140/80  07/17/15 (!) 174/82  01/10/15 (!) 152/84

## 2016-08-25 NOTE — Assessment & Plan Note (Signed)
stable overall by history and exam, recent data reviewed with pt, and pt to continue medical treatment as before,  to f/u any worsening symptoms or concerns, for f/u labs today 

## 2016-08-26 ENCOUNTER — Encounter: Payer: Self-pay | Admitting: Internal Medicine

## 2016-08-26 LAB — URINALYSIS, ROUTINE W REFLEX MICROSCOPIC
BILIRUBIN URINE: NEGATIVE
HGB URINE DIPSTICK: NEGATIVE
KETONES UR: NEGATIVE
LEUKOCYTES UA: NEGATIVE
NITRITE: NEGATIVE
PH: 6 (ref 5.0–8.0)
RBC / HPF: NONE SEEN (ref 0–?)
Specific Gravity, Urine: 1.01 (ref 1.000–1.030)
Total Protein, Urine: NEGATIVE
UROBILINOGEN UA: 0.2 (ref 0.0–1.0)
Urine Glucose: NEGATIVE
WBC, UA: NONE SEEN (ref 0–?)

## 2016-08-26 LAB — MICROALBUMIN / CREATININE URINE RATIO
Creatinine,U: 74.7 mg/dL
MICROALB UR: 4.8 mg/dL — AB (ref 0.0–1.9)
Microalb Creat Ratio: 6.4 mg/g (ref 0.0–30.0)

## 2016-12-07 DIAGNOSIS — N183 Chronic kidney disease, stage 3 (moderate): Secondary | ICD-10-CM | POA: Diagnosis not present

## 2016-12-07 DIAGNOSIS — N2581 Secondary hyperparathyroidism of renal origin: Secondary | ICD-10-CM | POA: Diagnosis not present

## 2016-12-15 DIAGNOSIS — Z6831 Body mass index (BMI) 31.0-31.9, adult: Secondary | ICD-10-CM | POA: Diagnosis not present

## 2016-12-15 DIAGNOSIS — N183 Chronic kidney disease, stage 3 (moderate): Secondary | ICD-10-CM | POA: Diagnosis not present

## 2016-12-15 DIAGNOSIS — I129 Hypertensive chronic kidney disease with stage 1 through stage 4 chronic kidney disease, or unspecified chronic kidney disease: Secondary | ICD-10-CM | POA: Diagnosis not present

## 2016-12-15 DIAGNOSIS — D631 Anemia in chronic kidney disease: Secondary | ICD-10-CM | POA: Diagnosis not present

## 2016-12-15 DIAGNOSIS — N2581 Secondary hyperparathyroidism of renal origin: Secondary | ICD-10-CM | POA: Diagnosis not present

## 2016-12-22 DIAGNOSIS — C61 Malignant neoplasm of prostate: Secondary | ICD-10-CM | POA: Diagnosis not present

## 2016-12-22 DIAGNOSIS — C7951 Secondary malignant neoplasm of bone: Secondary | ICD-10-CM | POA: Diagnosis not present

## 2016-12-22 DIAGNOSIS — M810 Age-related osteoporosis without current pathological fracture: Secondary | ICD-10-CM | POA: Diagnosis not present

## 2017-01-21 DIAGNOSIS — C7951 Secondary malignant neoplasm of bone: Secondary | ICD-10-CM | POA: Diagnosis not present

## 2017-01-21 DIAGNOSIS — Z5111 Encounter for antineoplastic chemotherapy: Secondary | ICD-10-CM | POA: Diagnosis not present

## 2017-02-23 ENCOUNTER — Ambulatory Visit: Payer: Commercial Managed Care - HMO | Admitting: Internal Medicine

## 2017-03-11 ENCOUNTER — Encounter: Payer: Self-pay | Admitting: Internal Medicine

## 2017-03-11 ENCOUNTER — Ambulatory Visit (INDEPENDENT_AMBULATORY_CARE_PROVIDER_SITE_OTHER): Payer: Medicare HMO | Admitting: Internal Medicine

## 2017-03-11 ENCOUNTER — Other Ambulatory Visit (INDEPENDENT_AMBULATORY_CARE_PROVIDER_SITE_OTHER): Payer: Medicare HMO

## 2017-03-11 VITALS — BP 148/84 | HR 60 | Ht 74.0 in | Wt 218.0 lb

## 2017-03-11 DIAGNOSIS — Z0000 Encounter for general adult medical examination without abnormal findings: Secondary | ICD-10-CM | POA: Diagnosis not present

## 2017-03-11 DIAGNOSIS — E119 Type 2 diabetes mellitus without complications: Secondary | ICD-10-CM

## 2017-03-11 LAB — HEPATIC FUNCTION PANEL
ALBUMIN: 4.2 g/dL (ref 3.5–5.2)
ALK PHOS: 56 U/L (ref 39–117)
ALT: 14 U/L (ref 0–53)
AST: 28 U/L (ref 0–37)
Bilirubin, Direct: 0 mg/dL (ref 0.0–0.3)
TOTAL PROTEIN: 7.8 g/dL (ref 6.0–8.3)
Total Bilirubin: 0.4 mg/dL (ref 0.2–1.2)

## 2017-03-11 LAB — LIPID PANEL
CHOLESTEROL: 160 mg/dL (ref 0–200)
HDL: 49.7 mg/dL (ref >39.00)
LDL CALC: 83 mg/dL (ref 0–99)
NonHDL: 110.63
Total CHOL/HDL Ratio: 3
Triglycerides: 140 mg/dL (ref 0.0–149.0)
VLDL: 28 mg/dL (ref 0.0–40.0)

## 2017-03-11 LAB — CBC WITH DIFFERENTIAL/PLATELET
BASOS ABS: 0 10*3/uL (ref 0.0–0.1)
Basophils Relative: 0.5 % (ref 0.0–3.0)
EOS PCT: 2.5 % (ref 0.0–5.0)
Eosinophils Absolute: 0.2 10*3/uL (ref 0.0–0.7)
HCT: 39.9 % (ref 39.0–52.0)
HEMOGLOBIN: 13.5 g/dL (ref 13.0–17.0)
LYMPHS ABS: 3.1 10*3/uL (ref 0.7–4.0)
LYMPHS PCT: 40.8 % (ref 12.0–46.0)
MCHC: 33.8 g/dL (ref 30.0–36.0)
MCV: 91.3 fl (ref 78.0–100.0)
MONOS PCT: 10 % (ref 3.0–12.0)
Monocytes Absolute: 0.7 10*3/uL (ref 0.1–1.0)
NEUTROS PCT: 46.2 % (ref 43.0–77.0)
Neutro Abs: 3.5 10*3/uL (ref 1.4–7.7)
Platelets: 204 10*3/uL (ref 150.0–400.0)
RBC: 4.37 Mil/uL (ref 4.22–5.81)
RDW: 14.1 % (ref 11.5–15.5)
WBC: 7.5 10*3/uL (ref 4.0–10.5)

## 2017-03-11 LAB — TSH: TSH: 1.2 u[IU]/mL (ref 0.35–4.50)

## 2017-03-11 LAB — HEMOGLOBIN A1C: HEMOGLOBIN A1C: 7.2 % — AB (ref 4.6–6.5)

## 2017-03-11 LAB — BASIC METABOLIC PANEL
BUN: 19 mg/dL (ref 6–23)
CALCIUM: 9.8 mg/dL (ref 8.4–10.5)
CO2: 28 meq/L (ref 19–32)
CREATININE: 1.65 mg/dL — AB (ref 0.40–1.50)
Chloride: 105 mEq/L (ref 96–112)
GFR: 52.08 mL/min — AB (ref >60.00)
GLUCOSE: 159 mg/dL — AB (ref 70–99)
Potassium: 3.5 mEq/L (ref 3.5–5.1)
SODIUM: 141 meq/L (ref 135–145)

## 2017-03-11 MED ORDER — BLOOD GLUCOSE TEST VI STRP: ORAL_STRIP | 3 refills | Status: DC

## 2017-03-11 NOTE — Patient Instructions (Signed)

## 2017-03-11 NOTE — Progress Notes (Signed)
Subjective:    Patient ID: Eric Wade, male    DOB: 01-Nov-1940, 76 y.o.   MRN: [id number]  HPI  Here for wellness and f/u;  Overall doing ok;  Pt denies Chest pain, worsening SOB, DOE, wheezing, orthopnea, PND, worsening LE edema, palpitations, dizziness or syncope.  Pt denies neurological change such as new headache, facial or extremity weakness.  Pt denies polydipsia, polyuria, or low sugar symptoms. Pt states overall good compliance with treatment and medications, good tolerability, and has been trying to follow appropriate diet.  Pt denies worsening depressive symptoms, suicidal ideation or panic. No fever, night sweats, wt loss, loss of appetite, or other constitutional symptoms.  Pt states good ability with ADL's, has low fall risk, home safety reviewed and adequate, no other significant changes in hearing or vision, and only occasionally active with exercise. No other hx changes  States BP at home < 140/90 Past Medical History:  Diagnosis Date  . Allergic rhinitis 08/25/2016  . DIABETES MELLITUS, TYPE II 05/08/2007  . ERECTILE DYSFUNCTION 05/08/2007  . GOUT 07/01/2007  . HYPERLIPIDEMIA 05/08/2007  . HYPERTENSION 05/08/2007  . OBESITY 05/08/2007  . Preventative health care 12/28/2010  . Prostate cancer ([place]) 07/02/2008   Qualifier: Diagnosis of  By: Jenny Reichmann MD, Hunt Oris   . PSA, INCREASED 07/02/2008  . RENAL INSUFFICIENCY 07/01/2007   No past surgical history on file.  reports that he has quit smoking. He has never used smokeless tobacco. He reports that he does not drink alcohol or use drugs. family history includes Hypertension in his other. Allergies  Allergen Reactions  . Nsaids     REACTION: renal insufficiency   Current Outpatient Prescriptions on File Prior to Visit  Medication Sig Dispense Refill  . amLODipine (NORVASC) 10 MG tablet     . aspirin 81 MG EC tablet Take 81 mg by mouth daily.      . blood glucose meter kit and supplies KIT Pt receives from [facility] 1 each 0  .  diltiazem (DILT-XR) 240 MG 24 hr capsule Take 1 capsule (240 mg total) by mouth daily. 90 capsule 3  . furosemide (LASIX) 40 MG tablet Take 1 tablet (40 mg total) by mouth daily. 90 tablet 3  . Insulin Glargine (LANTUS) 100 UNIT/ML Solostar Pen Inject 40 Units into the skin daily at 10 pm. 45 mL 3  . Lancet Devices (AUTO-LANCET) MISC Pt received from [facility] 1 each 0  . lisinopril (PRINIVIL,ZESTRIL) 20 MG tablet Take 1 tablet (20 mg total) by mouth daily. 90 tablet 3  . lovastatin (MEVACOR) 40 MG tablet Take 1 tablet (40 mg total) by mouth daily. 90 tablet 3  . pioglitazone (ACTOS) 30 MG tablet Take 1 tablet (30 mg total) by mouth daily. 90 tablet 3   No current facility-administered medications on file prior to visit.    Review of Systems Constitutional: Negative for other unusual diaphoresis, sweats, appetite or weight changes HENT: Negative for other worsening hearing loss, ear pain, facial swelling, mouth sores or neck stiffness.   Eyes: Negative for other worsening pain, redness or other visual disturbance.  Respiratory: Negative for other stridor or swelling Cardiovascular: Negative for other palpitations or other chest pain  Gastrointestinal: Negative for worsening diarrhea or loose stools, blood in stool, distention or other pain Genitourinary: Negative for hematuria, flank pain or other change in urine volume.  Musculoskeletal: Negative for myalgias or other joint swelling.  Skin: Negative for other color change, or other wound or worsening drainage.  Neurological:  Negative for other syncope or numbness. Hematological: Negative for other adenopathy or swelling Psychiatric/Behavioral: Negative for hallucinations, other worsening agitation, SI, self-injury, or new decreased concentration All other system neg per pt    Objective:   Physical Exam BP (!) 148/84   Pulse 60   Ht '6\' 2"'  (1.88 m)   Wt 218 lb (98.9 kg)   SpO2 98%   BMI 27.99 kg/m  VS noted,  Constitutional: Pt is  oriented to person, place, and time. Appears well-developed and well-nourished, in no significant distress and comfortable Head: Normocephalic and atraumatic  Eyes: Conjunctivae and EOM are normal. Pupils are equal, round, and reactive to light Right Ear: External ear normal without discharge Left Ear: External ear normal without discharge Nose: Nose without discharge or deformity Mouth/Throat: Oropharynx is without other ulcerations and moist  Neck: Normal range of motion. Neck supple. No JVD present. No tracheal deviation present or significant neck LA or mass Cardiovascular: Normal rate, regular rhythm, normal heart sounds and intact distal pulses.   Pulmonary/Chest: WOB normal and breath sounds without rales or wheezing  Abdominal: Soft. Bowel sounds are normal. NT. No HSM  Musculoskeletal: Normal range of motion. Exhibits no edema Lymphadenopathy: Has no other cervical adenopathy.  Neurological: Pt is alert and oriented to person, place, and time. Pt has normal reflexes. No cranial nerve deficit. Motor grossly intact, Gait intact Skin: Skin is warm and dry. No rash noted or new ulcerations Psychiatric:  Has normal mood and affect. Behavior is normal without agitation No other exam findings BP Readings from Last 3 Encounters:  03/11/17 (!) 148/84  08/25/16 140/80  07/17/15 (!) 174/82       Assessment & Plan:

## 2017-03-12 ENCOUNTER — Encounter: Payer: Self-pay | Admitting: Internal Medicine

## 2017-03-12 LAB — URINALYSIS, ROUTINE W REFLEX MICROSCOPIC
BILIRUBIN URINE: NEGATIVE
Hgb urine dipstick: NEGATIVE
Ketones, ur: NEGATIVE
Leukocytes, UA: NEGATIVE
NITRITE: POSITIVE — AB
PH: 6 (ref 5.0–8.0)
RBC / HPF: NONE SEEN (ref 0–?)
SPECIFIC GRAVITY, URINE: 1.02 (ref 1.000–1.030)
Total Protein, Urine: NEGATIVE
URINE GLUCOSE: NEGATIVE
UROBILINOGEN UA: 0.2 (ref 0.0–1.0)

## 2017-03-12 LAB — MICROALBUMIN / CREATININE URINE RATIO
CREATININE, U: 170.2 mg/dL
MICROALB/CREAT RATIO: 1.5 mg/g (ref 0.0–30.0)
Microalb, Ur: 2.6 mg/dL — ABNORMAL HIGH (ref 0.0–1.9)

## 2017-03-13 NOTE — Assessment & Plan Note (Signed)

## 2017-03-13 NOTE — Assessment & Plan Note (Signed)
stable overall by history and exam, recent data reviewed with pt, and pt to continue medical treatment as before,  to f/u any worsening symptoms or concerns Lab Results  Component Value Date   HGBA1C 7.2 (H) 03/11/2017

## 2017-03-15 ENCOUNTER — Other Ambulatory Visit: Payer: Self-pay | Admitting: Internal Medicine

## 2017-04-19 ENCOUNTER — Other Ambulatory Visit: Payer: Self-pay | Admitting: Internal Medicine

## 2017-09-07 DIAGNOSIS — N183 Chronic kidney disease, stage 3 (moderate): Secondary | ICD-10-CM | POA: Diagnosis not present

## 2017-09-07 DIAGNOSIS — D631 Anemia in chronic kidney disease: Secondary | ICD-10-CM | POA: Diagnosis not present

## 2017-09-07 DIAGNOSIS — N2581 Secondary hyperparathyroidism of renal origin: Secondary | ICD-10-CM | POA: Diagnosis not present

## 2017-09-07 DIAGNOSIS — Z23 Encounter for immunization: Secondary | ICD-10-CM | POA: Diagnosis not present

## 2017-09-07 DIAGNOSIS — I129 Hypertensive chronic kidney disease with stage 1 through stage 4 chronic kidney disease, or unspecified chronic kidney disease: Secondary | ICD-10-CM | POA: Diagnosis not present

## 2017-09-07 DIAGNOSIS — N189 Chronic kidney disease, unspecified: Secondary | ICD-10-CM | POA: Diagnosis not present

## 2017-09-14 ENCOUNTER — Encounter: Payer: Self-pay | Admitting: Internal Medicine

## 2017-09-14 ENCOUNTER — Ambulatory Visit (INDEPENDENT_AMBULATORY_CARE_PROVIDER_SITE_OTHER): Payer: Medicare HMO | Admitting: Internal Medicine

## 2017-09-14 ENCOUNTER — Other Ambulatory Visit (INDEPENDENT_AMBULATORY_CARE_PROVIDER_SITE_OTHER): Payer: Medicare HMO

## 2017-09-14 VITALS — BP 128/80 | HR 75 | Temp 98.5°F | Ht 74.0 in | Wt 216.0 lb

## 2017-09-14 DIAGNOSIS — E785 Hyperlipidemia, unspecified: Secondary | ICD-10-CM

## 2017-09-14 DIAGNOSIS — I10 Essential (primary) hypertension: Secondary | ICD-10-CM

## 2017-09-14 DIAGNOSIS — N183 Chronic kidney disease, stage 3 unspecified: Secondary | ICD-10-CM

## 2017-09-14 DIAGNOSIS — E119 Type 2 diabetes mellitus without complications: Secondary | ICD-10-CM | POA: Diagnosis not present

## 2017-09-14 LAB — HEPATIC FUNCTION PANEL
ALBUMIN: 4.1 g/dL (ref 3.5–5.2)
ALT: 13 U/L (ref 0–53)
AST: 25 U/L (ref 0–37)
Alkaline Phosphatase: 76 U/L (ref 39–117)
Bilirubin, Direct: 0.1 mg/dL (ref 0.0–0.3)
Total Bilirubin: 0.4 mg/dL (ref 0.2–1.2)
Total Protein: 7.6 g/dL (ref 6.0–8.3)

## 2017-09-14 LAB — BASIC METABOLIC PANEL
BUN: 19 mg/dL (ref 6–23)
CALCIUM: 9.5 mg/dL (ref 8.4–10.5)
CO2: 28 meq/L (ref 19–32)
Chloride: 99 mEq/L (ref 96–112)
Creatinine, Ser: 1.48 mg/dL (ref 0.40–1.50)
GFR: 58.97 mL/min — ABNORMAL LOW (ref >60.00)
GLUCOSE: 234 mg/dL — AB (ref 70–99)
Potassium: 3.6 mEq/L (ref 3.5–5.1)
Sodium: 137 mEq/L (ref 135–145)

## 2017-09-14 LAB — LIPID PANEL
CHOL/HDL RATIO: 3
Cholesterol: 149 mg/dL (ref 0–200)
HDL: 55.7 mg/dL (ref >39.00)
LDL Cholesterol: 61 mg/dL (ref 0–99)
NONHDL: 93.61
TRIGLYCERIDES: 164 mg/dL — AB (ref 0.0–149.0)
VLDL: 32.8 mg/dL (ref 0.0–40.0)

## 2017-09-14 LAB — HEMOGLOBIN A1C: HEMOGLOBIN A1C: 7 % — AB (ref 4.6–6.5)

## 2017-09-14 NOTE — Assessment & Plan Note (Addendum)
Urged compliance with med, o/w stable overall by history and exam, recent data reviewed with pt, and pt to continue medical treatment as before,  to f/u any worsening symptoms or concerns Lab Results  Component Value Date   HGBA1C 7.2 (H) 03/11/2017  for f/u lab

## 2017-09-14 NOTE — Progress Notes (Signed)
Subjective:    Patient ID: Eric Wade, male    DOB: 05-May-1941, 77 y.o.   MRN: [id number]  HPI  Here to f/u; overall doing ok,  Pt denies chest pain, increasing sob or doe, wheezing, orthopnea, PND, increased LE swelling, palpitations, dizziness or syncope.  Pt denies new neurological symptoms such as new headache, or facial or extremity weakness or numbness.  Pt denies polydipsia, polyuria, or low sugar episode.  Pt states overall good compliance with meds, mostly trying to follow appropriate diet, with wt overall stable.  Admits to occas forgetting his insulin shot.  Has appt with optho next wk.   Wt Readings from Last 3 Encounters:  09/14/17 216 lb (98 kg)  03/11/17 218 lb (98.9 kg)  08/25/16 227 lb (103 kg)   BP Readings from Last 3 Encounters:  09/14/17 128/80  03/11/17 (!) 148/84  08/25/16 140/80  Mentions started on OAB med per renal Dr Posey Pronto last wk cannot recall the name, but may be Lisbeth Ply, has some decreased urinary freq Past Medical History:  Diagnosis Date  . Allergic rhinitis 08/25/2016  . DIABETES MELLITUS, TYPE II 05/08/2007  . ERECTILE DYSFUNCTION 05/08/2007  . GOUT 07/01/2007  . HYPERLIPIDEMIA 05/08/2007  . HYPERTENSION 05/08/2007  . OBESITY 05/08/2007  . Preventative health care 12/28/2010  . Prostate cancer ([place]) 07/02/2008   Qualifier: Diagnosis of  By: Jenny Reichmann MD, Hunt Oris   . PSA, INCREASED 07/02/2008  . RENAL INSUFFICIENCY 07/01/2007   No past surgical history on file.  reports that he has quit smoking. he has never used smokeless tobacco. He reports that he does not drink alcohol or use drugs. family history includes Hypertension in his other. Allergies  Allergen Reactions  . Nsaids     REACTION: renal insufficiency   Current Outpatient Medications on File Prior to Visit  Medication Sig Dispense Refill  . amLODipine (NORVASC) 10 MG tablet     . aspirin 81 MG EC tablet Take 81 mg by mouth daily.      . blood glucose meter kit and supplies KIT Pt receives from  [facility] 1 each 0  . DILT-XR 240 MG 24 hr capsule TAKE 1 CAPSULE (240 MG TOTAL) BY MOUTH DAILY. 90 capsule 3  . furosemide (LASIX) 40 MG tablet TAKE 1 TABLET (40 MG TOTAL) BY MOUTH DAILY. 90 tablet 3  . Glucose Blood (BLOOD GLUCOSE TEST STRIPS) STRP Pt receives from [facility], use as directed twice per day 200 each 3  . Insulin Glargine (LANTUS) 100 UNIT/ML Solostar Pen Inject 40 Units into the skin daily at 10 pm. 45 mL 3  . Lancet Devices (AUTO-LANCET) MISC Pt received from [facility] 1 each 0  . lisinopril (PRINIVIL,ZESTRIL) 20 MG tablet TAKE 1 TABLET EVERY DAY 90 tablet 3  . pioglitazone (ACTOS) 30 MG tablet TAKE 1 TABLET (30 MG TOTAL) BY MOUTH DAILY. 90 tablet 3   No current facility-administered medications on file prior to visit.    Review of Systems  Constitutional: Negative for other unusual diaphoresis or sweats HENT: Negative for ear discharge or swelling Eyes: Negative for other worsening visual disturbances Respiratory: Negative for stridor or other swelling  Gastrointestinal: Negative for worsening distension or other blood Genitourinary: Negative for retention or other urinary change Musculoskeletal: Negative for other MSK pain or swelling Skin: Negative for color change or other new lesions Neurological: Negative for worsening tremors and other numbness  Psychiatric/Behavioral: Negative for worsening agitation or other fatigue All other system neg per pt  Objective:   Physical Exam BP 128/80   Pulse 75   Temp 98.5 F (36.9 C) (Oral)   Ht '6\' 2"'  (1.88 m)   Wt 216 lb (98 kg)   SpO2 99%   BMI 27.73 kg/m  VS noted,  Constitutional: Pt appears in NAD HENT: Head: NCAT.  Right Ear: External ear normal.  Left Ear: External ear normal.  Eyes: . Pupils are equal, round, and reactive to light. Conjunctivae and EOM are normal Nose: without d/c or deformity Neck: Neck supple. Gross normal ROM Cardiovascular: Normal rate and regular rhythm.   Pulmonary/Chest: Effort normal  and breath sounds without rales or wheezing.  Abd:  Soft, NT, ND, + BS, no organomegaly Neurological: Pt is alert. At baseline orientation, motor grossly intact Skin: Skin is warm. No rashes, other new lesions, no LE edema Psychiatric: Pt behavior is normal without agitation  No other exam findings Lab Results  Component Value Date   WBC 7.5 03/11/2017   HGB 13.5 03/11/2017   HCT 39.9 03/11/2017   PLT 204.0 03/11/2017   GLUCOSE 159 (H) 03/11/2017   CHOL 160 03/11/2017   TRIG 140.0 03/11/2017   HDL 49.70 03/11/2017   LDLDIRECT 72.5 12/30/2010   LDLCALC 83 03/11/2017   ALT 14 03/11/2017   AST 28 03/11/2017   NA 141 03/11/2017   K 3.5 03/11/2017   CL 105 03/11/2017   CREATININE 1.65 (H) 03/11/2017   BUN 19 03/11/2017   CO2 28 03/11/2017   TSH 1.20 03/11/2017   PSA 0.30 08/25/2016   HGBA1C 7.2 (H) 03/11/2017   MICROALBUR 2.6 (H) 03/11/2017   \    Assessment & Plan:

## 2017-09-14 NOTE — Assessment & Plan Note (Signed)
stable overall by history and exam, recent data reviewed with pt, and pt to continue medical treatment as before,  to f/u any worsening symptoms or concerns BP Readings from Last 3 Encounters:  09/14/17 128/80  03/11/17 (!) 148/84  08/25/16 140/80

## 2017-09-14 NOTE — Assessment & Plan Note (Signed)
Mild uncontrolled, to change lovastatin to crestor 20 qd, f/u labs and lower chol diet

## 2017-09-14 NOTE — Patient Instructions (Signed)
Ok to change the lovastatin to Crestor 20 mg per day  Please continue all other medications as before, and refills have been done if requested.  Please have the pharmacy call with any other refills you may need.  Please continue your efforts at being more active, low cholesterol diet, and weight control.  Please keep your appointments with your specialists as you may have planned  Please go to the LAB in the Basement (turn left off the elevator) for the tests to be done today  You will be contacted by phone if any changes need to be made immediately.  Otherwise, you will receive a letter about your results with an explanation, but please check with MyChart first.  Please remember to sign up for MyChart if you have not done so, as this will be important to you in the future with finding out test results, communicating by private email, and scheduling acute appointments online when needed.  Please return in 6 months, or sooner if needed, with Lab testing done 3-5 days before

## 2017-09-14 NOTE — Assessment & Plan Note (Signed)
Recently stable, for f/u lab, and f/u renal as planned

## 2018-01-24 ENCOUNTER — Other Ambulatory Visit: Payer: Self-pay | Admitting: Internal Medicine

## 2018-03-08 ENCOUNTER — Ambulatory Visit: Payer: Medicare HMO | Admitting: Internal Medicine

## 2018-03-15 ENCOUNTER — Ambulatory Visit: Payer: Medicare HMO | Admitting: Internal Medicine

## 2018-05-24 DIAGNOSIS — N189 Chronic kidney disease, unspecified: Secondary | ICD-10-CM | POA: Diagnosis not present

## 2018-05-24 DIAGNOSIS — D631 Anemia in chronic kidney disease: Secondary | ICD-10-CM | POA: Diagnosis not present

## 2018-05-24 DIAGNOSIS — N2581 Secondary hyperparathyroidism of renal origin: Secondary | ICD-10-CM | POA: Diagnosis not present

## 2018-05-24 DIAGNOSIS — I129 Hypertensive chronic kidney disease with stage 1 through stage 4 chronic kidney disease, or unspecified chronic kidney disease: Secondary | ICD-10-CM | POA: Diagnosis not present

## 2018-05-24 DIAGNOSIS — N183 Chronic kidney disease, stage 3 (moderate): Secondary | ICD-10-CM | POA: Diagnosis not present

## 2018-10-27 DIAGNOSIS — H25812 Combined forms of age-related cataract, left eye: Secondary | ICD-10-CM | POA: Diagnosis not present

## 2018-10-27 DIAGNOSIS — H26491 Other secondary cataract, right eye: Secondary | ICD-10-CM | POA: Diagnosis not present

## 2018-11-03 DIAGNOSIS — E118 Type 2 diabetes mellitus with unspecified complications: Secondary | ICD-10-CM | POA: Diagnosis not present

## 2018-11-03 DIAGNOSIS — M109 Gout, unspecified: Secondary | ICD-10-CM | POA: Diagnosis not present

## 2018-11-03 DIAGNOSIS — E1122 Type 2 diabetes mellitus with diabetic chronic kidney disease: Secondary | ICD-10-CM | POA: Diagnosis not present

## 2018-11-03 DIAGNOSIS — H269 Unspecified cataract: Secondary | ICD-10-CM | POA: Diagnosis not present

## 2018-11-03 DIAGNOSIS — I1310 Hypertensive heart and chronic kidney disease without heart failure, with stage 1 through stage 4 chronic kidney disease, or unspecified chronic kidney disease: Secondary | ICD-10-CM | POA: Diagnosis not present

## 2018-11-03 DIAGNOSIS — E785 Hyperlipidemia, unspecified: Secondary | ICD-10-CM | POA: Diagnosis not present

## 2018-11-03 DIAGNOSIS — Z008 Encounter for other general examination: Secondary | ICD-10-CM | POA: Diagnosis not present

## 2018-11-03 DIAGNOSIS — Z794 Long term (current) use of insulin: Secondary | ICD-10-CM | POA: Diagnosis not present

## 2018-11-03 DIAGNOSIS — N183 Chronic kidney disease, stage 3 (moderate): Secondary | ICD-10-CM | POA: Diagnosis not present

## 2018-11-10 DIAGNOSIS — H401113 Primary open-angle glaucoma, right eye, severe stage: Secondary | ICD-10-CM | POA: Diagnosis not present

## 2018-11-10 DIAGNOSIS — H3582 Retinal ischemia: Secondary | ICD-10-CM | POA: Diagnosis not present

## 2018-11-10 DIAGNOSIS — H47232 Glaucomatous optic atrophy, left eye: Secondary | ICD-10-CM | POA: Diagnosis not present

## 2018-11-10 DIAGNOSIS — Z961 Presence of intraocular lens: Secondary | ICD-10-CM | POA: Diagnosis not present

## 2018-11-10 DIAGNOSIS — H25812 Combined forms of age-related cataract, left eye: Secondary | ICD-10-CM | POA: Diagnosis not present

## 2018-11-10 DIAGNOSIS — H35033 Hypertensive retinopathy, bilateral: Secondary | ICD-10-CM | POA: Diagnosis not present

## 2018-11-10 DIAGNOSIS — H4321 Crystalline deposits in vitreous body, right eye: Secondary | ICD-10-CM | POA: Diagnosis not present

## 2018-12-28 ENCOUNTER — Inpatient Hospital Stay (HOSPITAL_COMMUNITY)
Admission: EM | Admit: 2018-12-28 | Discharge: 2019-01-03 | DRG: 091 | Disposition: A | Discharge status: Home Health | Payer: Medicare HMO | Attending: Internal Medicine | Admitting: Internal Medicine

## 2018-12-28 ENCOUNTER — Inpatient Hospital Stay (HOSPITAL_COMMUNITY): Payer: Medicare HMO

## 2018-12-28 ENCOUNTER — Encounter (HOSPITAL_COMMUNITY): Payer: Self-pay | Admitting: Emergency Medicine

## 2018-12-28 ENCOUNTER — Emergency Department (HOSPITAL_COMMUNITY): Payer: Medicare HMO

## 2018-12-28 DIAGNOSIS — N179 Acute kidney failure, unspecified: Secondary | ICD-10-CM | POA: Diagnosis present

## 2018-12-28 DIAGNOSIS — J9690 Respiratory failure, unspecified, unspecified whether with hypoxia or hypercapnia: Secondary | ICD-10-CM

## 2018-12-28 DIAGNOSIS — E119 Type 2 diabetes mellitus without complications: Secondary | ICD-10-CM

## 2018-12-28 DIAGNOSIS — F05 Delirium due to known physiological condition: Secondary | ICD-10-CM | POA: Diagnosis not present

## 2018-12-28 DIAGNOSIS — I129 Hypertensive chronic kidney disease with stage 1 through stage 4 chronic kidney disease, or unspecified chronic kidney disease: Secondary | ICD-10-CM | POA: Diagnosis present

## 2018-12-28 DIAGNOSIS — E871 Hypo-osmolality and hyponatremia: Secondary | ICD-10-CM | POA: Diagnosis present

## 2018-12-28 DIAGNOSIS — E876 Hypokalemia: Secondary | ICD-10-CM | POA: Diagnosis not present

## 2018-12-28 DIAGNOSIS — H518 Other specified disorders of binocular movement: Secondary | ICD-10-CM | POA: Diagnosis present

## 2018-12-28 DIAGNOSIS — Z794 Long term (current) use of insulin: Secondary | ICD-10-CM

## 2018-12-28 DIAGNOSIS — IMO0001 Reserved for inherently not codable concepts without codable children: Secondary | ICD-10-CM

## 2018-12-28 DIAGNOSIS — I161 Hypertensive emergency: Secondary | ICD-10-CM | POA: Diagnosis present

## 2018-12-28 DIAGNOSIS — Z79899 Other long term (current) drug therapy: Secondary | ICD-10-CM

## 2018-12-28 DIAGNOSIS — E1065 Type 1 diabetes mellitus with hyperglycemia: Secondary | ICD-10-CM | POA: Diagnosis not present

## 2018-12-28 DIAGNOSIS — R531 Weakness: Secondary | ICD-10-CM | POA: Diagnosis present

## 2018-12-28 DIAGNOSIS — R29810 Facial weakness: Secondary | ICD-10-CM | POA: Diagnosis present

## 2018-12-28 DIAGNOSIS — G9340 Encephalopathy, unspecified: Secondary | ICD-10-CM

## 2018-12-28 DIAGNOSIS — Y92230 Patient room in hospital as the place of occurrence of the external cause: Secondary | ICD-10-CM | POA: Diagnosis not present

## 2018-12-28 DIAGNOSIS — E1122 Type 2 diabetes mellitus with diabetic chronic kidney disease: Secondary | ICD-10-CM | POA: Diagnosis present

## 2018-12-28 DIAGNOSIS — E785 Hyperlipidemia, unspecified: Secondary | ICD-10-CM | POA: Diagnosis present

## 2018-12-28 DIAGNOSIS — R401 Stupor: Secondary | ICD-10-CM | POA: Diagnosis not present

## 2018-12-28 DIAGNOSIS — E1029 Type 1 diabetes mellitus with other diabetic kidney complication: Secondary | ICD-10-CM | POA: Diagnosis not present

## 2018-12-28 DIAGNOSIS — T404X5A Adverse effect of other synthetic narcotics, initial encounter: Secondary | ICD-10-CM | POA: Diagnosis not present

## 2018-12-28 DIAGNOSIS — K760 Fatty (change of) liver, not elsewhere classified: Secondary | ICD-10-CM | POA: Diagnosis present

## 2018-12-28 DIAGNOSIS — G8194 Hemiplegia, unspecified affecting left nondominant side: Secondary | ICD-10-CM | POA: Diagnosis present

## 2018-12-28 DIAGNOSIS — E7220 Disorder of urea cycle metabolism, unspecified: Secondary | ICD-10-CM | POA: Diagnosis not present

## 2018-12-28 DIAGNOSIS — R739 Hyperglycemia, unspecified: Secondary | ICD-10-CM | POA: Diagnosis not present

## 2018-12-28 DIAGNOSIS — E1165 Type 2 diabetes mellitus with hyperglycemia: Secondary | ICD-10-CM | POA: Diagnosis present

## 2018-12-28 DIAGNOSIS — I672 Cerebral atherosclerosis: Secondary | ICD-10-CM | POA: Diagnosis present

## 2018-12-28 DIAGNOSIS — G92 Toxic encephalopathy: Secondary | ICD-10-CM | POA: Diagnosis present

## 2018-12-28 DIAGNOSIS — Z886 Allergy status to analgesic agent status: Secondary | ICD-10-CM

## 2018-12-28 DIAGNOSIS — I639 Cerebral infarction, unspecified: Secondary | ICD-10-CM | POA: Diagnosis not present

## 2018-12-28 DIAGNOSIS — J9600 Acute respiratory failure, unspecified whether with hypoxia or hypercapnia: Secondary | ICD-10-CM | POA: Diagnosis present

## 2018-12-28 DIAGNOSIS — I952 Hypotension due to drugs: Secondary | ICD-10-CM | POA: Diagnosis not present

## 2018-12-28 DIAGNOSIS — N183 Chronic kidney disease, stage 3 (moderate): Secondary | ICD-10-CM | POA: Diagnosis present

## 2018-12-28 DIAGNOSIS — R29730 NIHSS score 30: Secondary | ICD-10-CM | POA: Diagnosis present

## 2018-12-28 DIAGNOSIS — T50916A Underdosing of multiple unspecified drugs, medicaments and biological substances, initial encounter: Secondary | ICD-10-CM | POA: Diagnosis present

## 2018-12-28 DIAGNOSIS — R1312 Dysphagia, oropharyngeal phase: Secondary | ICD-10-CM | POA: Diagnosis not present

## 2018-12-28 DIAGNOSIS — G9341 Metabolic encephalopathy: Secondary | ICD-10-CM

## 2018-12-28 DIAGNOSIS — I10 Essential (primary) hypertension: Secondary | ICD-10-CM | POA: Diagnosis not present

## 2018-12-28 DIAGNOSIS — Z7982 Long term (current) use of aspirin: Secondary | ICD-10-CM

## 2018-12-28 DIAGNOSIS — Z781 Physical restraint status: Secondary | ICD-10-CM

## 2018-12-28 DIAGNOSIS — Z91128 Patient's intentional underdosing of medication regimen for other reason: Secondary | ICD-10-CM

## 2018-12-28 DIAGNOSIS — Z8673 Personal history of transient ischemic attack (TIA), and cerebral infarction without residual deficits: Secondary | ICD-10-CM

## 2018-12-28 DIAGNOSIS — Z87891 Personal history of nicotine dependence: Secondary | ICD-10-CM

## 2018-12-28 DIAGNOSIS — Z8249 Family history of ischemic heart disease and other diseases of the circulatory system: Secondary | ICD-10-CM

## 2018-12-28 DIAGNOSIS — T41295A Adverse effect of other general anesthetics, initial encounter: Secondary | ICD-10-CM | POA: Diagnosis not present

## 2018-12-28 LAB — POCT I-STAT 7, (LYTES, BLD GAS, ICA,H+H)
Acid-Base Excess: 1 mmol/L (ref 0.0–2.0)
Bicarbonate: 27.6 mmol/L (ref 20.0–28.0)
Calcium, Ion: 1.17 mmol/L (ref 1.15–1.40)
HCT: 43 % (ref 39.0–52.0)
Hemoglobin: 14.6 g/dL (ref 13.0–17.0)
O2 Saturation: 100 %
Patient temperature: 99
Potassium: 3.8 mmol/L (ref 3.5–5.1)
Sodium: 131 mmol/L — ABNORMAL LOW (ref 135–145)
TCO2: 29 mmol/L (ref 22–32)
pCO2 arterial: 49.2 mmHg — ABNORMAL HIGH (ref 32.0–48.0)
pH, Arterial: 7.357 (ref 7.350–7.450)
pO2, Arterial: 302 mmHg — ABNORMAL HIGH (ref 83.0–108.0)

## 2018-12-28 LAB — DIFFERENTIAL
Abs Immature Granulocytes: 0.01 10*3/uL (ref 0.00–0.07)
Basophils Absolute: 0 10*3/uL (ref 0.0–0.1)
Basophils Relative: 1 %
Eosinophils Absolute: 0.1 10*3/uL (ref 0.0–0.5)
Eosinophils Relative: 1 %
Immature Granulocytes: 0 %
Lymphocytes Relative: 40 %
Lymphs Abs: 3.1 10*3/uL (ref 0.7–4.0)
Monocytes Absolute: 0.7 10*3/uL (ref 0.1–1.0)
Monocytes Relative: 9 %
Neutro Abs: 3.7 10*3/uL (ref 1.7–7.7)
Neutrophils Relative %: 49 %

## 2018-12-28 LAB — COMPREHENSIVE METABOLIC PANEL
ALT: 11 U/L (ref 0–44)
AST: 23 U/L (ref 15–41)
Albumin: 4 g/dL (ref 3.5–5.0)
Alkaline Phosphatase: 97 U/L (ref 38–126)
Anion gap: 13 (ref 5–15)
BUN: 23 mg/dL (ref 8–23)
CO2: 24 mmol/L (ref 22–32)
Calcium: 9.3 mg/dL (ref 8.9–10.3)
Chloride: 93 mmol/L — ABNORMAL LOW (ref 98–111)
Creatinine, Ser: 2.04 mg/dL — ABNORMAL HIGH (ref 0.61–1.24)
GFR calc Af Amer: 35 mL/min — ABNORMAL LOW (ref >60)
GFR calc non Af Amer: 30 mL/min — ABNORMAL LOW (ref >60)
Glucose, Bld: 706 mg/dL (ref 70–99)
Potassium: 4 mmol/L (ref 3.5–5.1)
Sodium: 130 mmol/L — ABNORMAL LOW (ref 135–145)
Total Bilirubin: 1.1 mg/dL (ref 0.3–1.2)
Total Protein: 7.4 g/dL (ref 6.5–8.1)

## 2018-12-28 LAB — CBC
HCT: 47.6 % (ref 39.0–52.0)
Hemoglobin: 15.7 g/dL (ref 13.0–17.0)
MCH: 30 pg (ref 26.0–34.0)
MCHC: 33 g/dL (ref 30.0–36.0)
MCV: 90.8 fL (ref 80.0–100.0)
Platelets: 167 10*3/uL (ref 150–400)
RBC: 5.24 MIL/uL (ref 4.22–5.81)
RDW: 12.7 % (ref 11.5–15.5)
WBC: 7.6 10*3/uL (ref 4.0–10.5)
nRBC: 0 % (ref 0.0–0.2)

## 2018-12-28 LAB — URINALYSIS, ROUTINE W REFLEX MICROSCOPIC
Bilirubin Urine: NEGATIVE
Glucose, UA: >=500 mg/dL — AB
Ketones, ur: NEGATIVE mg/dL
Leukocytes,Ua: NEGATIVE
Nitrite: NEGATIVE
Protein, ur: 30 mg/dL — AB
Specific Gravity, Urine: 1.031 — ABNORMAL HIGH (ref 1.005–1.030)
pH: 7 (ref 5.0–8.0)

## 2018-12-28 LAB — RAPID URINE DRUG SCREEN, HOSP PERFORMED
Amphetamines: NOT DETECTED
Barbiturates: NOT DETECTED
Benzodiazepines: NOT DETECTED
Cocaine: NOT DETECTED
Opiates: NOT DETECTED
Tetrahydrocannabinol: NOT DETECTED

## 2018-12-28 LAB — PROTIME-INR
INR: 0.9 (ref 0.8–1.2)
Prothrombin Time: 12.4 seconds (ref 11.4–15.2)

## 2018-12-28 LAB — I-STAT CREATININE, ED: Creatinine, Ser: 1.9 mg/dL — ABNORMAL HIGH (ref 0.61–1.24)

## 2018-12-28 LAB — ETHANOL: Alcohol, Ethyl (B): <10 mg/dL (ref <10)

## 2018-12-28 LAB — APTT: aPTT: 26 seconds (ref 24–36)

## 2018-12-28 LAB — CBG MONITORING, ED: Glucose-Capillary: >600 mg/dL (ref 70–99)

## 2018-12-28 MED ORDER — IOHEXOL 350 MG/ML SOLN
100.0000 mL | Freq: Once | INTRAVENOUS | Status: AC | PRN
  Administered 2018-12-28: 21:00:00 100 mL via INTRAVENOUS

## 2018-12-28 MED ORDER — STROKE: EARLY STAGES OF RECOVERY BOOK: Freq: Once | Status: DC

## 2018-12-28 MED ORDER — PROPOFOL 1000 MG/100ML IV EMUL
0.0000 ug/kg/min | INTRAVENOUS | Status: DC
  Administered 2018-12-28: 23:00:00 30 ug/kg/min via INTRAVENOUS
  Filled 2018-12-28: qty 100

## 2018-12-28 MED ORDER — INSULIN REGULAR(HUMAN) IN NACL 100-0.9 UT/100ML-% IV SOLN
INTRAVENOUS | Status: DC
  Administered 2018-12-28: 23:00:00 4.5 [IU]/h via INTRAVENOUS
  Administered 2018-12-29: 08:00:00 12.1 [IU]/h via INTRAVENOUS
  Filled 2018-12-28 (×3): qty 100

## 2018-12-28 MED ORDER — NICARDIPINE HCL IN NACL 20-0.86 MG/200ML-% IV SOLN
0.0000 mg/h | INTRAVENOUS | Status: DC | PRN
  Administered 2018-12-28: 23:00:00 5 mg/h via INTRAVENOUS
  Filled 2018-12-28 (×2): qty 200

## 2018-12-28 MED ORDER — DOCUSATE SODIUM 50 MG/5ML PO LIQD: 100.0000 mg | Freq: Two times a day (BID) | ORAL | Status: DC | PRN

## 2018-12-28 MED ORDER — SODIUM CHLORIDE 0.9 % IV SOLN
50.0000 mL | Freq: Once | INTRAVENOUS | Status: AC
  Administered 2018-12-28: 22:00:00 50 mL via INTRAVENOUS

## 2018-12-28 MED ORDER — CLEVIDIPINE BUTYRATE 0.5 MG/ML IV EMUL
INTRAVENOUS | Status: AC
  Filled 2018-12-28: qty 50

## 2018-12-28 MED ORDER — ALTEPLASE (STROKE) FULL DOSE INFUSION
0.9000 mg/kg | Freq: Once | INTRAVENOUS | Status: AC
  Administered 2018-12-28: 21:00:00 75.7 mg via INTRAVENOUS
  Filled 2018-12-28: qty 100

## 2018-12-28 MED ORDER — MIDAZOLAM HCL 2 MG/2ML IJ SOLN
1.0000 mg | INTRAMUSCULAR | Status: DC | PRN
  Administered 2018-12-30: 07:00:00 1 mg via INTRAVENOUS
  Filled 2018-12-28 (×2): qty 2

## 2018-12-28 MED ORDER — SODIUM CHLORIDE 0.9 % IV SOLN
50.0000 mL/h | INTRAVENOUS | Status: DC
  Administered 2018-12-28 – 2018-12-29 (×3): 50 mL/h via INTRAVENOUS

## 2018-12-28 MED ORDER — CHLORHEXIDINE GLUCONATE 0.12% ORAL RINSE (MEDLINE KIT)
15.0000 mL | Freq: Two times a day (BID) | OROMUCOSAL | Status: DC
  Administered 2018-12-28 – 2018-12-30 (×4): 15 mL via OROMUCOSAL

## 2018-12-28 MED ORDER — PANTOPRAZOLE SODIUM 40 MG IV SOLR
40.0000 mg | Freq: Every day | INTRAVENOUS | Status: DC
  Administered 2018-12-29 – 2019-01-01 (×4): 40 mg via INTRAVENOUS
  Filled 2018-12-28 (×6): qty 40

## 2018-12-28 MED ORDER — DEXTROSE-NACL 5-0.45 % IV SOLN
INTRAVENOUS | Status: DC
  Administered 2018-12-29: 08:00:00 via INTRAVENOUS

## 2018-12-28 MED ORDER — MIDAZOLAM HCL 2 MG/2ML IJ SOLN
1.0000 mg | INTRAMUSCULAR | Status: DC | PRN
  Administered 2018-12-29 – 2018-12-30 (×2): 1 mg via INTRAVENOUS
  Filled 2018-12-28 (×4): qty 2

## 2018-12-28 MED ORDER — ACETAMINOPHEN 160 MG/5ML PO SOLN: 650.0000 mg | ORAL | Status: DC | PRN

## 2018-12-28 MED ORDER — LORAZEPAM 2 MG/ML IJ SOLN
2.0000 mg | Freq: Once | INTRAMUSCULAR | Status: AC
  Administered 2018-12-28: 21:00:00 2 mg via INTRAVENOUS

## 2018-12-28 MED ORDER — ORAL CARE MOUTH RINSE
15.0000 mL | OROMUCOSAL | Status: DC
  Administered 2018-12-28 – 2018-12-30 (×15): 15 mL via OROMUCOSAL

## 2018-12-28 MED ORDER — ETOMIDATE 2 MG/ML IV SOLN
INTRAVENOUS | Status: AC | PRN
  Administered 2018-12-28: 20 mg via INTRAVENOUS

## 2018-12-28 MED ORDER — ROCURONIUM BROMIDE 50 MG/5ML IV SOLN
INTRAVENOUS | Status: AC | PRN
  Administered 2018-12-28: 100 mg via INTRAVENOUS

## 2018-12-28 MED ORDER — ACETAMINOPHEN 650 MG RE SUPP: 650.0000 mg | RECTAL | Status: DC | PRN

## 2018-12-28 MED ORDER — LABETALOL HCL 5 MG/ML IV SOLN: 10.0000 mg | Freq: Once | INTRAVENOUS | Status: DC | PRN

## 2018-12-28 MED ORDER — FENTANYL CITRATE (PF) 100 MCG/2ML IJ SOLN
50.0000 ug | INTRAMUSCULAR | Status: DC | PRN
  Administered 2018-12-28 – 2018-12-30 (×5): 50 ug via INTRAVENOUS
  Filled 2018-12-28 (×7): qty 2

## 2018-12-28 MED ORDER — LORAZEPAM 2 MG/ML IJ SOLN
INTRAMUSCULAR | Status: AC
  Filled 2018-12-28: qty 1

## 2018-12-28 MED ORDER — ACETAMINOPHEN 325 MG PO TABS: 650.0000 mg | ORAL_TABLET | ORAL | Status: DC | PRN

## 2018-12-28 MED ORDER — BISACODYL 10 MG RE SUPP: 10.0000 mg | Freq: Every day | RECTAL | Status: DC | PRN

## 2018-12-28 MED ORDER — PROPOFOL 1000 MG/100ML IV EMUL
INTRAVENOUS | Status: AC
  Filled 2018-12-28: qty 100

## 2018-12-28 MED ORDER — FENTANYL CITRATE (PF) 100 MCG/2ML IJ SOLN: 25.0000 ug | INTRAMUSCULAR | Status: DC | PRN

## 2018-12-28 MED ORDER — FENTANYL CITRATE (PF) 100 MCG/2ML IJ SOLN: 50.0000 ug | INTRAMUSCULAR | Status: DC | PRN

## 2018-12-28 NOTE — Code Documentation (Signed)
78 yo male from home via [facility] to [facility] after being found by family after falling in the bathroom. Presents to ED with Left gaze and left weakness.  LSW 1830. NIHSS 30. CTA/P done. Ativan given for probable seizure activity. TPA initiated at 2102. Prior to TPA, additional IV access and BP control was necessary.

## 2018-12-28 NOTE — Consult Note (Signed)
NAME:  Eric Wade, MRN:  [id number], DOB:  04/18/1941,  LOS: 0   ADMISSION DATE:  12/28/2018,  CONSULTATION DATE:  12/28/2018 REFERRING MD:  Dr. Leonel Ramsay CHIEF COMPLAINT:  Altered mental status  Brief History   63 yoM presenting from home LSW 1700, with left gaze and left hemiplegia,hypertensive, and CBG high.  CTH negative.  TPA given and intubated for airway protection.   History of present illness   HPI obtained from medical chart review as patient is intubated on mechanical ventilation.  78 year old male with prior history of renal insufficiency, hypertension, hyperlipidemia, and DMT2 presenting from home with altered mental status.  Patient had been in his normal status of health, picking his wife up from dialysis, when he was found with altered mental status on bathroom floor.  LSW was 1800.  Presented as code stroke with EMS.  On arrival to ER around 2030, patient was flaccid on left with left gaze. He was afebrile, hypertensive with SBP ~210 and given labetalol with improvement.  CBG noted high.  Neurology present for code stroke.  Labs noted for corrected Na 140, glucose 706, CO2 24, sCr 2.04, AG normal, normal CBC and coags, ETOH neg, UDS pending.  Given concern for possible seizure, was given ativan 2 mg.  Taken to CT which did not show any acute findings.  TPA was then given.  CTA of head and neck was non acute.  He subsequently required intubation for airway protection in ER.  CXR pending. To be admitted by Neurology, PCCM consulted for ventilator management.   Past Medical History   renal insufficiency, HTN, HLD, DMT2, obesity, prostate cancer, ED, gout  Significant Hospital Events   4/29 Admitted   Consults:   Procedures:  4/29 ETT >>  Significant Diagnostic Tests:  4/29 CTH >> No acute finding by CT. Atrophy and chronic small-vessel ischemic Changes.  ASPECTS is 10  4/29 CTA head/ neck/ perfusion >> No large or medium vessel occlusion.  Negative perfusion  study.  No carotid bifurcation stenosis.  No posterior circulation stenosis.  Distal vessel atherosclerotic irregularity.  Micro Data:   Antimicrobials:   Interim history/subjective:   Objective   Blood pressure 104/68, pulse 77, resp. rate 16, height '6\' 1"'  (1.854 m), weight 84.1 kg, SpO2 100 %.    Vent Mode: PRVC FiO2 (%):  [100 %] 100 % Set Rate:  [16 bmp] 16 bmp Vt Set:  [630 mL] 630 mL PEEP:  [5 cmH20] 5 cmH20  No intake or output data in the 24 hours ending 12/28/18 2135 Filed Weights   12/28/18 2000  Weight: 84.1 kg   Examination: General:  Critically ill male sedated w/residual paralytics in NAD HEENT: MM pink/moist, ETT at 23, OGT, left pupils 4/brisk, right 3/brisk, anicteric  Neuro:  Sedated/ paralyzed from RSI CV: IRIR, no murmur PULM: even/non-labored w/MV breaths, lungs bilaterally clear, diminished in bases GI: soft, ND, +BS Extremities: warm/dry, no LE edema  Skin: no rashes   Resolved Hospital Problem list    Assessment & Plan:  Acute encephalopathy- w/ left gaze, left hemiplegia - ddx include stroke, seizure, toxic/ metabolic - hyperglycemia - s/p TPA - afebrile, normal WBC, pending CXR/ UA, no clear signs of infection P:  Per Neurology Pending EEG to r/o SE Ongoing neuro exams  Pending UDS TTE ordered MRI scheduled for tonight Trend WBC/ fever curve   Acute respiratory insufficiency due to above  P:  Full MV support PRVC 8 cc/kg, rate 16 Post intubation  ABG reviewed, no changes Pending CXR VAP bundle PPI Daily SBT  PAD protocol with prn fentanyl and propofol if needed, RASS goal 0/-1 w/ DWA  Hypertensive emergency- resolved - s/p labetalol P: Tele monitoring  Goal SBP <180; MAP >65 cardene as needed EKG and TTE ordered- pending  Check troponin   AKI Corrected Na 140 P:  Pending UA Trend BMP / urinary output Replace electrolytes as indicated Avoid nephrotoxic agents, ensure adequate renal perfusion  Hyperglycemia  Hx DM  P:  Check serum osmolaity 1L NS Insulin gtt per protocol Hourly CBGs UA pending  Best practice:  Diet: NPO Pain/Anxiety/Delirium protocol (if indicated): prn fentanyl, propofol if needed VAP protocol (if indicated): yes DVT prophylaxis: s/p TPA GI prophylaxis: PPI Glucose control: hourly CBGs/ insulin gtt Mobility: BR Code Status: Full  Family Communication:  Disposition: ICU  Labs   CBC: Recent Labs  Lab 12/28/18 2031  WBC 7.6  NEUTROABS 3.7  HGB 15.7  HCT 47.6  MCV 90.8  PLT 096    Basic Metabolic Panel: Recent Labs  Lab 12/28/18 2031 12/28/18 2037  NA 130*  --   K 4.0  --   CL 93*  --   CO2 24  --   GLUCOSE 706*  --   BUN 23  --   CREATININE 2.04* 1.90*  CALCIUM 9.3  --    GFR: Estimated Creatinine Clearance: 35 mL/min (A) (by C-G formula based on SCr of 1.9 mg/dL (H)). Recent Labs  Lab 12/28/18 2031  WBC 7.6    Liver Function Tests: Recent Labs  Lab 12/28/18 2031  AST 23  ALT 11  ALKPHOS 97  BILITOT 1.1  PROT 7.4  ALBUMIN 4.0   No results for input(s): LIPASE, AMYLASE in the last 168 hours. No results for input(s): AMMONIA in the last 168 hours.  ABG No results found for: PHART, PCO2ART, PO2ART, HCO3, TCO2, ACIDBASEDEF, O2SAT   Coagulation Profile: Recent Labs  Lab 12/28/18 2031  INR 0.9    Cardiac Enzymes: No results for input(s): CKTOTAL, CKMB, CKMBINDEX, TROPONINI in the last 168 hours.  HbA1C: Hgb A1c MFr Bld  Date/Time Value Ref Range Status  09/14/2017 02:39 PM 7.0 (H) 4.6 - 6.5 % Final    Comment:    Glycemic Control Guidelines for People with Diabetes:Non Diabetic:  <6%Goal of Therapy: <7%Additional Action Suggested:  >8%   03/11/2017 03:53 PM 7.2 (H) 4.6 - 6.5 % Final    Comment:    Glycemic Control Guidelines for People with Diabetes:Non Diabetic:  <6%Goal of Therapy: <7%Additional Action Suggested:  >8%     CBG: Recent Labs  Lab 12/28/18 2057  GLUCAP >600*    Review of Systems:   Unable  Past Medical  History  He,  has a past medical history of Allergic rhinitis (08/25/2016), DIABETES MELLITUS, TYPE II (05/08/2007), ERECTILE DYSFUNCTION (05/08/2007), GOUT (07/01/2007), HYPERLIPIDEMIA (05/08/2007), HYPERTENSION (05/08/2007), OBESITY (05/08/2007), Preventative health care (12/28/2010), Prostate cancer ([place]) (07/02/2008), PSA, INCREASED (07/02/2008), and RENAL INSUFFICIENCY (07/01/2007).   Surgical History   No past surgical history on file.   Social History   reports that he has quit smoking. He has never used smokeless tobacco. He reports that he does not drink alcohol or use drugs.   Family History   His family history includes Hypertension in an other family member.   Allergies Allergies  Allergen Reactions  . Nsaids     REACTION: renal insufficiency     Home Medications  Prior to Admission medications   Medication  Sig Start Date End Date Taking? Authorizing Provider  amLODipine (NORVASC) 10 MG tablet  10/19/14   [provider]  aspirin 81 MG EC tablet Take 81 mg by mouth daily.      [provider]  blood glucose meter kit and supplies KIT Pt receives from [facility] 01/10/15   [name], MD  DILT-XR 240 MG 24 hr capsule TAKE 1 CAPSULE (240 MG TOTAL) BY MOUTH DAILY. 04/19/17   [name], MD  furosemide (LASIX) 40 MG tablet TAKE 1 TABLET (40 MG TOTAL) BY MOUTH DAILY. 04/19/17   [name], MD  Insulin Glargine (LANTUS) 100 UNIT/ML Solostar Pen Inject 40 Units into the skin daily at 10 pm. 01/16/15   [name], MD  Lancet Devices (AUTO-LANCET) MISC Pt received from [facility] 01/10/15   [name], MD  lisinopril (PRINIVIL,ZESTRIL) 20 MG tablet TAKE 1 TABLET EVERY DAY 03/16/17   [name], MD  pioglitazone (ACTOS) 30 MG tablet TAKE 1 TABLET (30 MG TOTAL) BY MOUTH DAILY. 03/16/17   [name], MD  TRUE METRIX BLOOD GLUCOSE TEST test strip TEST AS DIRECTED TWICE PER DAY 01/25/18   [name], MD     Critical care time: 40 mins    Kennieth Rad, MSN,  AGACNP-BC  Pulmonary & Critical Care Pgr: [phone] or if no answer [phone] 12/28/2018, 11:16 PM

## 2018-12-28 NOTE — ED Notes (Signed)
[place]  --[place]

## 2018-12-28 NOTE — ED Triage Notes (Signed)
Per EMS pt lkw at approximately 1700 by family when he returned from taking his wife to dialysis.  Was found down sometime later by family in the bathroom having fallen from the toilet. C collar in place. Pt had left sided flacidity and left gaze with incontinence.

## 2018-12-28 NOTE — Discharge Instructions (Addendum)
Carbohydrate Counting for People with Diabetes  Why Is Carbohydrate Counting Important?   Counting carbohydrate servings may help you control your blood glucose level so that you feel better.   The balance between the carbohydrates you eat and insulin determines what your blood glucose level will be after eating.   Carbohydrate counting can also help you plan your meals.   Which Foods Have Carbohydrates?  Foods with carbohydrates include:   Breads, crackers, and cereals   Pasta, rice, and grains   Starchy vegetables, such as potatoes, corn, and peas   Beans and legumes   Milk, soy milk, and yogurt   Fruits and fruit juices   Sweets, such as cakes, cookies, ice cream, jam, and jelly   Carbohydrate Servings  In diabetes meal planning, 1 serving of a food with carbohydrate has about 15 grams of carbohydrate:   Check serving sizes with measuring cups and spoons or a food scale.   Read the Nutrition Facts on food labels to find out how many grams of carbohydrate are in foods you eat.   The food lists in this handout show portions that have about 15 grams of carbohydrate.  Copyright Academy of Nutrition and Dietetics. This handout may be duplicated for client education. Carbohydrate Counting for People with Diabetes--Page 2   Meal Planning Tips   An Eating Plan tells you how many carbohydrate servings to eat at your meals and snacks. For many adults, eating 3 to 5 servings of carbohydrate foods at each meal and 1 or 2 carbohydrate servings for each snack works well.   In a healthy daily Eating Plan, most carbohydrates come from:  o At least 6 servings of fruits and nonstarchy vegetables  o At least 6 servings of grains, beans, and starchy vegetables, with at least 3 servings from whole grains  o At least 2 servings of milk or milk products   Check your blood glucose level regularly. It can tell you if you need to adjust when you eat carbohydrates.   Eating foods that have  fiber, such as whole grains, and having very few salty foods is good for your health.   Eat 4 to 6 ounces of meat or other protein foods (such as soybean burgers) each day. Choose low-fat sources of protein, such as lean beef, lean pork, chicken, fish, low-fat cheese, or vegetarian foods such as soy.   Eat some healthy fats, such as olive oil, canola oil, and nuts.   Eat very little saturated fats. These unhealthy fats are found in butter, cream, and high-fat meats, such as bacon and sausage.   Eat very little or no trans fats. These unhealthy fats are found in all foods that list partially hydrogenated oil as an ingredient.   Label Reading Tips  The Nutrition Facts panel on a label lists the grams of total carbohydrate in 1 standard serving. The labels standard serving may be larger or smaller than 1 carbohydrate serving.  To figure out how many carbohydrate servings are in the food:   First, look at the labels standard serving size.   Check the grams of total carbohydrate. This is the amount of carbohydrate in 1 standard serving.   Divide the grams of total carbohydrate by 15. This number equals the number of carbohydrate servings in 1 standard serving. Remember: 1 carbohydrate serving is 15 grams of carbohydrate.   Note: You may ignore the grams of sugars on the Nutrition Facts panel because they are included in the grams of  total carbohydrate. Copyright Academy of Nutrition and Dietetics. This handout may be duplicated for client education. Carbohydrate Counting for People with Diabetes--Page 3   Food Lists for Carbohydrate Counting  1 serving = about 15 grams of carbohydrate  Starches   1 slice bread (1 ounce)   1 tortilla (6-inch size)    large bagel (1 ounce)   2 taco shells (5-inch size)    hamburger or hot dog bun ( ounce)    cup ready-to-eat unsweetened cereal    cup cooked cereal   1 cup broth-based soup   4 to 6 small crackers   ? cup pasta or rice  (cooked)    cup beans, peas, corn, sweet potatoes, winter squash, or mashed or boiled potatoes (cooked)    large baked potato (3 ounces)    ounce pretzels, potato chips, or tortilla chips   3 cups popcorn (popped)   Sweets and Desserts   2-inch square cake (unfrosted)   2 small cookies (? ounce)    cup ice cream or frozen yogurt    cup sherbet or sorbet   1 tablespoon syrup, jam, jelly, table sugar, or honey   2 tablespoons light syrup   Milk   1 cup fat-free or reduced-fat milk   1 cup soy milk   ? cup (6 ounces) nonfat yogurt sweetened with sugar-free sweetener   Fruit   1 small fresh fruit ( to 1 cup)    cup canned or frozen fruit   2 tablespoons dried fruit (blueberries, cherries, cranberries, mixed fruit, raisins)   17 small grapes (3 ounces)   1 cup melon or berries    cup unsweetened fruit juice   Other Foods   Count 1 cup raw vegetables or  cup cooked nonstarchy vegetables as zero (0) carbohydrate servings or free foods. If you eat 3 or more servings at one meal, count them as 1 carbohydrate serving.   Foods that have less than 20 calories in each serving also may be counted as zero carbohydrate servings or free foods.   Count 1 cup of casserole or other mixed foods as 2 carbohydrate servings.  Copyright Academy of Nutrition and Dietetics. This handout may be duplicated for client education. Carbohydrate Counting for People with Diabetes--Page 4   Carbohydrate Counting for People with Diabetes Sample 1-Day Menu Meal  Menu   Breakfast  1 extra-small banana (1 carbohydrate serving)   cup corn flakes (1 carbohydrate serving) with 1 cup low-fat or fat-free milk (1 carbohydrate serving)  1 slice whole wheat bread (1 carbohydrate serving) with 1 teaspoon margarine   Lunch  2 ounces [place] slices with 2 lettuce leaves on 2 slices whole wheat bread (2 carbohydrate servings)  4 celery sticks  4 carrot sticks  1 medium apple (1 carbohydrate  serving)  1 cup low-fat or fat-free milk (1 carbohydrate serving)   Afternoon Snack  2 tablespoons raisins (1 carbohydrate serving)   ounce unsalted mini pretzels (1 carbohydrate serving)   Evening Meal  3 ounces lean roast beef  1/2 large baked potato (2 carbohydrate servings) with 1 tablespoon reduced-fat sour cream  1/2 cup green beans  1 cup vegetable salad with 1 tablespoon light salad dressing  1 whole wheat dinner roll (1 carbohydrate serving) with 1 teaspoon margarine  1 cup melon balls (1 carbohydrate serving)   Evening Snack  6 ounces low-fat sugar-free fruit yogurt (1 carbohydrate serving)  2 tablespoons unsalted nuts    Corrin Parker, MS, RD, LDN Clinical  Dietitian Office phone # [phone]

## 2018-12-28 NOTE — Progress Notes (Signed)
EEG completed, results pending. 

## 2018-12-28 NOTE — H&P (Addendum)
Neurology H&P  CC: Left-sided weakness  History is obtained from: Daughter  HPI: Eric Wade is a 78 y.o. male with a history of diabetes, hypertension, hyperlipidemia who presented with left-sided weakness.  He was last in his normal state of health around 6 to 6:30 PM after driving his wife home from dialysis around 5 PM.  He made her something to eat, and then said he had to go to the bathroom.  While going to the bathroom he became dizzy, and then collapsed, but did not lose consciousness.  On EMS arrival, they noted he was plegic on the left side, but moving the right side purposefully.  On route, they felt that his mental status was getting slightly worse his speech was not understandable.  He was taken for the CT scanner where was noted that he had a dense left hemiplegia, but was purposeful with his right side.  His speech was not understandable and he did not follow commands.  He had a leftward gaze without nystagmus.  A CT was performed, demonstrated no intracranial bleeding.  I discussed risks and benefits of IV TPA with his daughter who agreed with IV TPA.  A CTA/CTP was performed with concern for basilar occlusion, however this was negative.  Because he had a leftward gaze with left-sided weakness, a trial of 2 mg of IV Ativan was attempted, but there was no other evidence of ongoing seizure activity.  His mental status did not improve following the Ativan, and in fact continued to decline requiring intubation.  LKW: 6 PM tpa given?:  Yes IR Thrombectomy? No, no LVO Modified Rankin Scale: 0-Completely asymptomatic and back to baseline post- stroke NIHSS: 30   ROS: Unable to obtain due to altered mental status.   Past Medical History:  Diagnosis Date  . Allergic rhinitis 08/25/2016  . DIABETES MELLITUS, TYPE II 05/08/2007  . ERECTILE DYSFUNCTION 05/08/2007  . GOUT 07/01/2007  . HYPERLIPIDEMIA 05/08/2007  . HYPERTENSION 05/08/2007  . OBESITY 05/08/2007  . Preventative health  care 12/28/2010  . Prostate cancer ([place]) 07/02/2008   Qualifier: Diagnosis of  By: Jenny Reichmann MD, Hunt Oris   . PSA, INCREASED 07/02/2008  . RENAL INSUFFICIENCY 07/01/2007     Family History  Problem Relation Age of Onset  . Hypertension Other      Social History:  reports that he has quit smoking. He has never used smokeless tobacco. He reports that he does not drink alcohol or use drugs.   Prior to Admission medications   Medication Sig Start Date End Date Taking? Authorizing Provider  amLODipine (NORVASC) 10 MG tablet  10/19/14   [provider]  aspirin 81 MG EC tablet Take 81 mg by mouth daily.      [provider]  blood glucose meter kit and supplies KIT Pt receives from [facility] 01/10/15   [name], MD  DILT-XR 240 MG 24 hr capsule TAKE 1 CAPSULE (240 MG TOTAL) BY MOUTH DAILY. 04/19/17   [name], MD  furosemide (LASIX) 40 MG tablet TAKE 1 TABLET (40 MG TOTAL) BY MOUTH DAILY. 04/19/17   [name], MD  Insulin Glargine (LANTUS) 100 UNIT/ML Solostar Pen Inject 40 Units into the skin daily at 10 pm. 01/16/15   [name], MD  Lancet Devices (AUTO-LANCET) MISC Pt received [facility] 01/10/15   [name], MD  lisinopril (PRINIVIL,ZESTRIL) 20 MG tablet TAKE 1 TABLET EVERY DAY 03/16/17   [name], MD  pioglitazone (ACTOS) 30  MG tablet TAKE 1 TABLET (30 MG TOTAL) BY MOUTH DAILY. 03/16/17   [name], MD  TRUE METRIX BLOOD GLUCOSE TEST test strip TEST AS DIRECTED TWICE PER DAY 01/25/18   [name], MD     Exam: Current vital signs: BP (!) 138/92   Pulse 88   Temp 98 F (36.7 C) (Axillary)   Resp 16   Ht '6\' 1"'  (1.854 m)   Wt 84.1 kg   SpO2 100%   BMI 24.46 kg/m    Physical Exam  Constitutional: Appears well-developed and well-nourished.  Psych: Affect appropriate to situation Eyes: No scleral injection HENT: No OP obstrucion Head: Normocephalic.  Cardiovascular: Normal rate and regular rhythm.  Respiratory: Effort normal and breath  sounds normal to anterior ascultation GI: Soft.  No distension. There is no tenderness.  Skin: WDI  Neuro: Mental Status: Patient is obtunded, but does arouse to noxious stimulation. He is severely dysarthric, not understandable Cranial Nerves: II: Does not clearly blink to threat from the right, because of eye deviation unable to check from the left. pupils are equal, round, and reactive to light.   III,IV, VI: Left eye deviation V: VII: Left facial weakness Motor: He was completely flaccid throughout the left side, moving the right side purposefully on arrival.   Sensory: He had no response to noxious stimulation on the right despite purposeful movements on that side.   Cerebellar: Does not perform   I have reviewed labs in epic and the pertinent results are: Creatinine 1.9  I have reviewed the images obtained: CTA/CTP-negative, CT-no acute findings  Primary Diagnosis:  Other cerebral infarction due to occlusion of stenosis of small artery.  Secondary Diagnosis: Hypertension Emergency (SBP > 180 or DBP > 120 & end organ damage), Type 2 diabetes mellitus with hyperglycemia , CKD Stage 3 (GFR 30-59) and Hyponatremia   Impression: 78 year old male with acute left-sided weakness with left eyes deviation.  Given report that his blood pressure had been elevated, I continue to have concern for ischemic stroke despite "wrong way eyes" which can occur rarely with supratentorial or thalamic strokes.  In this setting, I did discuss IV TPA with the family and proceed to treat.  Once CTA/CTP demonstrated no infarct, seizure due to hyperglycemia was felt to be a consideration, hence the Ativan.  Currently, I continue to think that he had no other clear evidence of seizure activity and therefore stroke remains a strong possibility.  I will call an EEG tech to rule out status epilepticus.  For now we will treat as ischemic stroke.  Plan: - HgbA1c, fasting lipid panel - MRI of the brain  without contrast - Frequent neuro checks - Echocardiogram - Prophylactic therapy-none for 24 hours - Risk factor modification - Telemetry monitoring - PT consult, OT consult, Speech consult - Stroke team to follow -Stat EEG -CCM consult for hyperglycemia as well as airway management -Hold home antihypertensives for the time being, could use Cleviprex as needed -Hyponatremia- likely will improve with hydration and correction of blood sugar  This patient is critically ill and at significant risk of neurological worsening, death and care requires constant monitoring of vital signs, hemodynamics,respiratory and cardiac monitoring, neurological assessment, discussion with family, other specialists and medical decision making of high complexity. I spent 85 minutes of neurocritical care time  in the care of  this patient. This was time spent independent of any time provided by nurse practitioner or PA.  Roland Rack, MD [facility] [phone]  If  7pm- 7am, please page neurology on call as listed in [place].

## 2018-12-28 NOTE — Progress Notes (Signed)
Patient transported on ventilator to 9S85 with no complications. Vitals remained stable.

## 2018-12-28 NOTE — Progress Notes (Signed)
Pharmacist Code Stroke Response  Notified to mix tPA at 2054 by Dr. Leonel Ramsay Delivered tPA to RN at 2058  tPA dose = 7.6mg  bolus over 1 minute followed by 68.1mg  for a total dose of 75.7mg  over 1 hour  Issues/delays encountered (if applicable): BP required treatment  Eric Wade, Eric Wade 12/28/18 9:20 PM

## 2018-12-28 NOTE — ED Provider Notes (Signed)
[facility] Provider Note   CSN: [id number] Arrival date & time: 12/28/18  2029    History   Chief Complaint Chief Complaint  Patient presents with   Code Stroke    HPI Eric Wade is a 78 y.o. male.     78 yo M with a cc of sudden onset left sided weakness and difficulty talking.  Arrived via EMS as a code stroke, significant mental status change enroute now non verbal.  Level V caveat.   The history is provided by the patient.    Past Medical History:  Diagnosis Date   Allergic rhinitis 08/25/2016   DIABETES MELLITUS, TYPE II 05/08/2007   ERECTILE DYSFUNCTION 05/08/2007   GOUT 07/01/2007   HYPERLIPIDEMIA 05/08/2007   HYPERTENSION 05/08/2007   OBESITY 05/08/2007   Preventative health care 12/28/2010   Prostate cancer ([place]) 07/02/2008   Qualifier: Diagnosis of  By: Jenny Reichmann MD, Hunt Oris    PSA, INCREASED 07/02/2008   RENAL INSUFFICIENCY 07/01/2007    Patient Active Problem List   Diagnosis Date Noted   Stroke [facility]) 12/28/2018   Allergic rhinitis 08/25/2016   Preventative health care 12/28/2010   Prostate cancer ([place]) 07/02/2008   GOUT 07/01/2007   CKD (chronic kidney disease) stage 3, GFR 30-59 ml/min ([place]) 07/01/2007   Diabetes ([place]) 05/08/2007   Hyperlipidemia 05/08/2007   OBESITY 05/08/2007   ERECTILE DYSFUNCTION 05/08/2007   Essential hypertension 05/08/2007    No past surgical history on file.      Home Medications    Prior to Admission medications   Medication Sig Start Date End Date Taking? Authorizing Provider  amLODipine (NORVASC) 10 MG tablet  10/19/14   [provider]  aspirin 81 MG EC tablet Take 81 mg by mouth daily.      [provider]  blood glucose meter kit and supplies KIT Pt receives from [facility] 01/10/15   [name], MD  DILT-XR 240 MG 24 hr capsule TAKE 1 CAPSULE (240 MG TOTAL) BY MOUTH DAILY. 04/19/17   [name], MD  furosemide (LASIX) 40 MG tablet TAKE 1  TABLET (40 MG TOTAL) BY MOUTH DAILY. 04/19/17   [name], MD  Insulin Glargine (LANTUS) 100 UNIT/ML Solostar Pen Inject 40 Units into the skin daily at 10 pm. 01/16/15   [name], MD  Lancet Devices (AUTO-LANCET) MISC Pt received from [facility] 01/10/15   [name], MD  lisinopril (PRINIVIL,ZESTRIL) 20 MG tablet TAKE 1 TABLET EVERY DAY 03/16/17   [name], MD  pioglitazone (ACTOS) 30 MG tablet TAKE 1 TABLET (30 MG TOTAL) BY MOUTH DAILY. 03/16/17   [name], MD  TRUE METRIX BLOOD GLUCOSE TEST test strip TEST AS DIRECTED TWICE PER DAY 01/25/18   [name], MD    Family History Family History  Problem Relation Age of Onset   Hypertension Other     Social History Social History   Tobacco Use   Smoking status: Former Smoker   Smokeless tobacco: Never Used  Substance Use Topics   Alcohol use: No   Drug use: No     Allergies   Nsaids   Review of Systems Review of Systems  Unable to perform ROS: Mental status change     Physical Exam Updated Vital Signs BP (!) 160/141    Pulse 96    Temp 98.4 F (36.9 C) (Oral)    Resp 20    Ht '6\' 1"'  (1.854 m)  Wt 84.1 kg    SpO2 97%    BMI 24.46 kg/m   Physical Exam Vitals signs and nursing note reviewed.  Constitutional:      Appearance: He is well-developed.  HENT:     Head: Normocephalic and atraumatic.  Eyes:     Pupils: Pupils are equal, round, and reactive to light.  Neck:     Musculoskeletal: Normal range of motion and neck supple.     Vascular: No JVD.  Cardiovascular:     Rate and Rhythm: Normal rate and regular rhythm.     Heart sounds: No murmur. No friction rub. No gallop.   Pulmonary:     Effort: No respiratory distress.     Breath sounds: No wheezing.  Abdominal:     General: There is no distension.     Tenderness: There is no guarding or rebound.  Musculoskeletal: Normal range of motion.  Skin:    Coloration: Skin is not pale.     Findings: No rash.  Neurological:     Mental  Status: He is alert.     GCS: GCS eye subscore is 4. GCS verbal subscore is 1.     Comments: Left-sided gaze deviation, some twitching of the eyes.  Purposeful movement with the right upper extremity though does not localize the pain      ED Treatments / Results  Labs (all labs ordered are listed, but only abnormal results are displayed) Labs Reviewed  COMPREHENSIVE METABOLIC PANEL - Abnormal; Notable for the following components:      Result Value   Sodium 130 (*)    Chloride 93 (*)    Glucose, Bld 706 (*)    Creatinine, Ser 2.04 (*)    GFR calc non Af Amer 30 (*)    GFR calc Af Amer 35 (*)    All other components within normal limits  URINALYSIS, ROUTINE W REFLEX MICROSCOPIC - Abnormal; Notable for the following components:   Color, Urine STRAW (*)    Specific Gravity, Urine 1.031 (*)    Glucose, UA >=500 (*)    Hgb urine dipstick SMALL (*)    Protein, ur 30 (*)    Bacteria, UA RARE (*)    All other components within normal limits  I-STAT CREATININE, ED - Abnormal; Notable for the following components:   Creatinine, Ser 1.90 (*)    All other components within normal limits  CBG MONITORING, ED - Abnormal; Notable for the following components:   Glucose-Capillary >600 (*)    All other components within normal limits  POCT I-STAT 7, (LYTES, BLD GAS, ICA,H+H) - Abnormal; Notable for the following components:   pCO2 arterial 49.2 (*)    pO2, Arterial 302.0 (*)    Sodium 131 (*)    All other components within normal limits  MRSA PCR SCREENING  ETHANOL  PROTIME-INR  APTT  CBC  DIFFERENTIAL  RAPID URINE DRUG SCREEN, HOSP PERFORMED  CBC  HEMOGLOBIN A1C  LIPID PANEL  TRIGLYCERIDES  TROPONIN I  RENAL FUNCTION PANEL  MAGNESIUM  OSMOLALITY    EKG None  Radiology Ct Angio Head W Or Wo Contrast  Result Date: 12/28/2018 CLINICAL DATA:  Acute presentation with confusion and left-sided weakness. EXAM: CT ANGIOGRAPHY HEAD AND NECK CT PERFUSION BRAIN TECHNIQUE:  Multidetector CT imaging of the head and neck was performed using the standard protocol during bolus administration of intravenous contrast. Multiplanar CT image reconstructions and MIPs were obtained to evaluate the vascular anatomy. Carotid stenosis measurements (when applicable) are  obtained utilizing NASCET criteria, using the distal internal carotid diameter as the denominator. Multiphase CT imaging of the brain was performed following IV bolus contrast injection. Subsequent parametric perfusion maps were calculated using RAPID software. CONTRAST:  126m OMNIPAQUE IOHEXOL 350 MG/ML SOLN COMPARISON:  Head CT same day FINDINGS: CTA NECK FINDINGS Aortic arch: Atherosclerotic calcification. No aneurysm or dissection. Branching pattern is normal. Right carotid system: Common carotid artery widely patent to the bifurcation. Atherosclerotic calcification at the carotid bifurcation and ICA bulb but no stenosis. Cervical ICA widely patent. Left carotid system: Common carotid artery widely patent to the bifurcation. Calcified plaque at the bifurcation but no stenosis. Cervical ICA widely patent beyond that. Vertebral arteries: Both vertebral artery origins are widely patent. Both vertebral arteries appear patent and normal through the cervical region to the foramen magnum. Skeleton: Ordinary cervical spondylosis. Other neck: No mass or lymphadenopathy. Upper chest: Emphysema and pulmonary scarring.  No active process. Review of the MIP images confirms the above findings CTA HEAD FINDINGS Anterior circulation: Both internal carotid arteries are widely patent through the skull base and siphon regions. There is siphon calcification but no stenosis greater than 20%. The anterior and middle cerebral vessels are patent without proximal stenosis, aneurysm or vascular malformation. No occluded large or medium vessels are identified. Distal vessels do show some atherosclerotic irregularity. Posterior circulation: Both vertebral  arteries widely patent to the basilar. No basilar stenosis. Posterior circulation branch vessels are normal. Distal vessels do show some atherosclerotic irregularity. Venous sinuses: Patent and normal. Anatomic variants: None significant. Delayed phase: Not performed Review of the MIP images confirms the above findings CT Brain Perfusion Findings: ASPECTS: 10 CBF (<30%) Volume: 074mPerfusion (Tmax>6.0s) volume: 55m44mismatch Volume: 55mL29mfarction Location:None IMPRESSION: No large or medium vessel occlusion.  Negative perfusion study. No carotid bifurcation stenosis.  No posterior circulation stenosis. Distal vessel atherosclerotic irregularity. Electronically Signed   By: MarkNelson Chimes.   On: 12/28/2018 21:07   Ct Angio Neck W Or Wo Contrast  Result Date: 12/28/2018 CLINICAL DATA:  Acute presentation with confusion and left-sided weakness. EXAM: CT ANGIOGRAPHY HEAD AND NECK CT PERFUSION BRAIN TECHNIQUE: Multidetector CT imaging of the head and neck was performed using the standard protocol during bolus administration of intravenous contrast. Multiplanar CT image reconstructions and MIPs were obtained to evaluate the vascular anatomy. Carotid stenosis measurements (when applicable) are obtained utilizing NASCET criteria, using the distal internal carotid diameter as the denominator. Multiphase CT imaging of the brain was performed following IV bolus contrast injection. Subsequent parametric perfusion maps were calculated using RAPID software. CONTRAST:  1055mL25mIPAQUE IOHEXOL 350 MG/ML SOLN COMPARISON:  Head CT same day FINDINGS: CTA NECK FINDINGS Aortic arch: Atherosclerotic calcification. No aneurysm or dissection. Branching pattern is normal. Right carotid system: Common carotid artery widely patent to the bifurcation. Atherosclerotic calcification at the carotid bifurcation and ICA bulb but no stenosis. Cervical ICA widely patent. Left carotid system: Common carotid artery widely patent to the  bifurcation. Calcified plaque at the bifurcation but no stenosis. Cervical ICA widely patent beyond that. Vertebral arteries: Both vertebral artery origins are widely patent. Both vertebral arteries appear patent and normal through the cervical region to the foramen magnum. Skeleton: Ordinary cervical spondylosis. Other neck: No mass or lymphadenopathy. Upper chest: Emphysema and pulmonary scarring.  No active process. Review of the MIP images confirms the above findings CTA HEAD FINDINGS Anterior circulation: Both internal carotid arteries are widely patent through the skull base and siphon regions. There is siphon  calcification but no stenosis greater than 20%. The anterior and middle cerebral vessels are patent without proximal stenosis, aneurysm or vascular malformation. No occluded large or medium vessels are identified. Distal vessels do show some atherosclerotic irregularity. Posterior circulation: Both vertebral arteries widely patent to the basilar. No basilar stenosis. Posterior circulation branch vessels are normal. Distal vessels do show some atherosclerotic irregularity. Venous sinuses: Patent and normal. Anatomic variants: None significant. Delayed phase: Not performed Review of the MIP images confirms the above findings CT Brain Perfusion Findings: ASPECTS: 10 CBF (<30%) Volume: 64m Perfusion (Tmax>6.0s) volume: 0337mMismatch Volume: 37m62mnfarction Location:None IMPRESSION: No large or medium vessel occlusion.  Negative perfusion study. No carotid bifurcation stenosis.  No posterior circulation stenosis. Distal vessel atherosclerotic irregularity. Electronically Signed   By: MarNelson ChimesD.   On: 12/28/2018 21:07   Ct Cerebral Perfusion W Contrast  Result Date: 12/28/2018 CLINICAL DATA:  Acute presentation with confusion and left-sided weakness. EXAM: CT ANGIOGRAPHY HEAD AND NECK CT PERFUSION BRAIN TECHNIQUE: Multidetector CT imaging of the head and neck was performed using the standard protocol  during bolus administration of intravenous contrast. Multiplanar CT image reconstructions and MIPs were obtained to evaluate the vascular anatomy. Carotid stenosis measurements (when applicable) are obtained utilizing NASCET criteria, using the distal internal carotid diameter as the denominator. Multiphase CT imaging of the brain was performed following IV bolus contrast injection. Subsequent parametric perfusion maps were calculated using RAPID software. CONTRAST:  1037m52mNIPAQUE IOHEXOL 350 MG/ML SOLN COMPARISON:  Head CT same day FINDINGS: CTA NECK FINDINGS Aortic arch: Atherosclerotic calcification. No aneurysm or dissection. Branching pattern is normal. Right carotid system: Common carotid artery widely patent to the bifurcation. Atherosclerotic calcification at the carotid bifurcation and ICA bulb but no stenosis. Cervical ICA widely patent. Left carotid system: Common carotid artery widely patent to the bifurcation. Calcified plaque at the bifurcation but no stenosis. Cervical ICA widely patent beyond that. Vertebral arteries: Both vertebral artery origins are widely patent. Both vertebral arteries appear patent and normal through the cervical region to the foramen magnum. Skeleton: Ordinary cervical spondylosis. Other neck: No mass or lymphadenopathy. Upper chest: Emphysema and pulmonary scarring.  No active process. Review of the MIP images confirms the above findings CTA HEAD FINDINGS Anterior circulation: Both internal carotid arteries are widely patent through the skull base and siphon regions. There is siphon calcification but no stenosis greater than 20%. The anterior and middle cerebral vessels are patent without proximal stenosis, aneurysm or vascular malformation. No occluded large or medium vessels are identified. Distal vessels do show some atherosclerotic irregularity. Posterior circulation: Both vertebral arteries widely patent to the basilar. No basilar stenosis. Posterior circulation branch  vessels are normal. Distal vessels do show some atherosclerotic irregularity. Venous sinuses: Patent and normal. Anatomic variants: None significant. Delayed phase: Not performed Review of the MIP images confirms the above findings CT Brain Perfusion Findings: ASPECTS: 10 CBF (<30%) Volume: 37mL 737mfusion (Tmax>6.0s) volume: 37mL M47match Volume: 37mL In57mction Location:None IMPRESSION: No large or medium vessel occlusion.  Negative perfusion study. No carotid bifurcation stenosis.  No posterior circulation stenosis. Distal vessel atherosclerotic irregularity. Electronically Signed   By: Mark  SNelson Chimes On: 12/28/2018 21:07   Dg Chest Port 1 View  Result Date: 12/28/2018 CLINICAL DATA:  Acute stroke. Endotracheal tube nasogastric tube placement. EXAM: PORTABLE CHEST 1 VIEW COMPARISON:  None. FINDINGS: Endotracheal tube and nasogastric tube are seen in appropriate position. No evidence of pneumothorax. Atelectasis or consolidation is seen in the left  lung base. Right lung is clear. IMPRESSION: 1. Endotracheal tube and nasogastric tube in appropriate position. 2. Left basilar atelectasis versus consolidation. Electronically Signed   By: Earle Gell M.D.   On: 12/28/2018 22:44   Ct Head Code Stroke Wo Contrast  Result Date: 12/28/2018 CLINICAL DATA:  Code stroke.  Confusion.  Left-sided weakness. EXAM: CT HEAD WITHOUT CONTRAST TECHNIQUE: Contiguous axial images were obtained from the base of the skull through the vertex without intravenous contrast. COMPARISON:  None. FINDINGS: Brain: Generalized atrophy. Brainstem and cerebellum are unremarkable. Old appearing small vessel insults affecting the thalami, basal ganglia and hemispheric white matter. No sign of acute infarction, mass lesion, hemorrhage, hydrocephalus or extra-axial collection. Vascular: There is atherosclerotic calcification of the major vessels at the base of the brain. Skull: Negative Sinuses/Orbits: Previous sinus surgery. Mild chronic  mucosal thickening. Other: None ASPECTS ([place] Stroke Program Early CT Score) - Ganglionic level infarction (caudate, lentiform nuclei, internal capsule, insula, M1-M3 cortex): 7 - Supraganglionic infarction (M4-M6 cortex): 3 Total score (0-10 with 10 being normal): 10 IMPRESSION: 1. No acute finding by CT. Atrophy and chronic small-vessel ischemic changes. 2. ASPECTS is 10. 3. These results were communicated to Dr. Leonel Ramsay at 8:49 pmon 4/29/2020by text page via the [facility] messaging system. Electronically Signed   By: Nelson Chimes M.D.   On: 12/28/2018 20:49    Procedures Procedure Name: Intubation Date/Time: 12/28/2018 11:48 PM Performed by: Deno Etienne, DO Pre-anesthesia Checklist: Patient identified, Patient being monitored, Emergency Drugs available, Timeout performed and Suction available Oxygen Delivery Method: Non-rebreather mask Preoxygenation: Pre-oxygenation with 100% oxygen Induction Type: Rapid sequence Ventilation: Mask ventilation without difficulty Laryngoscope Size: Glidescope Grade View: Grade I Tube size: 8.0 mm Number of attempts: 1 Placement Confirmation: ETT inserted through vocal cords under direct vision,  Positive ETCO2,  CO2 detector and Breath sounds checked- equal and bilateral Secured at: 23 cm Tube secured with: ETT holder Dental Injury: Teeth and Oropharynx as per pre-operative assessment  Difficulty Due To: Difficulty was anticipated Future Recommendations: Recommend- induction with short-acting agent, and alternative techniques readily available      (including critical care time)  Medications Ordered in ED Medications  LORazepam (ATIVAN) 2 MG/ML injection (has no administration in time range)   stroke: mapping our early stages of recovery book (has no administration in time range)  0.9 %  sodium chloride infusion (50 mL/hr Intravenous New Bag/Given 12/28/18 2239)  acetaminophen (TYLENOL) tablet 650 mg (has no administration in time range)    Or    acetaminophen (TYLENOL) solution 650 mg (has no administration in time range)    Or  acetaminophen (TYLENOL) suppository 650 mg (has no administration in time range)  labetalol (NORMODYNE) injection 10 mg (has no administration in time range)    And  nicardipine (CARDENE) 28m in 0.86% saline 2069mIV infusion (0.1 mg/ml) (5 mg/hr Intravenous New Bag/Given 12/28/18 2257)  pantoprazole (PROTONIX) injection 40 mg (has no administration in time range)  fentaNYL (SUBLIMAZE) injection 50 mcg (has no administration in time range)  fentaNYL (SUBLIMAZE) injection 50 mcg (50 mcg Intravenous Given 12/28/18 2313)  midazolam (VERSED) injection 1 mg (has no administration in time range)  midazolam (VERSED) injection 1 mg (has no administration in time range)  dextrose 5 %-0.45 % sodium chloride infusion (has no administration in time range)  insulin regular, human (MYXREDLIN) 100 units/ 100 mL infusion (4.5 Units/hr Intravenous New Bag/Given 12/28/18 2324)  chlorhexidine gluconate (MEDLINE KIT) (PERIDEX) 0.12 % solution 15 mL (15 mLs Mouth Rinse  Given 12/28/18 2315)  MEDLINE mouth rinse (15 mLs Mouth Rinse Given 12/28/18 2347)  fentaNYL (SUBLIMAZE) injection 25 mcg (has no administration in time range)  propofol (DIPRIVAN) 1000 MG/100ML infusion (30 mcg/kg/min  84.1 kg Intravenous New Bag/Given 12/28/18 2310)  docusate (COLACE) 50 MG/5ML liquid 100 mg (has no administration in time range)  bisacodyl (DULCOLAX) suppository 10 mg (has no administration in time range)  LORazepam (ATIVAN) injection 2 mg (2 mg Intravenous Given 12/28/18 2043)  alteplase (ACTIVASE) 1 mg/mL infusion 75.7 mg (75.7 mg Intravenous New Bag/Given 12/28/18 2102)    Followed by  0.9 %  sodium chloride infusion (50 mLs Intravenous New Bag/Given 12/28/18 2200)  iohexol (OMNIPAQUE) 350 MG/ML injection 100 mL (100 mLs Intravenous Contrast Given 12/28/18 2047)  clevidipine (CLEVIPREX) 0.5 MG/ML infusion (  Duplicate 2/70/35 0093)  propofol  (DIPRIVAN) 1000 GH/829HB infusion (  Duplicate 03/16/95 7893)  etomidate (AMIDATE) injection (20 mg Intravenous Given 12/28/18 2113)  rocuronium [facility]) injection (100 mg Intravenous Given 12/28/18 2113)     Initial Impression / Assessment and Plan / ED Course  I have reviewed the triage vital signs and the nursing notes.  Pertinent labs & imaging results that were available during my care of the patient were reviewed by me and considered in my medical decision making (see chart for details).        78 yo M with a chief complaint of acute onset strokelike symptoms.  Patient arrived with left eye deviation and left-sided flaccidity.  He was nonresponsive to pain.  I commented him to a CT scan with concern for his airway.  A CT of the head without an obvious acute bleed.  He was taken back to the recess bay and was intubated for respiratory protection.  Was given TPA for possible acute stroke.  ICU admit.  CRITICAL CARE Performed by: Cecilio Asper   Total critical care time: 80 minutes  Critical care time was exclusive of separately billable procedures and treating other patients.  Critical care was necessary to treat or prevent imminent or life-threatening deterioration.  Critical care was time spent personally by me on the following activities: development of treatment plan with patient and/or surrogate as well as nursing, discussions with consultants, evaluation of patient's response to treatment, examination of patient, obtaining history from patient or surrogate, ordering and performing treatments and interventions, ordering and review of laboratory studies, ordering and review of radiographic studies, pulse oximetry and re-evaluation of patient's condition.  The patients results and plan were reviewed and discussed.   Any x-rays performed were independently reviewed by myself.   Differential diagnosis were considered with the presenting HPI.  Medications  LORazepam  (ATIVAN) 2 MG/ML injection (has no administration in time range)   stroke: mapping our early stages of recovery book (has no administration in time range)  0.9 %  sodium chloride infusion (50 mL/hr Intravenous New Bag/Given 12/28/18 2239)  acetaminophen (TYLENOL) tablet 650 mg (has no administration in time range)    Or  acetaminophen (TYLENOL) solution 650 mg (has no administration in time range)    Or  acetaminophen (TYLENOL) suppository 650 mg (has no administration in time range)  labetalol (NORMODYNE) injection 10 mg (has no administration in time range)    And  nicardipine (CARDENE) 83m in 0.86% saline 2061mIV infusion (0.1 mg/ml) (5 mg/hr Intravenous New Bag/Given 12/28/18 2257)  pantoprazole (PROTONIX) injection 40 mg (has no administration in time range)  fentaNYL (SUBLIMAZE) injection 50 mcg (has no administration in time  range)  fentaNYL (SUBLIMAZE) injection 50 mcg (50 mcg Intravenous Given 12/28/18 2313)  midazolam (VERSED) injection 1 mg (has no administration in time range)  midazolam (VERSED) injection 1 mg (has no administration in time range)  dextrose 5 %-0.45 % sodium chloride infusion (has no administration in time range)  insulin regular, human (MYXREDLIN) 100 units/ 100 mL infusion (4.5 Units/hr Intravenous New Bag/Given 12/28/18 2324)  chlorhexidine gluconate (MEDLINE KIT) (PERIDEX) 0.12 % solution 15 mL (15 mLs Mouth Rinse Given 12/28/18 2315)  MEDLINE mouth rinse (15 mLs Mouth Rinse Given 12/28/18 2347)  fentaNYL (SUBLIMAZE) injection 25 mcg (has no administration in time range)  propofol (DIPRIVAN) 1000 MG/100ML infusion (30 mcg/kg/min  84.1 kg Intravenous New Bag/Given 12/28/18 2310)  docusate (COLACE) 50 MG/5ML liquid 100 mg (has no administration in time range)  bisacodyl (DULCOLAX) suppository 10 mg (has no administration in time range)  LORazepam (ATIVAN) injection 2 mg (2 mg Intravenous Given 12/28/18 2043)  alteplase (ACTIVASE) 1 mg/mL infusion 75.7 mg (75.7 mg  Intravenous New Bag/Given 12/28/18 2102)    Followed by  0.9 %  sodium chloride infusion (50 mLs Intravenous New Bag/Given 12/28/18 2200)  iohexol (OMNIPAQUE) 350 MG/ML injection 100 mL (100 mLs Intravenous Contrast Given 12/28/18 2047)  clevidipine (CLEVIPREX) 0.5 MG/ML infusion (  Duplicate 0/73/71 0626)  propofol (DIPRIVAN) 1000 RS/854OE infusion (  Duplicate 03/02/49 0938)  etomidate (AMIDATE) injection (20 mg Intravenous Given 12/28/18 2113)  rocuronium (ZEMURON) injection (100 mg Intravenous Given 12/28/18 2113)    Vitals:   12/28/18 2215 12/28/18 2230 12/28/18 2310 12/28/18 2330  BP: 126/86 (!) 160/141    Pulse: 97 91 96   Resp: '14 17 20   ' Temp:    98.4 F (36.9 C)  TempSrc:    Oral  SpO2: 98% 98% 97%   Weight:      Height:        Final diagnoses:  Stupor    Admission/ observation were discussed with the admitting physician, patient and/or family and they are comfortable with the plan.   Final Clinical Impressions(s) / ED Diagnoses   Final diagnoses:  Stupor    ED Discharge Orders    None       Deno Etienne, DO 12/28/18 2351

## 2018-12-29 ENCOUNTER — Inpatient Hospital Stay (HOSPITAL_COMMUNITY): Payer: Medicare HMO

## 2018-12-29 DIAGNOSIS — J9600 Acute respiratory failure, unspecified whether with hypoxia or hypercapnia: Secondary | ICD-10-CM

## 2018-12-29 DIAGNOSIS — E1029 Type 1 diabetes mellitus with other diabetic kidney complication: Secondary | ICD-10-CM

## 2018-12-29 DIAGNOSIS — E1065 Type 1 diabetes mellitus with hyperglycemia: Secondary | ICD-10-CM

## 2018-12-29 DIAGNOSIS — I639 Cerebral infarction, unspecified: Secondary | ICD-10-CM

## 2018-12-29 LAB — GLUCOSE, CAPILLARY
Glucose-Capillary: 100 mg/dL — ABNORMAL HIGH (ref 70–99)
Glucose-Capillary: 106 mg/dL — ABNORMAL HIGH (ref 70–99)
Glucose-Capillary: 111 mg/dL — ABNORMAL HIGH (ref 70–99)
Glucose-Capillary: 115 mg/dL — ABNORMAL HIGH (ref 70–99)
Glucose-Capillary: 131 mg/dL — ABNORMAL HIGH (ref 70–99)
Glucose-Capillary: 141 mg/dL — ABNORMAL HIGH (ref 70–99)
Glucose-Capillary: 217 mg/dL — ABNORMAL HIGH (ref 70–99)
Glucose-Capillary: 239 mg/dL — ABNORMAL HIGH (ref 70–99)
Glucose-Capillary: 261 mg/dL — ABNORMAL HIGH (ref 70–99)
Glucose-Capillary: 309 mg/dL — ABNORMAL HIGH (ref 70–99)
Glucose-Capillary: 358 mg/dL — ABNORMAL HIGH (ref 70–99)
Glucose-Capillary: 395 mg/dL — ABNORMAL HIGH (ref 70–99)
Glucose-Capillary: 469 mg/dL — ABNORMAL HIGH (ref 70–99)
Glucose-Capillary: 498 mg/dL — ABNORMAL HIGH (ref 70–99)
Glucose-Capillary: 513 mg/dL (ref 70–99)
Glucose-Capillary: 66 mg/dL — ABNORMAL LOW (ref 70–99)
Glucose-Capillary: 71 mg/dL (ref 70–99)
Glucose-Capillary: 84 mg/dL (ref 70–99)
Glucose-Capillary: >600 mg/dL (ref 70–99)

## 2018-12-29 LAB — TROPONIN I: Troponin I: <0.03 ng/mL (ref <0.03)

## 2018-12-29 LAB — ECHOCARDIOGRAM COMPLETE
Height: 73 in
Weight: 2910.07 oz

## 2018-12-29 LAB — MRSA PCR SCREENING: MRSA by PCR: NEGATIVE

## 2018-12-29 MED ORDER — INSULIN ASPART 100 UNIT/ML ~~LOC~~ SOLN
3.0000 [IU] | SUBCUTANEOUS | Status: DC
  Administered 2018-12-29: 17:00:00 3 [IU] via SUBCUTANEOUS
  Administered 2018-12-29 (×2): 9 [IU] via SUBCUTANEOUS
  Administered 2018-12-30: 04:00:00 6 [IU] via SUBCUTANEOUS
  Administered 2018-12-30 – 2018-12-31 (×3): 3 [IU] via SUBCUTANEOUS
  Administered 2018-12-31: 9 [IU] via SUBCUTANEOUS

## 2018-12-29 MED ORDER — SODIUM CHLORIDE 0.9 % IV BOLUS: 500.0000 mL | Freq: Once | INTRAVENOUS | Status: DC

## 2018-12-29 MED ORDER — DEXTROSE 50 % IV SOLN
INTRAVENOUS | Status: AC
  Administered 2018-12-29: 13:00:00 50 mL
  Filled 2018-12-29: qty 50

## 2018-12-29 MED ORDER — ADULT MULTIVITAMIN W/MINERALS CH
1.0000 | ORAL_TABLET | Freq: Every day | ORAL | Status: DC
  Administered 2018-12-29 – 2019-01-03 (×5): 1
  Filled 2018-12-29 (×6): qty 1

## 2018-12-29 MED ORDER — ATROPINE SULFATE 1 MG/10ML IJ SOSY
PREFILLED_SYRINGE | INTRAMUSCULAR | Status: AC
  Filled 2018-12-29: qty 10

## 2018-12-29 MED ORDER — DOPAMINE-DEXTROSE 3.2-5 MG/ML-% IV SOLN
0.0000 ug/kg/min | INTRAVENOUS | Status: DC
  Administered 2018-12-29: 2.5 ug/kg/min via INTRAVENOUS

## 2018-12-29 MED ORDER — HALOPERIDOL LACTATE 5 MG/ML IJ SOLN
1.0000 mg | INTRAMUSCULAR | Status: DC | PRN
  Administered 2018-12-29 – 2019-01-01 (×2): 4 mg via INTRAVENOUS
  Filled 2018-12-29 (×3): qty 1

## 2018-12-29 MED ORDER — INSULIN DETEMIR 100 UNIT/ML ~~LOC~~ SOLN
8.0000 [IU] | Freq: Two times a day (BID) | SUBCUTANEOUS | Status: DC
  Administered 2018-12-29 – 2018-12-31 (×6): 8 [IU] via SUBCUTANEOUS
  Filled 2018-12-29 (×7): qty 0.08

## 2018-12-29 MED ORDER — SODIUM CHLORIDE 0.9 % IV SOLN
INTRAVENOUS | Status: AC
  Administered 2018-12-29: 07:00:00 via INTRAVENOUS

## 2018-12-29 MED ORDER — LEVETIRACETAM IN NACL 1000 MG/100ML IV SOLN
1000.0000 mg | Freq: Once | INTRAVENOUS | Status: AC
  Administered 2018-12-29: 03:00:00 1000 mg via INTRAVENOUS
  Filled 2018-12-29: qty 100

## 2018-12-29 MED ORDER — VITAL AF 1.2 CAL PO LIQD
1000.0000 mL | ORAL | Status: DC
  Administered 2018-12-29: 1000 mL

## 2018-12-29 MED ORDER — DEXMEDETOMIDINE HCL IN NACL 400 MCG/100ML IV SOLN
0.4000 ug/kg/h | INTRAVENOUS | Status: DC
  Administered 2018-12-29 (×2): 0.5 ug/kg/h via INTRAVENOUS
  Filled 2018-12-29 (×3): qty 100

## 2018-12-29 MED ORDER — VITAL HIGH PROTEIN PO LIQD: 1000.0000 mL | ORAL | Status: DC

## 2018-12-29 MED ORDER — FENTANYL 2500MCG IN NS 250ML (10MCG/ML) PREMIX INFUSION
0.0000 ug/h | INTRAVENOUS | Status: DC
  Administered 2018-12-29: 06:00:00 25 ug/h via INTRAVENOUS
  Administered 2018-12-29: 11:00:00 50 ug/h via INTRAVENOUS
  Filled 2018-12-29: qty 250

## 2018-12-29 MED ORDER — HALOPERIDOL LACTATE 5 MG/ML IJ SOLN
4.0000 mg | Freq: Once | INTRAMUSCULAR | Status: AC
  Administered 2018-12-29: 11:00:00 4 mg via INTRAVENOUS

## 2018-12-29 MED ORDER — INSULIN REGULAR(HUMAN) IN NACL 100-0.9 UT/100ML-% IV SOLN
INTRAVENOUS | Status: DC
  Administered 2018-12-29: 11:00:00 0.5 [IU]/h via INTRAVENOUS
  Filled 2018-12-29 (×2): qty 100

## 2018-12-29 MED ORDER — DOPAMINE-DEXTROSE 3.2-5 MG/ML-% IV SOLN
INTRAVENOUS | Status: AC
  Filled 2018-12-29: qty 250

## 2018-12-29 NOTE — Progress Notes (Signed)
OT Cancellation Note  Patient Details Name: Eric Wade MRN: [id number] DOB: [birth year]   Cancelled Treatment:    Reason Eval/Treat Not Completed: Active bedrest order;Patient not medically ready. Will check back.  Lucille Passy, OTR/L Acute Rehabilitation Services Pager [phone] Office [phone]   Lucille Passy M 12/29/2018, 10:22 AM

## 2018-12-29 NOTE — Progress Notes (Addendum)
Pulled versed 5mg  from pyxis and wasted with RN Evan around 545am. Versed never given d/t hypotension, unable to return in pyxis. Versed placed in sharps witnessed by [employer].

## 2018-12-29 NOTE — Procedures (Signed)
History: 78 year old male who presented with left-sided weakness  Sedation: None  Technique: This is a 21 channel routine scalp EEG performed at the bedside with bipolar and monopolar montages arranged in accordance to the international 10/20 system of electrode placement. One channel was dedicated to EKG recording.    Background: There is an asymmetric background with a posterior dominant rhythm of 8-9 Hz that though it is seen bilaterally appears attenuated on the right compared to the left.  There is also focal irregular 3 to 4 Hz activity most prominent in the right frontal region(Fp2, F4).  There is also generalized irregular delta and theta activity intruding into the background.  Photic stimulation: Physiologic driving is not performed  EEG Abnormalities: 1) Right frontal slow activity 2) attenuation of faster frequencies on the right.   Clinical Interpretation: This EEG is consistent with a nonspecific right hemispheric dysfunction.  This is a nonspecific finding, and can be seen in stroke or postictal state, among other causes.   There was no seizure or definite seizure predisposition recorded on this study. Please note that lack of epileptiform activity on EEG does not preclude the possibility of epilepsy.   Roland Rack, MD [facility] [phone]  If 7pm- 7am, please page neurology on call as listed in [place].

## 2018-12-29 NOTE — Progress Notes (Signed)
SLP Cancellation Note  Patient Details Name: Eric Wade MRN: [id number] DOB: 04-Feb-1941   Cancelled treatment:       Reason Eval/Treat Not Completed: Patient not medically ready   Laquanta Hummel, Katherene Ponto 12/29/2018, 8:25 AM

## 2018-12-29 NOTE — Progress Notes (Signed)
eLink Physician-Brief Progress Note Patient Name: Eric Wade DOB: [birth year] MRN: [id number]   Date of Service  12/29/2018  HPI/Events of Note  Patient with persistent hypotension  eICU Interventions  500 ml iv normal saline bolus x 1        Damain Broadus U Malissa Slay 12/29/2018, 6:53 AM

## 2018-12-29 NOTE — Progress Notes (Addendum)
Initial Nutrition Assessment RD working remotely.  DOCUMENTATION CODES:   Not applicable  INTERVENTION:    Vital AF 1.2 at 20 ml/h, when able, advance to goal rate of 60 ml/h (1440 ml per day)   Provides 1728 kcal, 108 gm protein, 1168 ml free water daily  NUTRITION DIAGNOSIS:   Inadequate oral intake related to inability to eat as evidenced by NPO status.  GOAL:   Patient will meet greater than or equal to 90% of their needs  MONITOR:   Vent status, TF tolerance, Labs, Skin, I & O's  REASON FOR ASSESSMENT:   Ventilator, Consult Enteral/tube feeding initiation and management  ASSESSMENT:   78 yo male with PMH of DM-2, HLD, HTN, renal insufficiency, and prostate cancer who was admitted with AMS, glucose 706, code stroke. S/P TPA. Required intubation. CT head with no acute findings. MRI showed chronic small vessel disease with chronic lacunar infarcts.   Received MD Consult for TF initiation and management. OGT in place.  Patient is currently intubated on ventilator support MV: 6.6 L/min Temp (24hrs), Avg:98 F (36.7 C), Min:97.5 F (36.4 C), Max:98.4 F (36.9 C)   Labs reviewed. Sodium 131 (L) CBG's: [id number]  Medications reviewed and include Novolog, Levemir, Dopamine, IV insulin.  NUTRITION - FOCUSED PHYSICAL EXAM:  unable to complete  Diet Order:   Diet Order            Diet NPO time specified  Diet effective now              EDUCATION NEEDS:   No education needs have been identified at this time  Skin:  Skin Assessment: Reviewed RN Assessment(MASD to buttocks)  Last BM:  no BM documented  Height:   Ht Readings from Last 1 Encounters:  12/28/18 6\' 1"  (1.854 m)    Weight:   Wt Readings from Last 1 Encounters:  12/28/18 82.5 kg    Ideal Body Weight:  83.6 kg  BMI:  Body mass index is 24 kg/m.  Estimated Nutritional Needs:   Kcal:  1700  Protein:  100-120 gm  Fluid:  2 L    Molli Barrows, RD, LDN,  Agency Pager [phone] After Hours Pager [phone]

## 2018-12-29 NOTE — Progress Notes (Addendum)
NP requested RN to attempt to draw osmolality off IV line d/t pt being post TPA. 2 RN's attempted to draw blood on both PIV's  without any success.

## 2018-12-29 NOTE — Progress Notes (Addendum)
eLink Physician-Brief Progress Note Patient Name: Eric Wade DOB: February 12, 1941 MRN: [id number]   Date of Service  12/29/2018  HPI/Events of Note  Patient developed bradycardia and hypotension on Propofol. He is agitated off Propofol.  eICU Interventions  Discontinue Propofol, Dopamine infusion at a fixed rate of 2.5 mcg to address hypotension and bradycardia, Fentanyl infusion started for sedation. Dopamine increased to a fixed dose of 5 mcg for persistent hypotension. Will consider a fluid bolus if BP remains low on Dopamine 5 mcg.        [name] [name] 12/29/2018, 5:28 AM

## 2018-12-29 NOTE — Procedures (Signed)
Intubation Procedure Note BRANCE DARTT [id number] 1940/12/21  Procedure: Intubation Indications: Airway protection and maintenance  Procedure Details Consent: Unable to obtain consent because of altered level of consciousness. Time Out: Verified patient identification, verified procedure, site/side was marked, verified correct patient position, special equipment/implants available, medications/allergies/relevent history reviewed, required imaging and test results available.  Performed  Maximum sterile technique was used including cap, gloves, gown, hand hygiene and mask.  MAC and 4    Evaluation Hemodynamic Status: BP stable throughout; O2 sats: stable throughout Patient's Current Condition: stable Complications: No apparent complications Patient did tolerate procedure well. Chest X-ray ordered to verify placement.  CXR: pending.   Myrtie Neither 12/29/2018

## 2018-12-29 NOTE — Progress Notes (Signed)
PT Cancellation Note  Patient Details Name: [name] MRN: [id number] DOB: 03-Dec-1940   Cancelled Treatment:    Reason Eval/Treat Not Completed: Patient not medically ready. Pt intubated and with strict bedrest orders. Will monitor for readiness.   Shary Decamp [facility] 12/29/2018, 8:37 AM [place] Pager [phone] Office [phone]

## 2018-12-29 NOTE — Progress Notes (Signed)
NAME:  Eric Wade, MRN:  [id number], DOB:  10-14-1940,  LOS: 1   ADMISSION DATE:  12/28/2018,  CONSULTATION DATE:  12/28/2018 REFERRING MD:  Dr. Leonel Ramsay CHIEF COMPLAINT:  Altered mental status  Brief History   32 yoM presenting from home LSW 1700, with left gaze and left hemiplegia,hypertensive, and CBG high.  CTH negative.  TPA given and intubated for airway protection.   History of present illness   HPI obtained from medical chart review as patient is intubated on mechanical ventilation.  78 year old male with prior history of renal insufficiency, hypertension, hyperlipidemia, and DMT2 presenting from home with altered mental status.  Patient had been in his normal status of health, picking his wife up from dialysis, when he was found with altered mental status on bathroom floor.  LSW was 1800.  Presented as code stroke with EMS.  On arrival to ER around 2030, patient was flaccid on left with left gaze. He was afebrile, hypertensive with SBP ~210 and given labetalol with improvement.  CBG noted high.  Neurology present for code stroke.  Labs noted for corrected Na 140, glucose 706, CO2 24, sCr 2.04, AG normal, normal CBC and coags, ETOH neg, UDS pending.  Given concern for possible seizure, was given ativan 2 mg.  Taken to CT which did not show any acute findings.  TPA was then given.  CTA of head and neck was non acute.  He subsequently required intubation for airway protection in ER.  CXR pending. To be admitted by Neurology, PCCM consulted for ventilator management.   Past Medical History   renal insufficiency, HTN, HLD, DMT2, obesity, prostate cancer, ED, gout  Significant Hospital Events   4/29 Admitted  4/30 brady /hypotension with propofol >> fent gtt, dopamine  Consults:   Procedures:  4/29 ETT >>  Significant Diagnostic Tests:  4/29 CTH >> No acute finding by CT. Atrophy and chronic small-vessel ischemic Changes.  ASPECTS is 10  4/29 CTA head/ neck/ perfusion  >> No large or medium vessel occlusion.  Negative perfusion study.  No carotid bifurcation stenosis.  No posterior circulation stenosis.  Distal vessel atherosclerotic irregularity.  4/30 MRI >> Chronic small vessel disease including chronic appearing lacunar infarcts in pons, deep gray matter, and a few scattered chronic microhemorrhages.  Micro Data:   Antimicrobials:   Interim history/subjective:   Critically ill, intubated Hypotensive and bradycardic on low-dose dopamine Good urine output  Objective   Blood pressure (!) 157/72, pulse (!) 55, temperature 97.6 F (36.4 C), temperature source Oral, resp. rate 16, height 6\' 1"  (1.854 m), weight 82.5 kg, SpO2 100 %.    Vent Mode: PRVC FiO2 (%):  [40 %-100 %] 40 % Set Rate:  [16 bmp] 16 bmp Vt Set:  [630 mL] 630 mL PEEP:  [5 cmH20] 5 cmH20 Plateau Pressure:  [14 cmH20-18 cmH20] 18 cmH20   Intake/Output Summary (Last 24 hours) at 12/29/2018 0941 Last data filed at 12/29/2018 0800 Gross per 24 hour  Intake 1209.77 ml  Output 1125 ml  Net 84.77 ml   Filed Weights   12/28/18 2000 12/28/18 2200  Weight: 84.1 kg 82.5 kg   Examination: Elderly man, intubated, in no distress Mild pallor, no icterus, no JVD When fentanyl turned off, localizes on right, decreased movement with left arm, does not follow commands Clear breath sounds bilateral S1-S2 bradycardia, regular Soft and nontender abdomen No edema  Chest x-ray 4/30 personally reviewed which shows ET tube in good position and left lower  lobe atelectasis  No labs from 4/30  Resolved Hospital Problem list    Assessment & Plan:  Acute encephalopathy- w/ left gaze, left hemiplegia - ddx include stroke, toxic/ metabolic - hyperglycemia, EEG neg, no signs of infection, UDS neg - s/p TPA  P:  Per Neurology PAD protocol with intermittent fentanyl, goal RA SS 0, did not tolerate propofol and doubt will tolerate Precedex due to bradycardia, will use Haldol as  needed  Acute respiratory failure-intubated for airway protection P:  Tenuous breathing trials with goal extubation if mental status improves PPI Daily SBT    Hypertensive emergency- resolved - s/p labetalol Now hypotensive and bradycardic on dopamine drip P: Tele monitoring  Goal SBP <180; MAP >65 Taper off dopamine, maintain SBP 120 Echo pending  amlodipin, Cardizem and lisinopril on hold   AKI - baseline 1.5 in 08/2017  Corrected Na 140 P:  Trend BMP / urinary output Replace electrolytes as indicated Avoid nephrotoxic agents, ensure adequate renal perfusion  Uncontrolled diabetes type 2, insulin requiring P:  Insulin gtt per protocol Hourly CBGs   Best practice:  Diet: NPO Pain/Anxiety/Delirium protocol (if indicated): prn fentanyl, propofol if needed VAP protocol (if indicated): yes DVT prophylaxis: s/p TPA GI prophylaxis: PPI Glucose control: hourly CBGs/ insulin gtt Mobility: BR Code Status: Full  Family Communication: per Neuro Disposition: ICU  Summary-no clear cause for left hemiplegia and altered mental status found, could still be related to lacunar stroke in pons or metabolic from hyperglycemia Since he came in hypertensive and then now seems to have bottomed out will maintain systolic blood pressure above 120 for next 24 hours  The patient is critically ill with multiple organ systems failure and requires high complexity decision making for assessment and support, frequent evaluation and titration of therapies, application of advanced monitoring technologies and extensive interpretation of multiple databases. Critical Care Time devoted to patient care services described in this note independent of APP/resident  time is 35 minutes.   Kara Mead MD. Shade Flood. [place] [facility] Pager [phone] If no response call [phone]    12/29/2018, 9:41 AM

## 2018-12-29 NOTE — Progress Notes (Signed)
RT Note:  Transported patient to and from MRI without any complications.  Suctioned patient before and after as well.  Vital signs stable.

## 2018-12-29 NOTE — Progress Notes (Signed)
Inpatient Diabetes Program Recommendations  AACE/ADA: New Consensus Statement on Inpatient Glycemic Control (2015)  Target Ranges:  Prepandial:   less than 140 mg/dL      Peak postprandial:   less than 180 mg/dL (1-2 hours)      Critically ill patients:  140 - 180 mg/dL   Lab Results  Component Value Date   GLUCAP 261 (H) 12/29/2018   HGBA1C 7.0 (H) 09/14/2017    Review of Glycemic Control Results for Eric Wade, Eric Wade (MRN [id number]) as of 12/29/2018 09:13  Ref. Range 12/28/2018 20:57 12/28/2018 23:21 12/29/2018 00:26 12/29/2018 02:20 12/29/2018 03:41 12/29/2018 04:42 12/29/2018 05:47 12/29/2018 06:51 12/29/2018 07:48  Glucose-Capillary Latest Ref Range: 70 - 99 mg/dL >600 (HH) 513 (HH) >600 (HH) 498 (H) 469 (H) 395 (H) 358 (H) 239 (H) 261 (H)   Diabetes history: DM 2 Outpatient Diabetes medications:  Lantus 40 units q HS, Actos 30 mg daily Current orders for Inpatient glycemic control:  IV insulin/ DKA ED orders Inpatient Diabetes Program Recommendations:    Consider changing IV insulin order set to "ICU Glycemic control" order set- Phase 2.  Once patient has 6 blood sugars less than 180 mg/dL, may be eligible for transition to SQ insulin per protocol.   Thanks,  Adah Perl, RN, BC-ADM Inpatient Diabetes Coordinator Pager [phone] (8a-5p)

## 2018-12-29 NOTE — Progress Notes (Signed)
  Echocardiogram 2D Echocardiogram has been performed.  Randa Lynn Jem Castro 12/29/2018, 3:22 PM

## 2018-12-29 NOTE — Progress Notes (Signed)
STROKE TEAM PROGRESS NOTE   INTERVAL HISTORY I have personally reviewed history of presenting illness and electronic medical records as well as imaging films.  Patient presented with unresponsiveness left hemiplegia and left gaze deviation.  It was unclear whether he had a seizure with postictal deficits or an acute stroke.  After he failed to improve with Ativan he was given IV TPA but subsequently required intubation for airway protection.  He became hypotensive and bradycardic on propofol and fentanyl requiring dopamine to be started. he seems to be improving but it appears to be encephalopathic and is moving all 4 extremities this morning left side perhaps slightly less than right but he is not following commands.  His blood pressure has been adequately controlled.  He has had no witnessed seizures.  Vitals:   12/29/18 0945 12/29/18 0950 12/29/18 0955 12/29/18 1000  BP: 132/67 (!) 127/58 127/63   Pulse: (!) 55 (!) 51 (!) 55 (!) 54  Resp: 17 16 16 16   Temp:      TempSrc:      SpO2: 99% 100% 99% 100%  Weight:      Height:        CBC:  Recent Labs  Lab 12/28/18 2031 12/28/18 2142  WBC 7.6  --   NEUTROABS 3.7  --   HGB 15.7 14.6  HCT 47.6 43.0  MCV 90.8  --   PLT 167  --     Basic Metabolic Panel:  Recent Labs  Lab 12/28/18 2031 12/28/18 2037 12/28/18 2142  NA 130*  --  131*  K 4.0  --  3.8  CL 93*  --   --   CO2 24  --   --   GLUCOSE 706*  --   --   BUN 23  --   --   CREATININE 2.04* 1.90*  --   CALCIUM 9.3  --   --    Lipid Panel:     Component Value Date/Time   CHOL 149 09/14/2017 1439   TRIG 164.0 (H) 09/14/2017 1439   HDL 55.70 09/14/2017 1439   CHOLHDL 3 09/14/2017 1439   VLDL 32.8 09/14/2017 1439   LDLCALC 61 09/14/2017 1439   HgbA1c:  Lab Results  Component Value Date   HGBA1C 7.0 (H) 09/14/2017   Urine Drug Screen:     Component Value Date/Time   LABOPIA NONE DETECTED 12/28/2018 2252   COCAINSCRNUR NONE DETECTED 12/28/2018 2252   LABBENZ NONE  DETECTED 12/28/2018 2252   AMPHETMU NONE DETECTED 12/28/2018 2252   THCU NONE DETECTED 12/28/2018 2252   LABBARB NONE DETECTED 12/28/2018 2252    Alcohol Level     Component Value Date/Time   ETH <10 12/28/2018 2031    IMAGING Ct Angio Head W Or Wo Contrast  Result Date: 12/28/2018 CLINICAL DATA:  Acute presentation with confusion and left-sided weakness. EXAM: CT ANGIOGRAPHY HEAD AND NECK CT PERFUSION BRAIN TECHNIQUE: Multidetector CT imaging of the head and neck was performed using the standard protocol during bolus administration of intravenous contrast. Multiplanar CT image reconstructions and MIPs were obtained to evaluate the vascular anatomy. Carotid stenosis measurements (when applicable) are obtained utilizing NASCET criteria, using the distal internal carotid diameter as the denominator. Multiphase CT imaging of the brain was performed following IV bolus contrast injection. Subsequent parametric perfusion maps were calculated using RAPID software. CONTRAST:  128mL OMNIPAQUE IOHEXOL 350 MG/ML SOLN COMPARISON:  Head CT same day FINDINGS: CTA NECK FINDINGS Aortic arch: Atherosclerotic calcification. No aneurysm or dissection. Branching  pattern is normal. Right carotid system: Common carotid artery widely patent to the bifurcation. Atherosclerotic calcification at the carotid bifurcation and ICA bulb but no stenosis. Cervical ICA widely patent. Left carotid system: Common carotid artery widely patent to the bifurcation. Calcified plaque at the bifurcation but no stenosis. Cervical ICA widely patent beyond that. Vertebral arteries: Both vertebral artery origins are widely patent. Both vertebral arteries appear patent and normal through the cervical region to the foramen magnum. Skeleton: Ordinary cervical spondylosis. Other neck: No mass or lymphadenopathy. Upper chest: Emphysema and pulmonary scarring.  No active process. Review of the MIP images confirms the above findings CTA HEAD FINDINGS  Anterior circulation: Both internal carotid arteries are widely patent through the skull base and siphon regions. There is siphon calcification but no stenosis greater than 20%. The anterior and middle cerebral vessels are patent without proximal stenosis, aneurysm or vascular malformation. No occluded large or medium vessels are identified. Distal vessels do show some atherosclerotic irregularity. Posterior circulation: Both vertebral arteries widely patent to the basilar. No basilar stenosis. Posterior circulation branch vessels are normal. Distal vessels do show some atherosclerotic irregularity. Venous sinuses: Patent and normal. Anatomic variants: None significant. Delayed phase: Not performed Review of the MIP images confirms the above findings CT Brain Perfusion Findings: ASPECTS: 10 CBF (<30%) Volume: 66mL Perfusion (Tmax>6.0s) volume: 23mL Mismatch Volume: 37mL Infarction Location:None IMPRESSION: No large or medium vessel occlusion.  Negative perfusion study. No carotid bifurcation stenosis.  No posterior circulation stenosis. Distal vessel atherosclerotic irregularity. Electronically Signed   By: Nelson Chimes M.D.   On: 12/28/2018 21:07   Ct Angio Neck W Or Wo Contrast  Result Date: 12/28/2018 CLINICAL DATA:  Acute presentation with confusion and left-sided weakness. EXAM: CT ANGIOGRAPHY HEAD AND NECK CT PERFUSION BRAIN TECHNIQUE: Multidetector CT imaging of the head and neck was performed using the standard protocol during bolus administration of intravenous contrast. Multiplanar CT image reconstructions and MIPs were obtained to evaluate the vascular anatomy. Carotid stenosis measurements (when applicable) are obtained utilizing NASCET criteria, using the distal internal carotid diameter as the denominator. Multiphase CT imaging of the brain was performed following IV bolus contrast injection. Subsequent parametric perfusion maps were calculated using RAPID software. CONTRAST:  153mL OMNIPAQUE IOHEXOL  350 MG/ML SOLN COMPARISON:  Head CT same day FINDINGS: CTA NECK FINDINGS Aortic arch: Atherosclerotic calcification. No aneurysm or dissection. Branching pattern is normal. Right carotid system: Common carotid artery widely patent to the bifurcation. Atherosclerotic calcification at the carotid bifurcation and ICA bulb but no stenosis. Cervical ICA widely patent. Left carotid system: Common carotid artery widely patent to the bifurcation. Calcified plaque at the bifurcation but no stenosis. Cervical ICA widely patent beyond that. Vertebral arteries: Both vertebral artery origins are widely patent. Both vertebral arteries appear patent and normal through the cervical region to the foramen magnum. Skeleton: Ordinary cervical spondylosis. Other neck: No mass or lymphadenopathy. Upper chest: Emphysema and pulmonary scarring.  No active process. Review of the MIP images confirms the above findings CTA HEAD FINDINGS Anterior circulation: Both internal carotid arteries are widely patent through the skull base and siphon regions. There is siphon calcification but no stenosis greater than 20%. The anterior and middle cerebral vessels are patent without proximal stenosis, aneurysm or vascular malformation. No occluded large or medium vessels are identified. Distal vessels do show some atherosclerotic irregularity. Posterior circulation: Both vertebral arteries widely patent to the basilar. No basilar stenosis. Posterior circulation branch vessels are normal. Distal vessels do show some atherosclerotic  irregularity. Venous sinuses: Patent and normal. Anatomic variants: None significant. Delayed phase: Not performed Review of the MIP images confirms the above findings CT Brain Perfusion Findings: ASPECTS: 10 CBF (<30%) Volume: 28mL Perfusion (Tmax>6.0s) volume: 73mL Mismatch Volume: 68mL Infarction Location:None IMPRESSION: No large or medium vessel occlusion.  Negative perfusion study. No carotid bifurcation stenosis.  No  posterior circulation stenosis. Distal vessel atherosclerotic irregularity. Electronically Signed   By: Nelson Chimes M.D.   On: 12/28/2018 21:07   Mr Brain Wo Contrast  Result Date: 12/29/2018 CLINICAL DATA:  78 year old male code stroke. Confusion and left side weakness. EXAM: MRI HEAD WITHOUT CONTRAST TECHNIQUE: Multiplanar, multiecho pulse sequences of the brain and surrounding structures were obtained without intravenous contrast. COMPARISON:  CTA head and neck, CT perfusion 12/28/2018. FINDINGS: Brain: No restricted diffusion to suggest acute infarction. No midline shift, mass effect, evidence of mass lesion, ventriculomegaly, extra-axial collection or acute intracranial hemorrhage. Cervicomedullary junction and pituitary are within normal limits. Patchy and confluent cerebral white matter T2 and FLAIR hyperintensity, mostly periventricular. Chronic microhemorrhage in the posterior right temporal lobe on series 14, image 32. Smaller chronic microhemorrhages at the posterior left operculum on image 31. Similar chronic microhemorrhage in the left occipital lobe on image 27. No cortical encephalomalacia identified. Mild T2 heterogeneity in the bilateral deep gray matter nuclei. More moderate T2 signal abnormality in the pons on series 10, image 9 resembles chronic lacunar infarct. Negative cerebellum. Vascular: Major intracranial vascular flow voids are grossly preserved; rapid T2 weighted imaging was performed. Skull and upper cervical spine: Partially visible degenerative cervical spinal stenosis. Normal bone marrow signal. Sinuses/Orbits: Negative orbits, postoperative changes to the right globe. Paranasal Visualized paranasal sinuses and mastoids are stable and well pneumatized. Other: Patient is intubated and fluid is layering in the pharynx. Scalp and face soft tissues appear negative. IMPRESSION: 1.  No acute intracranial abnormality. 2. Chronic small vessel disease including chronic appearing lacunar  infarcts in pons, deep gray matter, and a few scattered chronic microhemorrhages. Electronically Signed   By: Genevie Ann M.D.   On: 12/29/2018 02:17   Ct Cerebral Perfusion W Contrast  Result Date: 12/28/2018 CLINICAL DATA:  Acute presentation with confusion and left-sided weakness. EXAM: CT ANGIOGRAPHY HEAD AND NECK CT PERFUSION BRAIN TECHNIQUE: Multidetector CT imaging of the head and neck was performed using the standard protocol during bolus administration of intravenous contrast. Multiplanar CT image reconstructions and MIPs were obtained to evaluate the vascular anatomy. Carotid stenosis measurements (when applicable) are obtained utilizing NASCET criteria, using the distal internal carotid diameter as the denominator. Multiphase CT imaging of the brain was performed following IV bolus contrast injection. Subsequent parametric perfusion maps were calculated using RAPID software. CONTRAST:  123mL OMNIPAQUE IOHEXOL 350 MG/ML SOLN COMPARISON:  Head CT same day FINDINGS: CTA NECK FINDINGS Aortic arch: Atherosclerotic calcification. No aneurysm or dissection. Branching pattern is normal. Right carotid system: Common carotid artery widely patent to the bifurcation. Atherosclerotic calcification at the carotid bifurcation and ICA bulb but no stenosis. Cervical ICA widely patent. Left carotid system: Common carotid artery widely patent to the bifurcation. Calcified plaque at the bifurcation but no stenosis. Cervical ICA widely patent beyond that. Vertebral arteries: Both vertebral artery origins are widely patent. Both vertebral arteries appear patent and normal through the cervical region to the foramen magnum. Skeleton: Ordinary cervical spondylosis. Other neck: No mass or lymphadenopathy. Upper chest: Emphysema and pulmonary scarring.  No active process. Review of the MIP images confirms the above findings CTA HEAD FINDINGS  Anterior circulation: Both internal carotid arteries are widely patent through the skull  base and siphon regions. There is siphon calcification but no stenosis greater than 20%. The anterior and middle cerebral vessels are patent without proximal stenosis, aneurysm or vascular malformation. No occluded large or medium vessels are identified. Distal vessels do show some atherosclerotic irregularity. Posterior circulation: Both vertebral arteries widely patent to the basilar. No basilar stenosis. Posterior circulation branch vessels are normal. Distal vessels do show some atherosclerotic irregularity. Venous sinuses: Patent and normal. Anatomic variants: None significant. Delayed phase: Not performed Review of the MIP images confirms the above findings CT Brain Perfusion Findings: ASPECTS: 10 CBF (<30%) Volume: 56mL Perfusion (Tmax>6.0s) volume: 64mL Mismatch Volume: 77mL Infarction Location:None IMPRESSION: No large or medium vessel occlusion.  Negative perfusion study. No carotid bifurcation stenosis.  No posterior circulation stenosis. Distal vessel atherosclerotic irregularity. Electronically Signed   By: Nelson Chimes M.D.   On: 12/28/2018 21:07   Dg Chest Port 1 View  Result Date: 12/28/2018 CLINICAL DATA:  Acute stroke. Endotracheal tube nasogastric tube placement. EXAM: PORTABLE CHEST 1 VIEW COMPARISON:  None. FINDINGS: Endotracheal tube and nasogastric tube are seen in appropriate position. No evidence of pneumothorax. Atelectasis or consolidation is seen in the left lung base. Right lung is clear. IMPRESSION: 1. Endotracheal tube and nasogastric tube in appropriate position. 2. Left basilar atelectasis versus consolidation. Electronically Signed   By: Earle Gell M.D.   On: 12/28/2018 22:44   Ct Head Code Stroke Wo Contrast  Result Date: 12/28/2018 CLINICAL DATA:  Code stroke.  Confusion.  Left-sided weakness. EXAM: CT HEAD WITHOUT CONTRAST TECHNIQUE: Contiguous axial images were obtained from the base of the skull through the vertex without intravenous contrast. COMPARISON:  None. FINDINGS:  Brain: Generalized atrophy. Brainstem and cerebellum are unremarkable. Old appearing small vessel insults affecting the thalami, basal ganglia and hemispheric white matter. No sign of acute infarction, mass lesion, hemorrhage, hydrocephalus or extra-axial collection. Vascular: There is atherosclerotic calcification of the major vessels at the base of the brain. Skull: Negative Sinuses/Orbits: Previous sinus surgery. Mild chronic mucosal thickening. Other: None ASPECTS ([place] Stroke Program Early CT Score) - Ganglionic level infarction (caudate, lentiform nuclei, internal capsule, insula, M1-M3 cortex): 7 - Supraganglionic infarction (M4-M6 cortex): 3 Total score (0-10 with 10 being normal): 10 IMPRESSION: 1. No acute finding by CT. Atrophy and chronic small-vessel ischemic changes. 2. ASPECTS is 10. 3. These results were communicated to Dr. Leonel Ramsay at 8:49 pmon 4/29/2020by text page via the [facility] messaging system. Electronically Signed   By: Nelson Chimes M.D.   On: 12/28/2018 20:49   EEG  12/28/2018  1) Right frontal slow activity 2) attenuation of faster frequencies on the right.  Clinical Interpretation: This EEG is consistent with a nonspecific right hemispheric dysfunction.  This is a nonspecific finding, and can be seen in stroke or postictal state, among other causes.   PHYSICAL EXAM Elderly African-American male who is intubated but restless despite sedation. . Afebrile. Head is nontraumatic. Neck is supple without bruit.    Cardiac exam no murmur or gallop. Lungs are clear to auscultation. Distal pulses are well felt. Neurological Exam :  Patient is intubated and sedated.  He is restless and agitated and thrashing around and will not hold still.  Eyes are partially closed will not open eyes to command or follow gaze to command.  There is no forced eye gaze deviation.  Face appears symmetric tongue midline.  He is able to move all 4 extremities  against gravity with perhaps right side slightly  more than left.  There does not appear to be significant focal weakness. ASSESSMENT/PLAN Eric Wade is a 78 y.o. male with history of DB, HTN, HLD presenting with L sided weakness, altered mental status.  Due to concern for seizure, treated with Ativan prior to TPA with no resolution of symptoms.  Ongoing mental status decline led to intubation. Received IV TPA 12/28/2018 at 2102.  Strokelike symptoms status post TPA   Acute encephalopathy versus seizure and postictal Todd's paralysis   code Stroke CT head No acute stroke. Small vessel disease. Atrophy. ASPECTS 10.     CTA head & neck no LVO.  Distal vessel atherosclerosis.  CT perfusion negative  MRI no acute stroke.  Chronic small vessel disease.  Old lacunes in pontine, deep gray matter. Few scattered chronic microhemorrhages.  CT head 24h post tPA pending   2D Echo pending  EEG neg for sz  LDL pending  HgbA1c pending  UDS negative   SCDs for VTE prophylaxis  Diet-n.p.o.  aspirin 81 mg daily prior to admission, now on No antithrombotic as within 24 hours of TPA administration.  Plan to resume aspirin if 24-hour imaging negative for hemorrhage  Therapy recommendations: Pending  Disposition: Pending  Acute respiratory failure   Intubated in the ED   Became hypotensive and bradycardic on propofol, changed to fentanyl and placed on dopamine  CCM on board  Hypertensive Emergency, resolved Hypotension, bradycardia  Home meds: Norvasc 10, diltiazem XR 240, lisinopril 20, Lasix 40  Blood pressure control required prior to TPA administration  Now hypotensive and bradycardic on dopamine drip BP goal per post tPA protocol x 24h following tPA administration CCM goal SBP > 120 x 24h . Long-term BP goal normotensive  Hyperlipidemia  Home meds: No statin  LDL pending, goal < 70  Diabetes type II Uncontrolled  Glucose on admission 706  Home meds: Lantus 40, Actos 30  HgbA1c pending, goal < 7.0  On  IV insulin with goal to transition off  Diabetic RN following   CCM managing diabetes   Other Stroke Risk Factors  Advanced age  Former cigarette smoker  Other Active Problems  AKI on CKD stage III, baseline 1.5 in 08/2017. Cr 1.9  Hospital day # 1  I have personally obtained history,examined this patient, reviewed notes, independently viewed imaging studies, participated in medical decision making and plan of care.ROS completed by me personally and pertinent positives fully documented  I have made any additions or clarifications directly to the above note. . Patient was found unresponsive with left gaze deviation and left hemiplegia it was unclear whether he had a seizure with postictal deficits or stroke.  After he failed to improve with Ativan he was given IV TPA.  His current neurological exam appears to be more nonfocal but encephalopathic.  MRI does not show an acute stroke and EEG does not show seizure activity.  Recommend close neurological follow-up and strict blood pressure control as per post TPA protocol.  Extubate per CCM when ready.  Diabetes control as per CCM.  Discussed with Dr. Elsworth Soho pulmonary critical care medicine and answered questions This patient is critically ill and at significant risk of neurological worsening, death and care requires constant monitoring of vital signs, hemodynamics,respiratory and cardiac monitoring, extensive review of multiple databases, frequent neurological assessment, discussion with family, other specialists and medical decision making of high complexity.I have made any additions or clarifications directly to the above note.This critical  care time does not reflect procedure time, or teaching time or supervisory time of PA/NP/Med Resident etc but could involve care discussion time.  I spent 30 minutes of neurocritical care time  in the care of  this patient.    Antony Contras, MD Medical Director [facility] Pager:  [phone] 12/29/2018 2:07 PM   To contact Stroke Continuity provider, please refer to [email or web address] After hours, contact General Neurology

## 2018-12-29 NOTE — Progress Notes (Signed)
  Echocardiogram 2D Echocardiogram has been attempted. Nursed stated to reattempt after extubation this afternoon. Will reattempt later.  Eric Wade 12/29/2018, 10:32 AM

## 2018-12-30 DIAGNOSIS — G9340 Encephalopathy, unspecified: Secondary | ICD-10-CM

## 2018-12-30 LAB — RENAL FUNCTION PANEL
Albumin: 3.4 g/dL — ABNORMAL LOW (ref 3.5–5.0)
Anion gap: 12 (ref 5–15)
BUN: 24 mg/dL — ABNORMAL HIGH (ref 8–23)
CO2: 23 mmol/L (ref 22–32)
Calcium: 9.2 mg/dL (ref 8.9–10.3)
Chloride: 105 mmol/L (ref 98–111)
Creatinine, Ser: 1.82 mg/dL — ABNORMAL HIGH (ref 0.61–1.24)
GFR calc Af Amer: 40 mL/min — ABNORMAL LOW (ref >60)
GFR calc non Af Amer: 34 mL/min — ABNORMAL LOW (ref >60)
Glucose, Bld: 193 mg/dL — ABNORMAL HIGH (ref 70–99)
Phosphorus: 2.8 mg/dL (ref 2.5–4.6)
Potassium: 3.1 mmol/L — ABNORMAL LOW (ref 3.5–5.1)
Sodium: 140 mmol/L (ref 135–145)

## 2018-12-30 LAB — MAGNESIUM
Magnesium: 2 mg/dL (ref 1.7–2.4)
Magnesium: 2 mg/dL (ref 1.7–2.4)

## 2018-12-30 LAB — HEMOGLOBIN A1C
Hgb A1c MFr Bld: 13.6 % — ABNORMAL HIGH (ref 4.8–5.6)
Mean Plasma Glucose: 343.62 mg/dL

## 2018-12-30 LAB — GLUCOSE, CAPILLARY
Glucose-Capillary: 213 mg/dL — ABNORMAL HIGH (ref 70–99)
Glucose-Capillary: 166 mg/dL — ABNORMAL HIGH (ref 70–99)
Glucose-Capillary: 80 mg/dL (ref 70–99)
Glucose-Capillary: 83 mg/dL (ref 70–99)
Glucose-Capillary: 84 mg/dL (ref 70–99)
Glucose-Capillary: 121 mg/dL — ABNORMAL HIGH (ref 70–99)

## 2018-12-30 LAB — LIPID PANEL
Cholesterol: 179 mg/dL (ref 0–200)
HDL: 66 mg/dL (ref >40)
LDL Cholesterol: 101 mg/dL — ABNORMAL HIGH (ref 0–99)
Total CHOL/HDL Ratio: 2.7 RATIO
Triglycerides: 59 mg/dL (ref <150)
VLDL: 12 mg/dL (ref 0–40)

## 2018-12-30 LAB — PHOSPHORUS: Phosphorus: 3.2 mg/dL (ref 2.5–4.6)

## 2018-12-30 LAB — CBC
HCT: 43.3 % (ref 39.0–52.0)
Hemoglobin: 15.1 g/dL (ref 13.0–17.0)
MCH: 30.2 pg (ref 26.0–34.0)
MCHC: 34.9 g/dL (ref 30.0–36.0)
MCV: 86.6 fL (ref 80.0–100.0)
Platelets: 157 10*3/uL (ref 150–400)
RBC: 5 MIL/uL (ref 4.22–5.81)
RDW: 13.1 % (ref 11.5–15.5)
WBC: 10.5 10*3/uL (ref 4.0–10.5)
nRBC: 0 % (ref 0.0–0.2)

## 2018-12-30 LAB — OSMOLALITY: Osmolality: 300 mOsm/kg — ABNORMAL HIGH (ref 275–295)

## 2018-12-30 LAB — TROPONIN I: Troponin I: 0.06 ng/mL (ref <0.03)

## 2018-12-30 MED ORDER — DEXTROSE-NACL 5-0.45 % IV SOLN
INTRAVENOUS | Status: DC
  Administered 2018-12-30: 17:00:00 via INTRAVENOUS
  Administered 2018-12-31: 50 mL/h via INTRAVENOUS

## 2018-12-30 MED ORDER — SODIUM CHLORIDE 0.9% FLUSH: 10.0000 mL | INTRAVENOUS | Status: DC | PRN

## 2018-12-30 MED ORDER — DEXMEDETOMIDINE HCL IN NACL 400 MCG/100ML IV SOLN
0.4000 ug/kg/h | INTRAVENOUS | Status: DC
  Administered 2018-12-30: 21:00:00 0.5 ug/kg/h via INTRAVENOUS
  Administered 2018-12-30 (×2): 0.6 ug/kg/h via INTRAVENOUS
  Filled 2018-12-30 (×2): qty 100

## 2018-12-30 MED ORDER — HALOPERIDOL LACTATE 5 MG/ML IJ SOLN: 5.0000 mg | INTRAMUSCULAR | Status: DC

## 2018-12-30 MED ORDER — POTASSIUM CHLORIDE 10 MEQ/100ML IV SOLN
10.0000 meq | INTRAVENOUS | Status: AC
  Administered 2018-12-30 (×2): 10 meq via INTRAVENOUS
  Filled 2018-12-30 (×2): qty 100

## 2018-12-30 MED ORDER — CHLORHEXIDINE GLUCONATE 0.12 % MT SOLN
15.0000 mL | Freq: Two times a day (BID) | OROMUCOSAL | Status: DC
  Administered 2018-12-30 – 2019-01-02 (×6): 15 mL via OROMUCOSAL
  Filled 2018-12-30 (×5): qty 15

## 2018-12-30 MED ORDER — POTASSIUM CHLORIDE 10 MEQ/100ML IV SOLN
10.0000 meq | INTRAVENOUS | Status: AC
  Administered 2018-12-30 (×2): 10 meq via INTRAVENOUS
  Filled 2018-12-30 (×2): qty 100

## 2018-12-30 MED ORDER — ENOXAPARIN SODIUM 40 MG/0.4ML ~~LOC~~ SOLN
40.0000 mg | SUBCUTANEOUS | Status: DC
  Administered 2018-12-30 – 2019-01-03 (×5): 40 mg via SUBCUTANEOUS
  Filled 2018-12-30 (×5): qty 0.4

## 2018-12-30 MED ORDER — ORAL CARE MOUTH RINSE
15.0000 mL | Freq: Two times a day (BID) | OROMUCOSAL | Status: DC
  Administered 2018-12-31 – 2019-01-02 (×6): 15 mL via OROMUCOSAL

## 2018-12-30 MED ORDER — HALOPERIDOL LACTATE 5 MG/ML IJ SOLN: 5.0000 mg | Freq: Once | INTRAMUSCULAR | Status: DC

## 2018-12-30 MED ORDER — POTASSIUM CHLORIDE 20 MEQ/15ML (10%) PO SOLN: 30.0000 meq | ORAL | Status: DC

## 2018-12-30 NOTE — Progress Notes (Signed)
CRITICAL VALUE ALERT  Critical Value:  Trop I 0.06  Date & Time Notied:  12/30/2018 @ 1278  Provider Notified: Eric Form, NP  Orders Received/Actions taken: no new orders at this time

## 2018-12-30 NOTE — Evaluation (Signed)
SLP Cancellation Note  Patient Details Name: BJORN HALLAS MRN: [id number] DOB: 03-22-1941   Cancelled treatment:       Reason Eval/Treat Not Completed: Other (comment)(pt agitated, trying to get OOB, declines to participate in swallow eval, will continue efforts; RN aware)   Macario Golds 12/30/2018, 12:02 PM   Luanna Salk, [facility] SLP [place] Pager [phone] Office [phone]

## 2018-12-30 NOTE — Progress Notes (Signed)
STROKE TEAM PROGRESS NOTE   INTERVAL HISTORY Patient was extubated this morning and is breathing satisfactorily.  He remains encephalopathic and speaks with a soft hypophonic voice and only few words and short sentences.  He follows simple commands.  He is able to move all 4 extremities well against gravity.  EEG yesterday showed focal right hemispheric slowing but no definite epileptiform activity.  Follow-up CT scan was unremarkable without definite stroke or other abnormality  Vitals:   12/30/18 0736 12/30/18 0740 12/30/18 0800 12/30/18 0900  BP: (!) 184/68  (!) 151/82 (!) 120/57  Pulse: (!) 45  61 (!) 58  Resp: 16  19 16   Temp:  97.6 F (36.4 C)    TempSrc:  Oral    SpO2: 100%  100% 96%  Weight:      Height:        CBC:  Recent Labs  Lab 12/28/18 2031 12/28/18 2142 12/30/18 0217  WBC 7.6  --  10.5  NEUTROABS 3.7  --   --   HGB 15.7 14.6 15.1  HCT 47.6 43.0 43.3  MCV 90.8  --  86.6  PLT 167  --  850    Basic Metabolic Panel:  Recent Labs  Lab 12/28/18 2031 12/28/18 2037 12/28/18 2142 12/30/18 0217  NA 130*  --  131* 140  K 4.0  --  3.8 3.1*  CL 93*  --   --  105  CO2 24  --   --  23  GLUCOSE 706*  --   --  193*  BUN 23  --   --  24*  CREATININE 2.04* 1.90*  --  1.82*  CALCIUM 9.3  --   --  9.2  MG  --   --   --  2.0  PHOS  --   --   --  2.8   Lipid Panel:     Component Value Date/Time   CHOL 179 12/30/2018 0217   TRIG 59 12/30/2018 0217   HDL 66 12/30/2018 0217   CHOLHDL 2.7 12/30/2018 0217   VLDL 12 12/30/2018 0217   LDLCALC 101 (H) 12/30/2018 0217   HgbA1c:  Lab Results  Component Value Date   HGBA1C 13.6 (H) 12/30/2018   Urine Drug Screen:     Component Value Date/Time   LABOPIA NONE DETECTED 12/28/2018 2252   COCAINSCRNUR NONE DETECTED 12/28/2018 2252   LABBENZ NONE DETECTED 12/28/2018 2252   AMPHETMU NONE DETECTED 12/28/2018 2252   THCU NONE DETECTED 12/28/2018 2252   LABBARB NONE DETECTED 12/28/2018 2252    Alcohol Level      Component Value Date/Time   ETH <10 12/28/2018 2031    IMAGING Ct Angio Head W Or Wo Contrast  Result Date: 12/28/2018 CLINICAL DATA:  Acute presentation with confusion and left-sided weakness. EXAM: CT ANGIOGRAPHY HEAD AND NECK CT PERFUSION BRAIN TECHNIQUE: Multidetector CT imaging of the head and neck was performed using the standard protocol during bolus administration of intravenous contrast. Multiplanar CT image reconstructions and MIPs were obtained to evaluate the vascular anatomy. Carotid stenosis measurements (when applicable) are obtained utilizing NASCET criteria, using the distal internal carotid diameter as the denominator. Multiphase CT imaging of the brain was performed following IV bolus contrast injection. Subsequent parametric perfusion maps were calculated using RAPID software. CONTRAST:  156mL OMNIPAQUE IOHEXOL 350 MG/ML SOLN COMPARISON:  Head CT same day FINDINGS: CTA NECK FINDINGS Aortic arch: Atherosclerotic calcification. No aneurysm or dissection. Branching pattern is normal. Right carotid system: Common carotid artery widely  patent to the bifurcation. Atherosclerotic calcification at the carotid bifurcation and ICA bulb but no stenosis. Cervical ICA widely patent. Left carotid system: Common carotid artery widely patent to the bifurcation. Calcified plaque at the bifurcation but no stenosis. Cervical ICA widely patent beyond that. Vertebral arteries: Both vertebral artery origins are widely patent. Both vertebral arteries appear patent and normal through the cervical region to the foramen magnum. Skeleton: Ordinary cervical spondylosis. Other neck: No mass or lymphadenopathy. Upper chest: Emphysema and pulmonary scarring.  No active process. Review of the MIP images confirms the above findings CTA HEAD FINDINGS Anterior circulation: Both internal carotid arteries are widely patent through the skull base and siphon regions. There is siphon calcification but no stenosis greater than  20%. The anterior and middle cerebral vessels are patent without proximal stenosis, aneurysm or vascular malformation. No occluded large or medium vessels are identified. Distal vessels do show some atherosclerotic irregularity. Posterior circulation: Both vertebral arteries widely patent to the basilar. No basilar stenosis. Posterior circulation branch vessels are normal. Distal vessels do show some atherosclerotic irregularity. Venous sinuses: Patent and normal. Anatomic variants: None significant. Delayed phase: Not performed Review of the MIP images confirms the above findings CT Brain Perfusion Findings: ASPECTS: 10 CBF (<30%) Volume: 57mL Perfusion (Tmax>6.0s) volume: 65mL Mismatch Volume: 71mL Infarction Location:None IMPRESSION: No large or medium vessel occlusion.  Negative perfusion study. No carotid bifurcation stenosis.  No posterior circulation stenosis. Distal vessel atherosclerotic irregularity. Electronically Signed   By: Nelson Chimes M.D.   On: 12/28/2018 21:07   Ct Head Wo Contrast  Result Date: 12/29/2018 CLINICAL DATA:  Stroke follow-up EXAM: CT HEAD WITHOUT CONTRAST TECHNIQUE: Contiguous axial images were obtained from the base of the skull through the vertex without intravenous contrast. COMPARISON:  Head CT 12/28/2018 FINDINGS: Brain: There is no mass, hemorrhage or extra-axial collection. There is generalized atrophy without lobar predilection. Hypodensity of the white matter is most commonly associated with chronic microvascular disease. Vascular: No abnormal hyperdensity of the major intracranial arteries or dural venous sinuses. No intracranial atherosclerosis. Skull: The visualized skull base, calvarium and extracranial soft tissues are normal. Sinuses/Orbits: No fluid levels or advanced mucosal thickening of the visualized paranasal sinuses. No mastoid or middle ear effusion. The orbits are normal. IMPRESSION: Chronic small vessel ischemia and volume loss without acute intracranial  abnormality. Electronically Signed   By: Ulyses Jarred M.D.   On: 12/29/2018 21:32   Ct Angio Neck W Or Wo Contrast  Result Date: 12/28/2018 CLINICAL DATA:  Acute presentation with confusion and left-sided weakness. EXAM: CT ANGIOGRAPHY HEAD AND NECK CT PERFUSION BRAIN TECHNIQUE: Multidetector CT imaging of the head and neck was performed using the standard protocol during bolus administration of intravenous contrast. Multiplanar CT image reconstructions and MIPs were obtained to evaluate the vascular anatomy. Carotid stenosis measurements (when applicable) are obtained utilizing NASCET criteria, using the distal internal carotid diameter as the denominator. Multiphase CT imaging of the brain was performed following IV bolus contrast injection. Subsequent parametric perfusion maps were calculated using RAPID software. CONTRAST:  157mL OMNIPAQUE IOHEXOL 350 MG/ML SOLN COMPARISON:  Head CT same day FINDINGS: CTA NECK FINDINGS Aortic arch: Atherosclerotic calcification. No aneurysm or dissection. Branching pattern is normal. Right carotid system: Common carotid artery widely patent to the bifurcation. Atherosclerotic calcification at the carotid bifurcation and ICA bulb but no stenosis. Cervical ICA widely patent. Left carotid system: Common carotid artery widely patent to the bifurcation. Calcified plaque at the bifurcation but no stenosis. Cervical ICA widely  patent beyond that. Vertebral arteries: Both vertebral artery origins are widely patent. Both vertebral arteries appear patent and normal through the cervical region to the foramen magnum. Skeleton: Ordinary cervical spondylosis. Other neck: No mass or lymphadenopathy. Upper chest: Emphysema and pulmonary scarring.  No active process. Review of the MIP images confirms the above findings CTA HEAD FINDINGS Anterior circulation: Both internal carotid arteries are widely patent through the skull base and siphon regions. There is siphon calcification but no  stenosis greater than 20%. The anterior and middle cerebral vessels are patent without proximal stenosis, aneurysm or vascular malformation. No occluded large or medium vessels are identified. Distal vessels do show some atherosclerotic irregularity. Posterior circulation: Both vertebral arteries widely patent to the basilar. No basilar stenosis. Posterior circulation branch vessels are normal. Distal vessels do show some atherosclerotic irregularity. Venous sinuses: Patent and normal. Anatomic variants: None significant. Delayed phase: Not performed Review of the MIP images confirms the above findings CT Brain Perfusion Findings: ASPECTS: 10 CBF (<30%) Volume: 27mL Perfusion (Tmax>6.0s) volume: 86mL Mismatch Volume: 54mL Infarction Location:None IMPRESSION: No large or medium vessel occlusion.  Negative perfusion study. No carotid bifurcation stenosis.  No posterior circulation stenosis. Distal vessel atherosclerotic irregularity. Electronically Signed   By: Nelson Chimes M.D.   On: 12/28/2018 21:07   Mr Brain Wo Contrast  Result Date: 12/29/2018 CLINICAL DATA:  78 year old male code stroke. Confusion and left side weakness. EXAM: MRI HEAD WITHOUT CONTRAST TECHNIQUE: Multiplanar, multiecho pulse sequences of the brain and surrounding structures were obtained without intravenous contrast. COMPARISON:  CTA head and neck, CT perfusion 12/28/2018. FINDINGS: Brain: No restricted diffusion to suggest acute infarction. No midline shift, mass effect, evidence of mass lesion, ventriculomegaly, extra-axial collection or acute intracranial hemorrhage. Cervicomedullary junction and pituitary are within normal limits. Patchy and confluent cerebral white matter T2 and FLAIR hyperintensity, mostly periventricular. Chronic microhemorrhage in the posterior right temporal lobe on series 14, image 32. Smaller chronic microhemorrhages at the posterior left operculum on image 31. Similar chronic microhemorrhage in the left occipital  lobe on image 27. No cortical encephalomalacia identified. Mild T2 heterogeneity in the bilateral deep gray matter nuclei. More moderate T2 signal abnormality in the pons on series 10, image 9 resembles chronic lacunar infarct. Negative cerebellum. Vascular: Major intracranial vascular flow voids are grossly preserved; rapid T2 weighted imaging was performed. Skull and upper cervical spine: Partially visible degenerative cervical spinal stenosis. Normal bone marrow signal. Sinuses/Orbits: Negative orbits, postoperative changes to the right globe. Paranasal Visualized paranasal sinuses and mastoids are stable and well pneumatized. Other: Patient is intubated and fluid is layering in the pharynx. Scalp and face soft tissues appear negative. IMPRESSION: 1.  No acute intracranial abnormality. 2. Chronic small vessel disease including chronic appearing lacunar infarcts in pons, deep gray matter, and a few scattered chronic microhemorrhages. Electronically Signed   By: Genevie Ann M.D.   On: 12/29/2018 02:17   Ct Cerebral Perfusion W Contrast  Result Date: 12/28/2018 CLINICAL DATA:  Acute presentation with confusion and left-sided weakness. EXAM: CT ANGIOGRAPHY HEAD AND NECK CT PERFUSION BRAIN TECHNIQUE: Multidetector CT imaging of the head and neck was performed using the standard protocol during bolus administration of intravenous contrast. Multiplanar CT image reconstructions and MIPs were obtained to evaluate the vascular anatomy. Carotid stenosis measurements (when applicable) are obtained utilizing NASCET criteria, using the distal internal carotid diameter as the denominator. Multiphase CT imaging of the brain was performed following IV bolus contrast injection. Subsequent parametric perfusion maps were calculated using  RAPID software. CONTRAST:  173mL OMNIPAQUE IOHEXOL 350 MG/ML SOLN COMPARISON:  Head CT same day FINDINGS: CTA NECK FINDINGS Aortic arch: Atherosclerotic calcification. No aneurysm or dissection.  Branching pattern is normal. Right carotid system: Common carotid artery widely patent to the bifurcation. Atherosclerotic calcification at the carotid bifurcation and ICA bulb but no stenosis. Cervical ICA widely patent. Left carotid system: Common carotid artery widely patent to the bifurcation. Calcified plaque at the bifurcation but no stenosis. Cervical ICA widely patent beyond that. Vertebral arteries: Both vertebral artery origins are widely patent. Both vertebral arteries appear patent and normal through the cervical region to the foramen magnum. Skeleton: Ordinary cervical spondylosis. Other neck: No mass or lymphadenopathy. Upper chest: Emphysema and pulmonary scarring.  No active process. Review of the MIP images confirms the above findings CTA HEAD FINDINGS Anterior circulation: Both internal carotid arteries are widely patent through the skull base and siphon regions. There is siphon calcification but no stenosis greater than 20%. The anterior and middle cerebral vessels are patent without proximal stenosis, aneurysm or vascular malformation. No occluded large or medium vessels are identified. Distal vessels do show some atherosclerotic irregularity. Posterior circulation: Both vertebral arteries widely patent to the basilar. No basilar stenosis. Posterior circulation branch vessels are normal. Distal vessels do show some atherosclerotic irregularity. Venous sinuses: Patent and normal. Anatomic variants: None significant. Delayed phase: Not performed Review of the MIP images confirms the above findings CT Brain Perfusion Findings: ASPECTS: 10 CBF (<30%) Volume: 83mL Perfusion (Tmax>6.0s) volume: 3mL Mismatch Volume: 74mL Infarction Location:None IMPRESSION: No large or medium vessel occlusion.  Negative perfusion study. No carotid bifurcation stenosis.  No posterior circulation stenosis. Distal vessel atherosclerotic irregularity. Electronically Signed   By: Nelson Chimes M.D.   On: 12/28/2018 21:07   Dg  Chest Port 1 View  Result Date: 12/28/2018 CLINICAL DATA:  Acute stroke. Endotracheal tube nasogastric tube placement. EXAM: PORTABLE CHEST 1 VIEW COMPARISON:  None. FINDINGS: Endotracheal tube and nasogastric tube are seen in appropriate position. No evidence of pneumothorax. Atelectasis or consolidation is seen in the left lung base. Right lung is clear. IMPRESSION: 1. Endotracheal tube and nasogastric tube in appropriate position. 2. Left basilar atelectasis versus consolidation. Electronically Signed   By: Earle Gell M.D.   On: 12/28/2018 22:44   Ct Head Code Stroke Wo Contrast  Result Date: 12/28/2018 CLINICAL DATA:  Code stroke.  Confusion.  Left-sided weakness. EXAM: CT HEAD WITHOUT CONTRAST TECHNIQUE: Contiguous axial images were obtained from the base of the skull through the vertex without intravenous contrast. COMPARISON:  None. FINDINGS: Brain: Generalized atrophy. Brainstem and cerebellum are unremarkable. Old appearing small vessel insults affecting the thalami, basal ganglia and hemispheric white matter. No sign of acute infarction, mass lesion, hemorrhage, hydrocephalus or extra-axial collection. Vascular: There is atherosclerotic calcification of the major vessels at the base of the brain. Skull: Negative Sinuses/Orbits: Previous sinus surgery. Mild chronic mucosal thickening. Other: None ASPECTS ([place] Stroke Program Early CT Score) - Ganglionic level infarction (caudate, lentiform nuclei, internal capsule, insula, M1-M3 cortex): 7 - Supraganglionic infarction (M4-M6 cortex): 3 Total score (0-10 with 10 being normal): 10 IMPRESSION: 1. No acute finding by CT. Atrophy and chronic small-vessel ischemic changes. 2. ASPECTS is 10. 3. These results were communicated to Dr. Leonel Ramsay at 8:49 pmon 4/29/2020by text page via the [facility] messaging system. Electronically Signed   By: Nelson Chimes M.D.   On: 12/28/2018 20:49   EEG  12/28/2018  1) Right frontal slow activity 2) attenuation of faster  frequencies on the right.  Clinical Interpretation: This EEG is consistent with a nonspecific right hemispheric dysfunction.  This is a nonspecific finding, and can be seen in stroke or postictal state, among other causes.  2D Echocardiogram  1. The left ventricle has normal systolic function with an ejection fraction of 60-65%. The cavity size was normal. Left ventricular diastolic Doppler parameters are consistent with impaired relaxation.  2. The right ventricle has normal systolic function. The cavity was normal. There is no increase in right ventricular wall thickness.  3. No stenosis of the aortic valve.  4. The interatrial septum was not assessed.   PHYSICAL EXAM Elderly African-American male who is intubated but restless despite sedation. . Afebrile. Head is nontraumatic. Neck is supple without bruit.    Cardiac exam no murmur or gallop. Lungs are clear to auscultation. Distal pulses are well felt. Neurological Exam :  Patient is extubated.  He is restless and agitated and thrashing around .[name]  Eyes are partially closed will open them to command.  He will follow simple midline and one-step commands.  He speaks in a slightly hoarse and soft voice but can be understood.  Speaks short sentences only..  There is no forced eye gaze deviation.  Face appears symmetric tongue midline.  He is able to move all 4 extremities against gravity with perhaps right side slightly more than left.  There does not appear to be significant focal weakness but does appear to move right side more than left. ASSESSMENT/PLAN Eric Wade is a 78 y.o. male with history of DB, HTN, HLD presenting with L sided weakness, altered mental status.  Due to concern for seizure, treated with Ativan prior to TPA with no resolution of symptoms.  Ongoing mental status decline led to intubation. Received IV TPA 12/28/2018 at 2102.  Strokelike symptoms status post TPA   Acute encephalopathy versus seizure and postictal  Todd's paralysis   code Stroke CT head No acute stroke. Small vessel disease. Atrophy. ASPECTS 10.     CTA head & neck no LVO.  Distal vessel atherosclerosis.  CT perfusion negative  MRI no acute stroke.  Chronic small vessel disease.  Old lacunes in pontine, deep gray matter. Few scattered chronic microhemorrhages.  CT head 24h post tPA no acute stroke. Chronic small vessel disease. Atrophy.   2D Echo EF 60-65%. No source of embolus   EEG neg for sz  LDL 101  HgbA1c 13.6  UDS negative   Lovenox 40 mg sq daily  for VTE prophylaxis  Diet-n.p.o.  aspirin 81 mg daily prior to admission, now on No antithrombotic. Resume home aspirin 81 mg once able to swallow. As no stroke, no need for antithrombotic by end of hosp D #2  Therapy recommendations: Pending  Disposition: Pending  Acute respiratory failure   Intubated in the ED   Became hypotensive and bradycardic on propofol, changed to fentanyl and placed on dopamine  Also on precedex  Extubated 12/30/2018  Check swallow  CCM on board  Hypertensive Emergency, resolved Hypotension, bradycardia  Home meds: Norvasc 10, diltiazem XR 240, lisinopril 20, Lasix 40  Blood pressure control required prior to TPA administration  Now hypotensive and bradycardic on dopamine drip  BP goal per stroke now < 180  BP range varialbe 80s-180s  Wean dopamine . Long-term BP goal normotensive  Hyperlipidemia  Home meds: No statin  LDL 101, goal < 70  Add statin once able to swallow  Diabetes type II Uncontrolled  Glucose on  admission 706  Home meds: Lantus 40, Actos 30  HgbA1c 13.6, goal < 7.0  Off insulin drip  On levemir 8u q 12h  SSI 3-9 q4h  Diabetic RN following   Other Stroke Risk Factors  Advanced age  Former cigarette smoker  Other Active Problems  AKI on CKD stage III, baseline 1.5 in 08/2017. Cr 1.82  Hypokalemia 3.1 - supplement - recheck in am   Hospital day # 2  Plan speech therapy for  swallow eval and physical occupational therapy consults.  Mobilize out of bed.  Wean dopamine drip and aim for systolic blood pressure greater than 110.  Blood sugar management as per critical care team.  Replace potassium and recheck in the morning.  Discussed with Dr. Chase Caller and patient's nurse.  Greater than 50% time during this 35-minute visit was spent on counseling and coordination of care about his neurological conditions, blood pressure management and discussion with care team Antony Contras, MD  [facility] [address] [place], [place] [address]  Phone [phone] Fax [phone]  To contact Stroke Continuity provider, please refer to [email or web address] After hours, contact General Neurology

## 2018-12-30 NOTE — Progress Notes (Signed)
[facility] Progress Note Patient Name: Eric Wade DOB: January 31, 1941 MRN: [id number]   Date of Service  12/30/2018  HPI/Events of Note  K+ 3.2, needs restraints order renewed  eICU Interventions  Restraints order renewed, Elinke electrolyte replacement orders for K+ entered        Frederik Pear 12/30/2018, 6:49 AM

## 2018-12-30 NOTE — Progress Notes (Addendum)
NAME:  Eric Wade, MRN:  [id number], DOB:  Jun 25, 1941,  LOS: 2   ADMISSION DATE:  12/28/2018,  CONSULTATION DATE:  12/28/2018 REFERRING MD:  Dr. Leonel Ramsay CHIEF COMPLAINT:  Altered mental status  Brief History   33 yoM presenting from home LSW 1700, with left gaze and left hemiplegia,hypertensive, and CBG high.  CTH negative.  TPA given and intubated for airway protection.   History of present illness   HPI obtained from medical chart review as patient is intubated on mechanical ventilation.  78 year old male with prior history of renal insufficiency, hypertension, hyperlipidemia, and DMT2 presenting from home with altered mental status.  Patient had been in his normal status of health, picking his wife up from dialysis, when he was found with altered mental status on bathroom floor.  LSW was 1800.  Presented as code stroke with EMS.  On arrival to ER around 2030, patient was flaccid on left with left gaze. He was afebrile, hypertensive with SBP ~210 and given labetalol with improvement.  CBG noted high.  Neurology present for code stroke.  Labs noted for corrected Na 140, glucose 706, CO2 24, sCr 2.04, AG normal, normal CBC and coags, ETOH neg, UDS pending.  Given concern for possible seizure, was given ativan 2 mg.  Taken to CT which did not show any acute findings.  TPA was then given.  CTA of head and neck was non acute.  He subsequently required intubation for airway protection in ER.  CXR pending. To be admitted by Neurology, PCCM consulted for ventilator management.   Past Medical History   renal insufficiency, HTN, HLD, DMT2, obesity, prostate cancer, ED, gout  Significant Hospital Events   4/29 Admitted  4/30 brady /hypotension with propofol >> fent gtt, dopamine  Consults:   Procedures:  4/29 ETT >>  Significant Diagnostic Tests:   4/30 Echo: The left ventricle has normal systolic function with an ejection fraction of 60-65%. The cavity size was normal. Left  ventricular diastolic Doppler parameters are consistent with impaired relaxation. The right ventricle has normal systolic function. The cavity was normal. There is Wade increase in right ventricular wall thickness.  Wade stenosis of the aortic valve.  The interatrial septum was not assessed  4/29 CTH >> Wade acute finding by CT. Atrophy and chronic small-vessel ischemic Changes.  ASPECTS is 10  4/29 CTA head/ neck/ perfusion >> Wade large or medium vessel occlusion.  Negative perfusion study.  Wade carotid bifurcation stenosis.  Wade posterior circulation stenosis.  Distal vessel atherosclerotic irregularity.  4/30 MRI >> Chronic small vessel disease including chronic appearing lacunar infarcts in pons, deep gray matter, and a few scattered chronic microhemorrhages.  Micro Data:   Antimicrobials:   Interim history/subjective:  Extubated 12/30/2018 Very agitated, confused, striking out at staff, aggressive with staff + 2 L  Objective   Blood pressure (!) 120/57, pulse (!) 58, temperature 97.6 F (36.4 C), temperature source Oral, resp. rate 16, height 6\' 1"  (1.854 m), weight 83.9 kg, SpO2 96 %.    Vent Mode: CPAP;PSV FiO2 (%):  [40 %] 40 % Set Rate:  [16 bmp] 16 bmp Vt Set:  [630 mL] 630 mL PEEP:  [5 cmH20] 5 cmH20 Pressure Support:  [5 cmH20-10 cmH20] 5 cmH20 Plateau Pressure:  [15 cmH20-17 cmH20] 16 cmH20   Intake/Output Summary (Last 24 hours) at 12/30/2018 1017 Last data filed at 12/30/2018 0600 Gross per 24 hour  Intake 2180.28 ml  Output 450 ml  Net 1730.28 ml  Filed Weights   12/28/18 2000 12/28/18 2200 12/30/18 0441  Weight: 84.1 kg 82.5 kg 83.9 kg   Examination: Elderly man, extubated to RA, very agitated, attempting to hit staff Mild pallor, Wade icterus, Wade JVD, Wade LAD Awake, alert, MAE x 4, strong, not following commands, answers few simple questions Bilateral chest excursion, Clear breath sounds throughout, diminished per bases S1-S2 bradycardia, RRR, Wade RMG Soft and  non tender, ND, BS +, Obese Wade edema, Brisk refill, Wade obvious deformities  Chest x-ray 4/30  shows  left lower lobe atelectasis    Resolved Hospital Problem list    Assessment & Plan:  Acute encephalopathy- w/ left gaze, left hemiplegia - ddx include stroke, toxic/ metabolic - hyperglycemia, EEG neg, Wade signs of infection, UDS neg - s/p TPA - Remains confused post extubation/ Agressive  P:  Per Neurology PAD protocol with intermittent fentanyl, goal RA SS 0, failed  propofol  Will try low dose Precedex due to bradycardia, will use Haldol as needed Safety sitter  Acute respiratory failure-intubated for airway protection Extubated 5/1 to RA  P:  Aggressive pulmonary toilet OOB to chair as able IS Q 1 when able to follow commands  Trend CXR  Saturation goal is >92% Swallow eval>> Nutrition per dietary   Hypertensive emergency- resolved - s/p labetalol - Normotensive with  bradycardia P: Tele monitoring  EKG Check Troponin Trend  Mag and replete with goal > 2 Trend and replete K Goal SBP <180; MAP >65 Taper off dopamine, maintain SBP 120 Echo noted as above  amlodipin, Cardizem and lisinopril on hold   AKI - baseline 1.5 in 08/2017  Corrected Na 140 Hypokalemia P:  Trend BMP / urinary output Replace electrolytes as indicated Avoid nephrotoxic agents, ensure adequate renal perfusion Trend UO Bladder scan for  Urinary retention I&O cath prn May need to consider medication if does not resolve    Uncontrolled diabetes type 2, insulin requiring Transitioned off insulin gtt 4/30 P:  CBG Q 4 SSI resistant scale    Best practice:  Diet: NPO until after swallow results Pain/Anxiety/Delirium protocol (if indicated): Precedex VAP protocol (if indicated): yes DVT prophylaxis: s/p TPA GI prophylaxis: PPI Glucose control: CBG/ SSI Mobility: BR Code Status: Full  Family Communication: per Neuro Disposition: ICU  Magdalen Spatz, AGACNP-BC [facility] Pager # [phone] After 4 pm call [phone] 12/30/2018, 10:17 AM

## 2018-12-30 NOTE — Progress Notes (Signed)
PT Cancellation Note  Patient Details Name: Eric Wade MRN: [id number] DOB: [birth year]   Cancelled Treatment:    Reason Eval/Treat Not Completed: Other (comment). Pt has been very agitated this AM.   [place] 12/30/2018, 11:21 AM [place] Pager [phone] Office [phone]

## 2018-12-30 NOTE — Progress Notes (Addendum)
Inpatient Diabetes Program Recommendations  AACE/ADA: New Consensus Statement on Inpatient Glycemic Control (2015)  Target Ranges:  Prepandial:   less than 140 mg/dL      Peak postprandial:   less than 180 mg/dL (1-2 hours)      Critically ill patients:  140 - 180 mg/dL   Lab Results  Component Value Date   GLUCAP 80 12/30/2018   HGBA1C 13.6 (H) 12/30/2018    Review of Glycemic Control Results for Eric Wade, Eric Wade (MRN [id number]) as of 12/30/2018 10:01  Ref. Range 12/29/2018 19:30 12/29/2018 23:27 12/30/2018 03:53 12/30/2018 07:39  Glucose-Capillary Latest Ref Range: 70 - 99 mg/dL 217 (H) 213 (H) 166 (H) 80   Diabetes history: DM 2 Outpatient Diabetes medications:  Lantus 40 units q HS (patient not taking) 70/30 30 units q AM, Actos 30 mg daily Current orders for Inpatient glycemic control:  Levemir 8 units bid, Novolog resistant q 4 hours Inpatient Diabetes Program Recommendations:     Note that patient extubated.  Please consider d/c of ICU glycemic control order set and reduce Novolog correction to moderate q 4 hours.   A1C indicates very uncontrolled CBG's prior to admission with average of 344 mg/dL. Based on A1C, ? Whether patient was taking insulin prior to admit.  Last PCP visit in Care everywhere was 08/2017.  Will need to talk with patient/family during hospitalization to discuss A1C and insulin needs.  Not appropriate to talk to patient today.  Will follow.  Thanks,  Adah Perl, RN, BC-ADM Inpatient Diabetes Coordinator Pager [phone] (8a-5p)

## 2018-12-30 NOTE — Progress Notes (Signed)
OT Cancellation Note  Patient Details Name: Eric Wade MRN: [id number] DOB: October 16, 1940   Cancelled Treatment:    Reason Eval/Treat Not Completed: Patient not medically ready. Pt with agitation. [name], OTR/L Acute Rehab Services Pager [phone] Office [phone]     [name] 12/30/2018, 1:00 PM

## 2018-12-30 NOTE — Procedures (Signed)
Extubation Procedure Note  Patient Details:   Name: Eric Wade DOB: [birth year] MRN: [id number]   Airway Documentation:    Vent end date: 12/30/18 Vent end time: 0843   Evaluation  O2 sats: stable throughout Complications: No apparent complications Patient did tolerate procedure well. Bilateral Breath Sounds: Clear, Diminished   Yes  PT was extubated to a 3L [place]  PT has strong cough and is able to clear secretions   Sats are stable RT to monitor   Jianni Batten, Leonie Douglas 12/30/2018, 8:44 AM

## 2018-12-31 ENCOUNTER — Inpatient Hospital Stay (HOSPITAL_COMMUNITY): Payer: Medicare HMO

## 2018-12-31 ENCOUNTER — Encounter (HOSPITAL_COMMUNITY): Payer: Self-pay

## 2018-12-31 DIAGNOSIS — J302 Other seasonal allergic rhinitis: Secondary | ICD-10-CM

## 2018-12-31 DIAGNOSIS — E1165 Type 2 diabetes mellitus with hyperglycemia: Secondary | ICD-10-CM

## 2018-12-31 DIAGNOSIS — E785 Hyperlipidemia, unspecified: Secondary | ICD-10-CM

## 2018-12-31 DIAGNOSIS — R1312 Dysphagia, oropharyngeal phase: Secondary | ICD-10-CM

## 2018-12-31 DIAGNOSIS — R401 Stupor: Secondary | ICD-10-CM

## 2018-12-31 DIAGNOSIS — R739 Hyperglycemia, unspecified: Secondary | ICD-10-CM

## 2018-12-31 DIAGNOSIS — E7220 Disorder of urea cycle metabolism, unspecified: Secondary | ICD-10-CM

## 2018-12-31 DIAGNOSIS — I10 Essential (primary) hypertension: Secondary | ICD-10-CM

## 2018-12-31 HISTORY — DX: Other seasonal allergic rhinitis: J30.2

## 2018-12-31 LAB — GLUCOSE, CAPILLARY
Glucose-Capillary: 111 mg/dL — ABNORMAL HIGH (ref 70–99)
Glucose-Capillary: 119 mg/dL — ABNORMAL HIGH (ref 70–99)
Glucose-Capillary: 129 mg/dL — ABNORMAL HIGH (ref 70–99)
Glucose-Capillary: 131 mg/dL — ABNORMAL HIGH (ref 70–99)
Glucose-Capillary: 238 mg/dL — ABNORMAL HIGH (ref 70–99)
Glucose-Capillary: 97 mg/dL (ref 70–99)

## 2018-12-31 LAB — HEPATIC FUNCTION PANEL
ALT: 25 U/L (ref 0–44)
AST: 39 U/L (ref 15–41)
Albumin: 2.8 g/dL — ABNORMAL LOW (ref 3.5–5.0)
Alkaline Phosphatase: 58 U/L (ref 38–126)
Bilirubin, Direct: 0.2 mg/dL (ref 0.0–0.2)
Indirect Bilirubin: 0.7 mg/dL (ref 0.3–0.9)
Total Bilirubin: 0.9 mg/dL (ref 0.3–1.2)
Total Protein: 5.7 g/dL — ABNORMAL LOW (ref 6.5–8.1)

## 2018-12-31 LAB — BASIC METABOLIC PANEL
Anion gap: 9 (ref 5–15)
BUN: 13 mg/dL (ref 8–23)
CO2: 21 mmol/L — ABNORMAL LOW (ref 22–32)
Calcium: 8.8 mg/dL — ABNORMAL LOW (ref 8.9–10.3)
Chloride: 113 mmol/L — ABNORMAL HIGH (ref 98–111)
Creatinine, Ser: 1.31 mg/dL — ABNORMAL HIGH (ref 0.61–1.24)
GFR calc Af Amer: 59 mL/min — ABNORMAL LOW (ref >60)
GFR calc non Af Amer: 51 mL/min — ABNORMAL LOW (ref >60)
Glucose, Bld: 100 mg/dL — ABNORMAL HIGH (ref 70–99)
Potassium: 3.8 mmol/L (ref 3.5–5.1)
Sodium: 143 mmol/L (ref 135–145)

## 2018-12-31 LAB — CBC
HCT: 43 % (ref 39.0–52.0)
Hemoglobin: 14.2 g/dL (ref 13.0–17.0)
MCH: 30.1 pg (ref 26.0–34.0)
MCHC: 33 g/dL (ref 30.0–36.0)
MCV: 91.3 fL (ref 80.0–100.0)
Platelets: 117 10*3/uL — ABNORMAL LOW (ref 150–400)
RBC: 4.71 MIL/uL (ref 4.22–5.81)
RDW: 13.8 % (ref 11.5–15.5)
WBC: 7.8 10*3/uL (ref 4.0–10.5)
nRBC: 0 % (ref 0.0–0.2)

## 2018-12-31 LAB — MAGNESIUM: Magnesium: 2.1 mg/dL (ref 1.7–2.4)

## 2018-12-31 LAB — LACTIC ACID, PLASMA: Lactic Acid, Venous: 1.4 mmol/L (ref 0.5–1.9)

## 2018-12-31 LAB — AMMONIA: Ammonia: 109 umol/L — ABNORMAL HIGH (ref 9–35)

## 2018-12-31 MED ORDER — LABETALOL HCL 5 MG/ML IV SOLN
10.0000 mg | INTRAVENOUS | Status: DC | PRN
  Administered 2018-12-31 – 2019-01-01 (×3): 10 mg via INTRAVENOUS
  Filled 2018-12-31 (×3): qty 4

## 2018-12-31 MED ORDER — LISINOPRIL 20 MG PO TABS
20.0000 mg | ORAL_TABLET | Freq: Every day | ORAL | Status: DC
  Administered 2018-12-31 – 2019-01-03 (×4): 20 mg via ORAL
  Filled 2018-12-31 (×4): qty 1

## 2018-12-31 MED ORDER — LACTULOSE 10 GM/15ML PO SOLN
10.0000 g | Freq: Three times a day (TID) | ORAL | Status: DC
  Administered 2018-12-31 – 2019-01-03 (×9): 10 g via ORAL
  Filled 2018-12-31: qty 30
  Filled 2018-12-31: qty 15
  Filled 2018-12-31 (×2): qty 30
  Filled 2018-12-31 (×2): qty 15
  Filled 2018-12-31: qty 30
  Filled 2018-12-31 (×2): qty 15
  Filled 2018-12-31: qty 30

## 2018-12-31 MED ORDER — ATORVASTATIN CALCIUM 10 MG PO TABS
20.0000 mg | ORAL_TABLET | Freq: Every day | ORAL | Status: DC
  Administered 2018-12-31 – 2019-01-02 (×3): 20 mg via ORAL
  Filled 2018-12-31 (×4): qty 2

## 2018-12-31 MED ORDER — DILTIAZEM HCL ER COATED BEADS 240 MG PO CP24
240.0000 mg | ORAL_CAPSULE | Freq: Every day | ORAL | Status: DC
  Administered 2018-12-31 – 2019-01-03 (×4): 240 mg via ORAL
  Filled 2018-12-31 (×6): qty 1

## 2018-12-31 MED ORDER — ASPIRIN EC 81 MG PO TBEC
81.0000 mg | DELAYED_RELEASE_TABLET | Freq: Every day | ORAL | Status: DC
  Administered 2018-12-31 – 2019-01-03 (×4): 81 mg via ORAL
  Filled 2018-12-31 (×4): qty 1

## 2018-12-31 MED ORDER — RESOURCE THICKENUP CLEAR PO POWD
ORAL | Status: DC | PRN
  Filled 2018-12-31: qty 125

## 2018-12-31 MED ORDER — AMLODIPINE BESYLATE 10 MG PO TABS: 10.0000 mg | ORAL_TABLET | Freq: Every day | ORAL | Status: DC

## 2018-12-31 MED ORDER — ASPIRIN 300 MG RE SUPP
300.0000 mg | Freq: Every day | RECTAL | Status: DC
  Filled 2018-12-31: qty 1

## 2018-12-31 NOTE — Evaluation (Signed)
Clinical/Bedside Swallow Evaluation Patient Details  Name: Eric Wade MRN: [id number] Date of Birth: 1941-04-08  Today's Date: 12/31/2018 Time: SLP Start Time (ACUTE ONLY): 0825 SLP Stop Time (ACUTE ONLY): 0900 SLP Time Calculation (min) (ACUTE ONLY): 35 min  Past Medical History:  Past Medical History:  Diagnosis Date  . Allergic rhinitis 08/25/2016  . DIABETES MELLITUS, TYPE II 05/08/2007  . ERECTILE DYSFUNCTION 05/08/2007  . GOUT 07/01/2007  . HYPERLIPIDEMIA 05/08/2007  . HYPERTENSION 05/08/2007  . OBESITY 05/08/2007  . Preventative health care 12/28/2010  . Prostate cancer ([place]) 07/02/2008   Qualifier: Diagnosis of  By: Jenny Reichmann MD, Hunt Oris   . PSA, INCREASED 07/02/2008  . RENAL INSUFFICIENCY 07/01/2007   Past Surgical History: No past surgical history on file. HPI:  Patient presented with unresponsiveness left hemiplegia and left gaze deviation.  It was unclear whether he had a seizure with postictal deficits or an acute stroke.  After he failed to improve with Ativan he was given IV TPA but subsequently required intubation for airway protection.  He became hypotensive and bradycardic on propofol and fentanyl requiring dopamine to be started. he seems to be improving but it appears to be encephalopathic and is moving all 4 extremities this morning left side perhaps slightly less than right but he is not following commands.  MRI of brain without acute intracranial abnormalities. Chronic small vessel disease including chronic appearing lacunar infarcts in pons, deep gray matter, and a few scattered chronic microhemorrhages. Intubated 4/29 to 5/1   Assessment / Plan / Recommendation Clinical Impression  Clinical swallow evaluation concerning for suspected pharyngeal dysphagia. Pt alert, however distractible and impulsive in consumption with POs.  Extensive oral care performed prior to PO trials as lingual coating and palatal secretions observed , dentures removed for additional cleaning. Pt with  overt cough following ice chips, thin liquids, and puree consistencies. Will proceed with instrumental assessment to objectively assess aspiration risk prior to diet initiation. MBSS planned this am. PO meds in puree okay until completion of MBSS.  SLP Visit Diagnosis: Dysphagia, unspecified (R13.10)    Aspiration Risk  Moderate aspiration risk    Diet Recommendation   NPO   Medication Administration: Whole meds with puree    Other  Recommendations Oral Care Recommendations: Oral care QID   Follow up Recommendations        Frequency and Duration            Prognosis Prognosis for Safe Diet Advancement: Good Barriers to Reach Goals: Time post onset      Swallow Study   General Date of Onset: 12/28/18 HPI: Patient presented with unresponsiveness left hemiplegia and left gaze deviation.  It was unclear whether he had a seizure with postictal deficits or an acute stroke.  After he failed to improve with Ativan he was given IV TPA but subsequently required intubation for airway protection.  He became hypotensive and bradycardic on propofol and fentanyl requiring dopamine to be started. he seems to be improving but it appears to be encephalopathic and is moving all 4 extremities this morning left side perhaps slightly less than right but he is not following commands.  MRI of brain without acute intracranial abnormalities. Chronic small vessel disease including chronic appearing lacunar infarcts in pons, deep gray matter, and a few scattered chronic microhemorrhages. Intubated 4/29 to 5/1 Type of Study: Bedside Swallow Evaluation Diet Prior to this Study: NPO Temperature Spikes Noted: No Respiratory Status: Room air History of Recent Intubation: Yes Length of Intubations (days):  3 days Date extubated: 12/30/18 Behavior/Cognition: Alert;Distractible;Requires cueing Oral Cavity Assessment: Dried secretions Oral Care Completed by SLP: Yes Oral Cavity - Dentition: Dentures, top;Dentures,  bottom(SLP removed for additional cleaning ) Vision: Functional for self-feeding Self-Feeding Abilities: Needs assist Patient Positioning: Upright in bed Baseline Vocal Quality: Hoarse Volitional Cough: Strong Volitional Swallow: Unable to elicit    Oral/Motor/Sensory Function Overall Oral Motor/Sensory Function: Generalized oral weakness   Ice Chips Ice chips: Impaired Presentation: Spoon Oral Phase Functional Implications: Prolonged oral transit Pharyngeal Phase Impairments: Suspected delayed Swallow;Multiple swallows;Cough - Delayed   Thin Liquid Thin Liquid: Impaired Presentation: Cup;Straw Pharyngeal  Phase Impairments: Multiple swallows;Cough - Immediate;Cough - Delayed;Suspected delayed Swallow    Nectar Thick Nectar Thick Liquid: Not tested   Honey Thick Honey Thick Liquid: Not tested   Puree Puree: Impaired Presentation: Self Fed Oral Phase Impairments: Reduced labial seal Oral Phase Functional Implications: Prolonged oral transit Pharyngeal Phase Impairments: Suspected delayed Swallow;Multiple swallows;Cough - Delayed   Solid     Solid: Not tested      Alaisha Eversley E Paisleigh Maroney MA, CCC-SLP Acute Rehab Speech Language Pathologist  12/31/2018,9:16 AM

## 2018-12-31 NOTE — Progress Notes (Signed)
NAME:  Eric Wade, MRN:  [id number], DOB:  03-16-1941,  LOS: 3   ADMISSION DATE:  12/28/2018,  CONSULTATION DATE:  12/28/2018 REFERRING MD:  Dr. Leonel Ramsay CHIEF COMPLAINT:  Altered mental status  Brief History    78 year old male with prior history of renal insufficiency, hypertension, hyperlipidemia, and DMT2 presenting from home with altered mental status.  Patient had been in his normal status of health, picking his wife up from dialysis, when he was found with altered mental status on bathroom floor.  LSW was 1800.  Presented as code stroke with EMS.  On arrival to ER around 2030, patient was flaccid on left with left gaze. He was afebrile, hypertensive with SBP ~210 and given labetalol with improvement.  CBG noted high.  Neurology present for code stroke.  Labs noted for corrected Na 140, glucose 706, CO2 24, sCr 2.04, AG normal, normal CBC and coags, ETOH neg, UDS pending.  Given concern for possible seizure, was given ativan 2 mg.  Taken to CT which did not show any acute findings.  TPA was then given.  CTA of head and neck was non acute.  He subsequently required intubation for airway protection in ER.  CXR pending. To be admitted by Neurology, PCCM consulted for ventilator management.   Past Medical History   renal insufficiency, HTN, HLD, DMT2, obesity, prostate cancer, ED, gout  Significant Hospital Events   4/29 Admitted . Intubated 4/30 brady /hypotension with propofol >> fent gtt, dopamine 5/1 - Extubated 12/30/2018. Very agitated, confused, striking out at staff, aggressive with staff. + 2 L  Consults:   Procedures:  4/29 ETT >>  Significant Diagnostic Tests:   4/30 Echo: The left ventricle has normal systolic function with an ejection fraction of 60-65%. The cavity size was normal. Left ventricular diastolic Doppler parameters are consistent with impaired relaxation. The right ventricle has normal systolic function. The cavity was normal. There is no increase in  right ventricular wall thickness.  No stenosis of the aortic valve.  The interatrial septum was not assessed  4/29 CTH >> No acute finding by CT. Atrophy and chronic small-vessel ischemic Changes.  ASPECTS is 10 Results for Eric Wade, Eric Wade (MRN [id number]) as of 12/31/2018 12:49  Ref. Range 12/28/2018 22:52  Amphetamines Latest Ref Range: NONE DETECTED  NONE DETECTED  Barbiturates Latest Ref Range: NONE DETECTED  NONE DETECTED  Benzodiazepines Latest Ref Range: NONE DETECTED  NONE DETECTED  Opiates Latest Ref Range: NONE DETECTED  NONE DETECTED  COCAINE Latest Ref Range: NONE DETECTED  NONE DETECTED  Tetrahydrocannabinol Latest Ref Range: NONE DETECTED  NONE DETECTED    4/29 CTA head/ neck/ perfusion >> No large or medium vessel occlusion.  Negative perfusion study.  No carotid bifurcation stenosis.  No posterior circulation stenosis.  Distal vessel atherosclerotic irregularity.  4/30 MRI >> Chronic small vessel disease including chronic appearing lacunar infarcts in pons, deep gray matter, and a few scattered chronic microhemorrhages.  Micro Data:   Antimicrobials:   Interim history/subjective:    5/2 - neuro does not thinkig patient had primary stroke. Patient on ccm service now. Remains extubated but agitated. Off dopamine. On precedex but being held since 4h and doing well off it but needing sitter. AKi better to 1.3mg %. PErRN = Ammonia level very high - 109  Objective   Blood pressure (!) 165/86, pulse 98, temperature 98.4 F (36.9 C), temperature source Oral, resp. rate 20, height 6\' 1"  (1.854 m), weight 83.9 kg, SpO2 95 %.  Intake/Output Summary (Last 24 hours) at 12/31/2018 1221 Last data filed at 12/31/2018 1050 Gross per 24 hour  Intake 1363.72 ml  Output 675 ml  Net 688.72 ml   Filed Weights   12/28/18 2000 12/28/18 2200 12/30/18 0441  Weight: 84.1 kg 82.5 kg 83.9 kg    General Appearance:  Looks calm and resting but a bit deconditioned Head:   Normocephalic, without obvious abnormality, atraumatic Eyes:  PERRL - yes, conjunctiva/corneas - muddy     Ears:  Normal external ear canals, both ears Nose:  G tube - no Throat:  ETT TUBE - no , OG tube - no Neck:  Supple,  No enlargement/tenderness/nodules Lungs: Clear to auscultation bilaterally, Heart:  S1 and S2 normal, no murmur, CVP - no.  Pressors - no Abdomen:  Soft, no masses, no organomegaly Genitalia / Rectal:  Not done Extremities:  Extremities- intact Skin:  ntact in exposed areas . Sacral area - not examined Neurologic:  Sedation - none -> RASS - -1 . Moves all 4s - yes. CAM-ICU - neg . Orientation - followign comands      LABS    PULMONARY Recent Labs  Lab 12/28/18 2142  PHART 7.357  PCO2ART 49.2*  PO2ART 302.0*  HCO3 27.6  TCO2 29  O2SAT 100.0    CBC Recent Labs  Lab 12/28/18 2031 12/28/18 2142 12/30/18 0217 12/31/18 0216  HGB 15.7 14.6 15.1 14.2  HCT 47.6 43.0 43.3 43.0  WBC 7.6  --  10.5 7.8  PLT 167  --  157 117*    COAGULATION Recent Labs  Lab 12/28/18 2031  INR 0.9    CARDIAC   Recent Labs  Lab 12/28/18 2318 12/30/18 1413  TROPONINI <0.03 0.06*   No results for input(s): PROBNP in the last 168 hours.   CHEMISTRY Recent Labs  Lab 12/28/18 2031 12/28/18 2037 12/28/18 2142 12/30/18 0217 12/30/18 1413 12/31/18 0216  NA 130*  --  131* 140  --  143  K 4.0  --  3.8 3.1*  --  3.8  CL 93*  --   --  105  --  113*  CO2 24  --   --  23  --  21*  GLUCOSE 706*  --   --  193*  --  100*  BUN 23  --   --  24*  --  13  CREATININE 2.04* 1.90*  --  1.82*  --  1.31*  CALCIUM 9.3  --   --  9.2  --  8.8*  MG  --   --   --  2.0 2.0 2.1  PHOS  --   --   --  2.8 3.2  --    Estimated Creatinine Clearance: 50.8 mL/min (A) (by C-G formula based on SCr of 1.31 mg/dL (H)).   LIVER Recent Labs  Lab 12/28/18 2031 12/30/18 0217 12/31/18 0216  AST 23  --  39  ALT 11  --  25  ALKPHOS 97  --  58  BILITOT 1.1  --  0.9  PROT 7.4  --   5.7*  ALBUMIN 4.0 3.4* 2.8*  INR 0.9  --   --      INFECTIOUS Recent Labs  Lab 12/31/18 0400  LATICACIDVEN 1.4     ENDOCRINE CBG (last 3)  Recent Labs    12/31/18 0339 12/31/18 0819 12/31/18 1217  GLUCAP 129* 111* 119*         IMAGING x48h  - image(s) personally visualized  -  highlighted in bold Ct Head Wo Contrast  Result Date: 12/29/2018 CLINICAL DATA:  Stroke follow-up EXAM: CT HEAD WITHOUT CONTRAST TECHNIQUE: Contiguous axial images were obtained from the base of the skull through the vertex without intravenous contrast. COMPARISON:  Head CT 12/28/2018 FINDINGS: Brain: There is no mass, hemorrhage or extra-axial collection. There is generalized atrophy without lobar predilection. Hypodensity of the white matter is most commonly associated with chronic microvascular disease. Vascular: No abnormal hyperdensity of the major intracranial arteries or dural venous sinuses. No intracranial atherosclerosis. Skull: The visualized skull base, calvarium and extracranial soft tissues are normal. Sinuses/Orbits: No fluid levels or advanced mucosal thickening of the visualized paranasal sinuses. No mastoid or middle ear effusion. The orbits are normal. IMPRESSION: Chronic small vessel ischemia and volume loss without acute intracranial abnormality. Electronically Signed   By: Ulyses Jarred M.D.   On: 12/29/2018 21:32   Dg Chest Port 1 View  Result Date: 12/31/2018 CLINICAL DATA:  Respiratory failure. EXAM: PORTABLE CHEST 1 VIEW COMPARISON:  Chest x-ray dated December 28, 2018. FINDINGS: Interval removal of the endotracheal and enteric tubes. The heart size and mediastinal contours are within normal limits. Normal pulmonary vascularity. Improved aeration at the left lung base. No focal consolidation, pleural effusion, or pneumothorax. No acute osseous abnormality. IMPRESSION: Improved left basilar atelectasis. Electronically Signed   By: Titus Dubin M.D.   On: 12/31/2018 06:59   Dg  Swallowing Func-speech Pathology  Result Date: 12/31/2018 Objective Swallowing Evaluation: Type of Study: MBS-Modified Barium Swallow Study  Patient Details Name: Eric Wade MRN: [id number] Date of Birth: 1940/10/24 Today's Date: 12/31/2018 Time: SLP Start Time (ACUTE ONLY): 1000 -SLP Stop Time (ACUTE ONLY): 1020 SLP Time Calculation (min) (ACUTE ONLY): 20 min Past Medical History: Past Medical History: Diagnosis Date  Allergic rhinitis 08/25/2016  DIABETES MELLITUS, TYPE II 05/08/2007  ERECTILE DYSFUNCTION 05/08/2007  GOUT 07/01/2007  HYPERLIPIDEMIA 05/08/2007  HYPERTENSION 05/08/2007  OBESITY 05/08/2007  Preventative health care 12/28/2010  Prostate cancer ([place]) 07/02/2008  Qualifier: Diagnosis of  By: Jenny Reichmann MD, Hunt Oris   PSA, INCREASED 07/02/2008  RENAL INSUFFICIENCY 07/01/2007 Past Surgical History: No past surgical history on file. HPI: Patient presented with unresponsiveness left hemiplegia and left gaze deviation.  It was unclear whether he had a seizure with postictal deficits or an acute stroke.  After he failed to improve with Ativan he was given IV TPA but subsequently required intubation for airway protection.  He became hypotensive and bradycardic on propofol and fentanyl requiring dopamine to be started. he seems to be improving but it appears to be encephalopathic and is moving all 4 extremities this morning left side perhaps slightly less than right but he is not following commands.  MRI of brain without acute intracranial abnormalities. Chronic small vessel disease including chronic appearing lacunar infarcts in pons, deep gray matter, and a few scattered chronic microhemorrhages. Intubated 4/29 to 5/1  Subjective: alert upright in chair for procedure Assessment / Plan / Recommendation CHL IP CLINICAL IMPRESSIONS 12/31/2018 Clinical Impression Pt presents with a mild oral and mild pharyngeal dysphagia suspected to be in setting of short term mechanical ventiliation and decondtioning upon this  admission. Oral deficits c/b prolonged mastication of solid PO with delayed oral transit and mild oral residuals (pt has upper and lower dentures in room however those needed extensive cleaning and were not utilized during MBSS). Pharyngeal deficits c/b reduced timing and efficiency of laryngeal vestibule closure resutling in: -trace penetration of thin liquids via cup sip -audible aspiration of  thin liquids via straw use -trace transient penetration exhibited x1 with nectar thick liquids during barium tablet series. Mild vallecular residuals noted with puree an solid PO in setting of decreased base of tongue retraction that cleared with additional swallows. Recommend dysphagia 3( mechanical soft) and nectar thick liquids with medicines whole in puree.   Anticipate continued progress in swallow recovery with extended time post extubation.  SLP Visit Diagnosis Dysphagia, oropharyngeal phase (R13.12) Attention and concentration deficit following -- Frontal lobe and executive function deficit following -- Impact on safety and function Mild aspiration risk   CHL IP TREATMENT RECOMMENDATION 12/31/2018 Treatment Recommendations Therapy as outlined in treatment plan below   Prognosis 12/31/2018 Prognosis for Safe Diet Advancement Good Barriers to Reach Goals Time post onset Barriers/Prognosis Comment -- CHL IP DIET RECOMMENDATION 12/31/2018 SLP Diet Recommendations Dysphagia 3 (Mech soft) solids;Nectar thick liquid Liquid Administration via Cup;No straw Medication Administration Whole meds with puree Compensations Slow rate;Small sips/bites;Minimize environmental distractions Postural Changes Seated upright at 90 degrees   CHL IP OTHER RECOMMENDATIONS 12/31/2018 Recommended Consults -- Oral Care Recommendations Oral care BID Other Recommendations Order thickener from pharmacy   No flowsheet data found.  CHL IP FREQUENCY AND DURATION 12/31/2018 Speech Therapy Frequency (ACUTE ONLY) min 2x/week Treatment Duration 1 week      CHL IP  ORAL PHASE 12/31/2018 Oral Phase Impaired Oral - Pudding Teaspoon -- Oral - Pudding Cup -- Oral - Honey Teaspoon -- Oral - Honey Cup -- Oral - Nectar Teaspoon -- Oral - Nectar Cup Delayed oral transit;Lingual/palatal residue Oral - Nectar Straw -- Oral - Thin Teaspoon -- Oral - Thin Cup Delayed oral transit Oral - Thin Straw Delayed oral transit Oral - Puree Delayed oral transit;Decreased bolus cohesion;Lingual/palatal residue Oral - Mech Soft -- Oral - Regular Decreased bolus cohesion;Delayed oral transit;Lingual/palatal residue Oral - Multi-Consistency -- Oral - Pill Delayed oral transit Oral Phase - Comment --  CHL IP PHARYNGEAL PHASE 12/31/2018 Pharyngeal Phase Impaired Pharyngeal- Pudding Teaspoon -- Pharyngeal -- Pharyngeal- Pudding Cup -- Pharyngeal -- Pharyngeal- Honey Teaspoon -- Pharyngeal -- Pharyngeal- Honey Cup -- Pharyngeal -- Pharyngeal- Nectar Teaspoon -- Pharyngeal -- Pharyngeal- Nectar Cup Delayed swallow initiation-pyriform sinuses;Penetration/Aspiration during swallow;Penetration/Aspiration before swallow;Reduced epiglottic inversion;Reduced airway/laryngeal closure Pharyngeal Material enters airway, remains ABOVE vocal cords then ejected out Pharyngeal- Nectar Straw -- Pharyngeal -- Pharyngeal- Thin Teaspoon -- Pharyngeal -- Pharyngeal- Thin Cup Delayed swallow initiation-pyriform sinuses;Reduced epiglottic inversion;Penetration/Aspiration before swallow Pharyngeal Material enters airway, remains ABOVE vocal cords and not ejected out Pharyngeal- Thin Straw Penetration/Aspiration before swallow;Delayed swallow initiation-pyriform sinuses;Reduced epiglottic inversion;Reduced airway/laryngeal closure Pharyngeal Material enters airway, passes BELOW cords and not ejected out despite cough attempt by patient Pharyngeal- Puree Delayed swallow initiation-vallecula;Reduced tongue base retraction;Reduced laryngeal elevation;Pharyngeal residue - valleculae;Pharyngeal residue - pyriform Pharyngeal Material does  not enter airway Pharyngeal- Mechanical Soft -- Pharyngeal -- Pharyngeal- Regular Reduced tongue base retraction;Delayed swallow initiation-vallecula;Pharyngeal residue - valleculae;Pharyngeal residue - pyriform Pharyngeal Material does not enter airway Pharyngeal- Multi-consistency -- Pharyngeal -- Pharyngeal- Pill Penetration/Aspiration before swallow;Reduced epiglottic inversion Pharyngeal Material enters airway, remains ABOVE vocal cords then ejected out Pharyngeal Comment --  CHL IP CERVICAL ESOPHAGEAL PHASE 12/31/2018 Cervical Esophageal Phase WFL Pudding Teaspoon -- Pudding Cup -- Honey Teaspoon -- Honey Cup -- Nectar Teaspoon -- Nectar Cup -- Nectar Straw -- Thin Teaspoon -- Thin Cup -- Thin Straw -- Puree -- Mechanical Soft -- Regular -- Multi-consistency -- Pill -- Cervical Esophageal Comment -- Chelsea E Hartness MA, CCC-SLP 12/31/2018, 10:50 AM  Resolved Hospital Problem list    Assessment & Plan:  Acute encephalopathy- w/ left gaze, left hemiplegia - ddx include stroke, toxic/ metabolic - hyperglycemia, EEG neg, no signs of infection, UDS neg - s/p TPA - Remains confused post extubation/ Agressive   - 12/31/2018 -> improving rapidly. Off precdex x 4h but still needing sitter. Not needed haldol. Neuro signed off  P:  Dc precedex Continue haldol prn with qtc monitoring Start lactulose Get RUQ liver to eval liver   Acute respiratory failure-intubated for airway protection Extubated 5/1 to RA   5/2- protecting airway P:  Aggressive pulmonary toilet OOB to chair as able IS Q 1 when able to follow commands  Trend CXR  Saturation goal is >92% Swallow eval>> Nutrition per dietary   Hypertensive emergency- resolved (amlodipin, Cardizem and lisinopril at home) - s/p labetalol   5/2 - BP high. Not on pressors. [name] ECHO 4/30 with diast dysfn   P: Restart home cardizem tele monitoring  Trend  Mag and replete with goal > 2 Trend and replete K Goal SBP <180; MAP  >65     AKI - baseline 1.5 in 08/2017   5/2 - improving  P:  Trend BMP / urinary output Replace electrolytes as indicated Avoid nephrotoxic agents, ensure adequate renal perfusion Trend UO Bladder scan for  Urinary retention I&O cath prn May need to consider medication if does not resolve    Uncontrolled diabetes type 2, insulin requiring Transitioned off insulin gtt 4/30 P:  CBG Q 4 SSI resistant scale    Best practice:  Diet: d3 diet after swallow results Pain/Anxiety/Delirium protocol (if indicated): haldol prn VAP protocol (if indicated): yes DVT prophylaxis: s/p TPA GI prophylaxis: PPI Glucose control: CBG/ SSI Mobility: BR Code Status: Full  Family Communication: last by neuro. Called Remo Lipps [phone] - kept ringing . No one picked up 1:08 PM  Disposition: to floor later today 12/31/2018 if stable but with sitter. From 01/01/19 - TRH primary and ccm off / Dw/ DR Carmie End    Dr. Brand Males, M.D., F.C.C.P,  Pulmonary and Critical Care Medicine Staff Physician, [place] Director - Interstitial Lung Disease  Program  Pulmonary [place] at [place], [place], [address]  Pager: [phone], If no answer or between  15:00h - 7:00h: call [phone] Telephone: [phone]  1:07 PM 12/31/2018

## 2018-12-31 NOTE — Progress Notes (Signed)
PT Cancellation Note  Patient Details Name: DAVIAN HANSHAW MRN: [id number] DOB: 1941-07-09   Cancelled Treatment:      Patient with staff assisting to eat lunch, will re-attempt as time permits. Per RN pt improving and will likely be ready for PT this weekend.   Reinaldo Berber, PT, DPT Acute Rehabilitation Services Pager: [phone] Office: [place] 12/31/2018, 1:46 PM

## 2018-12-31 NOTE — Progress Notes (Signed)
Updated daughter with info of neuro signing off and CCM pursuing metabolic causes of encephalopathy.

## 2018-12-31 NOTE — Progress Notes (Signed)
Modified Barium Swallow Progress Note  Patient Details  Name: Eric Wade MRN: [id number] Date of Birth: 05/14/1941  Today's Date: 12/31/2018  Modified Barium Swallow completed.  Full report located under Chart Review in the Imaging Section.  Brief recommendations include the following:  Clinical Impression  Pt presents with a mild oral and mild pharyngeal dysphagia suspected to be in setting of short term mechanical ventiliation and decondtioning upon this admission. Oral deficits c/b prolonged mastication of solid PO with delayed oral transit and mild oral residuals (pt has upper and lower dentures in room however those needed extensive cleaning and were not utilized during MBSS).  Pharyngeal deficits c/b reduced timing and efficiency of laryngeal vestibule closure resutling in:  -trace penetration of thin liquids via cup  -audible aspiration of thin liquids via straw ; reflexive cough ineffective at clearing aspirates -trace transient penetration exhibited x1 with nectar thick liquids during barium tablet series.   Mild vallecular residuals noted with puree an solid PO in setting of decreased base of tongue retraction that cleared with additional swallows. Recommend dysphagia 3( mechanical soft) and nectar thick liquids with medicines whole in puree.   Anticipate continued progress in swallow recovery with extended time post extubation.    Swallow Evaluation Recommendations       SLP Diet Recommendations: Dysphagia 3 (Mech soft) solids;Nectar thick liquid   Liquid Administration via: Cup;No straw   Medication Administration: Whole meds with puree   Supervision: Staff to assist with self feeding;Full supervision/cueing for compensatory strategies   Compensations: Slow rate;Small sips/bites;Minimize environmental distractions   Postural Changes: Seated upright at 90 degrees   Oral Care Recommendations: Oral care BID   Other Recommendations: Order thickener from  [place] MA, CCC-SLP 12/31/2018,10:54 AM

## 2018-12-31 NOTE — Progress Notes (Signed)
STROKE TEAM PROGRESS NOTE   INTERVAL HISTORY Patient was sitting in bed. More awake alert as per RN. Still has sitter at bedside. He is going to [facility]. Still on D51/2NS, glucose stable now. However, his Ammonia came back at 109. His last BM was two days ago. Discussed with Dr. Brantley Persons.   Vitals:   12/31/18 0700 12/31/18 0800 12/31/18 0819 12/31/18 0900  BP: (!) 162/75 (!) 163/95 (!) 175/85   Pulse: (!) 45 (!) 51 (!) 52 (!) 51  Resp: 15 18  17   Temp:   97.7 F (36.5 C)   TempSrc:   Oral   SpO2: 100% 100% 98% 100%  Weight:      Height:        CBC:  Recent Labs  Lab 12/28/18 2031  12/30/18 0217 12/31/18 0216  WBC 7.6  --  10.5 7.8  NEUTROABS 3.7  --   --   --   HGB 15.7   < > 15.1 14.2  HCT 47.6   < > 43.3 43.0  MCV 90.8  --  86.6 91.3  PLT 167  --  157 117*   < > = values in this interval not displayed.    Basic Metabolic Panel:  Recent Labs  Lab 12/30/18 0217 12/30/18 1413 12/31/18 0216  NA 140  --  143  K 3.1*  --  3.8  CL 105  --  113*  CO2 23  --  21*  GLUCOSE 193*  --  100*  BUN 24*  --  13  CREATININE 1.82*  --  1.31*  CALCIUM 9.2  --  8.8*  MG 2.0 2.0 2.1  PHOS 2.8 3.2  --    Lipid Panel:     Component Value Date/Time   CHOL 179 12/30/2018 0217   TRIG 59 12/30/2018 0217   HDL 66 12/30/2018 0217   CHOLHDL 2.7 12/30/2018 0217   VLDL 12 12/30/2018 0217   LDLCALC 101 (H) 12/30/2018 0217   HgbA1c:  Lab Results  Component Value Date   HGBA1C 13.6 (H) 12/30/2018   Urine Drug Screen:     Component Value Date/Time   LABOPIA NONE DETECTED 12/28/2018 2252   COCAINSCRNUR NONE DETECTED 12/28/2018 2252   LABBENZ NONE DETECTED 12/28/2018 2252   AMPHETMU NONE DETECTED 12/28/2018 2252   THCU NONE DETECTED 12/28/2018 2252   LABBARB NONE DETECTED 12/28/2018 2252    Alcohol Level     Component Value Date/Time   ETH <10 12/28/2018 2031    IMAGING Ct Angio Head W Or Wo Contrast  Result Date: 12/28/2018 CLINICAL DATA:  Acute presentation with confusion  and left-sided weakness. EXAM: CT ANGIOGRAPHY HEAD AND NECK CT PERFUSION BRAIN TECHNIQUE: Multidetector CT imaging of the head and neck was performed using the standard protocol during bolus administration of intravenous contrast. Multiplanar CT image reconstructions and MIPs were obtained to evaluate the vascular anatomy. Carotid stenosis measurements (when applicable) are obtained utilizing NASCET criteria, using the distal internal carotid diameter as the denominator. Multiphase CT imaging of the brain was performed following IV bolus contrast injection. Subsequent parametric perfusion maps were calculated using RAPID software. CONTRAST:  171mL OMNIPAQUE IOHEXOL 350 MG/ML SOLN COMPARISON:  Head CT same day FINDINGS: CTA NECK FINDINGS Aortic arch: Atherosclerotic calcification. No aneurysm or dissection. Branching pattern is normal. Right carotid system: Common carotid artery widely patent to the bifurcation. Atherosclerotic calcification at the carotid bifurcation and ICA bulb but no stenosis. Cervical ICA widely patent. Left carotid system: Common carotid artery widely  patent to the bifurcation. Calcified plaque at the bifurcation but no stenosis. Cervical ICA widely patent beyond that. Vertebral arteries: Both vertebral artery origins are widely patent. Both vertebral arteries appear patent and normal through the cervical region to the foramen magnum. Skeleton: Ordinary cervical spondylosis. Other neck: No mass or lymphadenopathy. Upper chest: Emphysema and pulmonary scarring.  No active process. Review of the MIP images confirms the above findings CTA HEAD FINDINGS Anterior circulation: Both internal carotid arteries are widely patent through the skull base and siphon regions. There is siphon calcification but no stenosis greater than 20%. The anterior and middle cerebral vessels are patent without proximal stenosis, aneurysm or vascular malformation. No occluded large or medium vessels are identified. Distal  vessels do show some atherosclerotic irregularity. Posterior circulation: Both vertebral arteries widely patent to the basilar. No basilar stenosis. Posterior circulation branch vessels are normal. Distal vessels do show some atherosclerotic irregularity. Venous sinuses: Patent and normal. Anatomic variants: None significant. Delayed phase: Not performed Review of the MIP images confirms the above findings CT Brain Perfusion Findings: ASPECTS: 10 CBF (<30%) Volume: 1mL Perfusion (Tmax>6.0s) volume: 76mL Mismatch Volume: 57mL Infarction Location:None IMPRESSION: No large or medium vessel occlusion.  Negative perfusion study. No carotid bifurcation stenosis.  No posterior circulation stenosis. Distal vessel atherosclerotic irregularity. Electronically Signed   By: Nelson Chimes M.D.   On: 12/28/2018 21:07   Ct Head Wo Contrast  Result Date: 12/29/2018 CLINICAL DATA:  Stroke follow-up EXAM: CT HEAD WITHOUT CONTRAST TECHNIQUE: Contiguous axial images were obtained from the base of the skull through the vertex without intravenous contrast. COMPARISON:  Head CT 12/28/2018 FINDINGS: Brain: There is no mass, hemorrhage or extra-axial collection. There is generalized atrophy without lobar predilection. Hypodensity of the white matter is most commonly associated with chronic microvascular disease. Vascular: No abnormal hyperdensity of the major intracranial arteries or dural venous sinuses. No intracranial atherosclerosis. Skull: The visualized skull base, calvarium and extracranial soft tissues are normal. Sinuses/Orbits: No fluid levels or advanced mucosal thickening of the visualized paranasal sinuses. No mastoid or middle ear effusion. The orbits are normal. IMPRESSION: Chronic small vessel ischemia and volume loss without acute intracranial abnormality. Electronically Signed   By: Ulyses Jarred M.D.   On: 12/29/2018 21:32   Ct Angio Neck W Or Wo Contrast  Result Date: 12/28/2018 CLINICAL DATA:  Acute presentation  with confusion and left-sided weakness. EXAM: CT ANGIOGRAPHY HEAD AND NECK CT PERFUSION BRAIN TECHNIQUE: Multidetector CT imaging of the head and neck was performed using the standard protocol during bolus administration of intravenous contrast. Multiplanar CT image reconstructions and MIPs were obtained to evaluate the vascular anatomy. Carotid stenosis measurements (when applicable) are obtained utilizing NASCET criteria, using the distal internal carotid diameter as the denominator. Multiphase CT imaging of the brain was performed following IV bolus contrast injection. Subsequent parametric perfusion maps were calculated using RAPID software. CONTRAST:  126mL OMNIPAQUE IOHEXOL 350 MG/ML SOLN COMPARISON:  Head CT same day FINDINGS: CTA NECK FINDINGS Aortic arch: Atherosclerotic calcification. No aneurysm or dissection. Branching pattern is normal. Right carotid system: Common carotid artery widely patent to the bifurcation. Atherosclerotic calcification at the carotid bifurcation and ICA bulb but no stenosis. Cervical ICA widely patent. Left carotid system: Common carotid artery widely patent to the bifurcation. Calcified plaque at the bifurcation but no stenosis. Cervical ICA widely patent beyond that. Vertebral arteries: Both vertebral artery origins are widely patent. Both vertebral arteries appear patent and normal through the cervical region to the foramen magnum.  Skeleton: Ordinary cervical spondylosis. Other neck: No mass or lymphadenopathy. Upper chest: Emphysema and pulmonary scarring.  No active process. Review of the MIP images confirms the above findings CTA HEAD FINDINGS Anterior circulation: Both internal carotid arteries are widely patent through the skull base and siphon regions. There is siphon calcification but no stenosis greater than 20%. The anterior and middle cerebral vessels are patent without proximal stenosis, aneurysm or vascular malformation. No occluded large or medium vessels are  identified. Distal vessels do show some atherosclerotic irregularity. Posterior circulation: Both vertebral arteries widely patent to the basilar. No basilar stenosis. Posterior circulation branch vessels are normal. Distal vessels do show some atherosclerotic irregularity. Venous sinuses: Patent and normal. Anatomic variants: None significant. Delayed phase: Not performed Review of the MIP images confirms the above findings CT Brain Perfusion Findings: ASPECTS: 10 CBF (<30%) Volume: 23mL Perfusion (Tmax>6.0s) volume: 77mL Mismatch Volume: 75mL Infarction Location:None IMPRESSION: No large or medium vessel occlusion.  Negative perfusion study. No carotid bifurcation stenosis.  No posterior circulation stenosis. Distal vessel atherosclerotic irregularity. Electronically Signed   By: Nelson Chimes M.D.   On: 12/28/2018 21:07   Mr Brain Wo Contrast  Result Date: 12/29/2018 CLINICAL DATA:  78 year old male code stroke. Confusion and left side weakness. EXAM: MRI HEAD WITHOUT CONTRAST TECHNIQUE: Multiplanar, multiecho pulse sequences of the brain and surrounding structures were obtained without intravenous contrast. COMPARISON:  CTA head and neck, CT perfusion 12/28/2018. FINDINGS: Brain: No restricted diffusion to suggest acute infarction. No midline shift, mass effect, evidence of mass lesion, ventriculomegaly, extra-axial collection or acute intracranial hemorrhage. Cervicomedullary junction and pituitary are within normal limits. Patchy and confluent cerebral white matter T2 and FLAIR hyperintensity, mostly periventricular. Chronic microhemorrhage in the posterior right temporal lobe on series 14, image 32. Smaller chronic microhemorrhages at the posterior left operculum on image 31. Similar chronic microhemorrhage in the left occipital lobe on image 27. No cortical encephalomalacia identified. Mild T2 heterogeneity in the bilateral deep gray matter nuclei. More moderate T2 signal abnormality in the pons on series  10, image 9 resembles chronic lacunar infarct. Negative cerebellum. Vascular: Major intracranial vascular flow voids are grossly preserved; rapid T2 weighted imaging was performed. Skull and upper cervical spine: Partially visible degenerative cervical spinal stenosis. Normal bone marrow signal. Sinuses/Orbits: Negative orbits, postoperative changes to the right globe. Paranasal Visualized paranasal sinuses and mastoids are stable and well pneumatized. Other: Patient is intubated and fluid is layering in the pharynx. Scalp and face soft tissues appear negative. IMPRESSION: 1.  No acute intracranial abnormality. 2. Chronic small vessel disease including chronic appearing lacunar infarcts in pons, deep gray matter, and a few scattered chronic microhemorrhages. Electronically Signed   By: Genevie Ann M.D.   On: 12/29/2018 02:17   Ct Cerebral Perfusion W Contrast  Result Date: 12/28/2018 CLINICAL DATA:  Acute presentation with confusion and left-sided weakness. EXAM: CT ANGIOGRAPHY HEAD AND NECK CT PERFUSION BRAIN TECHNIQUE: Multidetector CT imaging of the head and neck was performed using the standard protocol during bolus administration of intravenous contrast. Multiplanar CT image reconstructions and MIPs were obtained to evaluate the vascular anatomy. Carotid stenosis measurements (when applicable) are obtained utilizing NASCET criteria, using the distal internal carotid diameter as the denominator. Multiphase CT imaging of the brain was performed following IV bolus contrast injection. Subsequent parametric perfusion maps were calculated using RAPID software. CONTRAST:  183mL OMNIPAQUE IOHEXOL 350 MG/ML SOLN COMPARISON:  Head CT same day FINDINGS: CTA NECK FINDINGS Aortic arch: Atherosclerotic calcification. No aneurysm or  dissection. Branching pattern is normal. Right carotid system: Common carotid artery widely patent to the bifurcation. Atherosclerotic calcification at the carotid bifurcation and ICA bulb but no  stenosis. Cervical ICA widely patent. Left carotid system: Common carotid artery widely patent to the bifurcation. Calcified plaque at the bifurcation but no stenosis. Cervical ICA widely patent beyond that. Vertebral arteries: Both vertebral artery origins are widely patent. Both vertebral arteries appear patent and normal through the cervical region to the foramen magnum. Skeleton: Ordinary cervical spondylosis. Other neck: No mass or lymphadenopathy. Upper chest: Emphysema and pulmonary scarring.  No active process. Review of the MIP images confirms the above findings CTA HEAD FINDINGS Anterior circulation: Both internal carotid arteries are widely patent through the skull base and siphon regions. There is siphon calcification but no stenosis greater than 20%. The anterior and middle cerebral vessels are patent without proximal stenosis, aneurysm or vascular malformation. No occluded large or medium vessels are identified. Distal vessels do show some atherosclerotic irregularity. Posterior circulation: Both vertebral arteries widely patent to the basilar. No basilar stenosis. Posterior circulation branch vessels are normal. Distal vessels do show some atherosclerotic irregularity. Venous sinuses: Patent and normal. Anatomic variants: None significant. Delayed phase: Not performed Review of the MIP images confirms the above findings CT Brain Perfusion Findings: ASPECTS: 10 CBF (<30%) Volume: 25mL Perfusion (Tmax>6.0s) volume: 55mL Mismatch Volume: 58mL Infarction Location:None IMPRESSION: No large or medium vessel occlusion.  Negative perfusion study. No carotid bifurcation stenosis.  No posterior circulation stenosis. Distal vessel atherosclerotic irregularity. Electronically Signed   By: Nelson Chimes M.D.   On: 12/28/2018 21:07   Dg Chest Port 1 View  Result Date: 12/31/2018 CLINICAL DATA:  Respiratory failure. EXAM: PORTABLE CHEST 1 VIEW COMPARISON:  Chest x-ray dated December 28, 2018. FINDINGS: Interval removal  of the endotracheal and enteric tubes. The heart size and mediastinal contours are within normal limits. Normal pulmonary vascularity. Improved aeration at the left lung base. No focal consolidation, pleural effusion, or pneumothorax. No acute osseous abnormality. IMPRESSION: Improved left basilar atelectasis. Electronically Signed   By: Titus Dubin M.D.   On: 12/31/2018 06:59   Dg Chest Port 1 View  Result Date: 12/28/2018 CLINICAL DATA:  Acute stroke. Endotracheal tube nasogastric tube placement. EXAM: PORTABLE CHEST 1 VIEW COMPARISON:  None. FINDINGS: Endotracheal tube and nasogastric tube are seen in appropriate position. No evidence of pneumothorax. Atelectasis or consolidation is seen in the left lung base. Right lung is clear. IMPRESSION: 1. Endotracheal tube and nasogastric tube in appropriate position. 2. Left basilar atelectasis versus consolidation. Electronically Signed   By: Earle Gell M.D.   On: 12/28/2018 22:44   Ct Head Code Stroke Wo Contrast  Result Date: 12/28/2018 CLINICAL DATA:  Code stroke.  Confusion.  Left-sided weakness. EXAM: CT HEAD WITHOUT CONTRAST TECHNIQUE: Contiguous axial images were obtained from the base of the skull through the vertex without intravenous contrast. COMPARISON:  None. FINDINGS: Brain: Generalized atrophy. Brainstem and cerebellum are unremarkable. Old appearing small vessel insults affecting the thalami, basal ganglia and hemispheric white matter. No sign of acute infarction, mass lesion, hemorrhage, hydrocephalus or extra-axial collection. Vascular: There is atherosclerotic calcification of the major vessels at the base of the brain. Skull: Negative Sinuses/Orbits: Previous sinus surgery. Mild chronic mucosal thickening. Other: None ASPECTS ([place] Stroke Program Early CT Score) - Ganglionic level infarction (caudate, lentiform nuclei, internal capsule, insula, M1-M3 cortex): 7 - Supraganglionic infarction (M4-M6 cortex): 3 Total score (0-10 with 10  being normal): 10 IMPRESSION: 1. No  acute finding by CT. Atrophy and chronic small-vessel ischemic changes. 2. ASPECTS is 10. 3. These results were communicated to Dr. Leonel Ramsay at 8:49 pmon 4/29/2020by text page via the [facility] messaging system. Electronically Signed   By: Nelson Chimes M.D.   On: 12/28/2018 20:49   EEG  12/28/2018  1) Right frontal slow activity 2) attenuation of faster frequencies on the right.  Clinical Interpretation: This EEG is consistent with a nonspecific right hemispheric dysfunction.  This is a nonspecific finding, and can be seen in stroke or postictal state, among other causes.  2D Echocardiogram  1. The left ventricle has normal systolic function with an ejection fraction of 60-65%. The cavity size was normal. Left ventricular diastolic Doppler parameters are consistent with impaired relaxation.  2. The right ventricle has normal systolic function. The cavity was normal. There is no increase in right ventricular wall thickness.  3. No stenosis of the aortic valve.  4. The interatrial septum was not assessed.   PHYSICAL EXAM Elderly African-American male who is intubated but restless despite sedation. . Afebrile. Head is nontraumatic. Neck is supple without bruit.    Cardiac exam no murmur or gallop. Lungs are clear to auscultation. Distal pulses are well felt.  Neurological Exam :  Patient is mildly sleepy but easily arousable. Calm and cooperative on exam.  Greeting to providers and answered questions appropriately. Orientated to self, but not to place, age and time.  He follows simple one-step commands.  There is no forced eye gaze deviation and visual field is full.  Face appears symmetric tongue midline.  He is able to move all 4 extremities against gravity symmetrically. Sensation symmetrically as per pt, no ataxia b/l UEs but slow on FTN testing. Gait not tested.   ASSESSMENT/PLAN Mr. GLOYD HAPP is a 78 y.o. male with history of DB, HTN, HLD presenting  with L sided weakness, altered mental status.  Due to concern for seizure, treated with Ativan prior to TPA with no resolution of symptoms.  Ongoing mental status decline led to intubation. Received IV TPA 12/28/2018 at 2102.  Strokelike symptoms with left gaze and left hemiplegia status post TPA, now resolved, most likely stroke mimic due to severe hyperglycemia, DDx including seizure or DWI negative stroke.   Code Stroke CT head No acute stroke. Small vessel disease. Atrophy. ASPECTS 10.     CTA head & neck no LVO.  Distal vessel atherosclerosis.  CT perfusion negative  MRI no acute stroke.  Chronic small vessel disease.  Old lacunes in pontine, deep gray matter. Few scattered chronic microhemorrhages.  CT head 24h post tPA no acute stroke. Chronic small vessel disease. Atrophy.   2D Echo EF 60-65%. No source of embolus   EEG neg for sz  LDL 101  HgbA1c 13.6  UDS negative   Lovenox 40 mg sq daily  for VTE prophylaxis  Diet-n.p.o.  aspirin 81 mg daily prior to admission, now on ASA 81 or ASA 300 PR. Continue ASA on discharge.   Therapy recommendations: Pending  Disposition: Pending  Acute respiratory failure   Intubated in the ED   off precedex  Extubated 12/30/2018, tolerating well  BMS pending  CCM on board  Hyperammonemia   Ammonia level 109  Last BM 2 days ago  Consider lactulose once po access  Discussed with Dr. Chase Caller   CCM on board  Dysphagia   Currently NPO  BMS pending today  Speech on board  Once passed swallow, give ASA and lipitor from  po route  Hypertensive Emergency, resolved Hypotension, bradycardia  Home meds: Norvasc 10, diltiazem XR 240, lisinopril 20, Lasix 40  hypotensive and bradycardic on dopamine drip, now off  BP goal per stroke now < 180 . Long-term BP goal normotensive  Hyperlipidemia  Home meds: No statin  LDL 101, goal < 70  Put on lipitor 20  Continue statin on discharge  Diabetes type II  Uncontrolled  Glucose on admission 706  Home meds: Lantus 40, Actos 30  HgbA1c 13.6, goal < 7.0  Off insulin drip  On levemir 8u q 12h  SSI 3-9 q4h  Diabetic RN following   CCM on board  Other Stroke Risk Factors  Advanced age  Former cigarette smoker  Other Active Problems  AKI on CKD stage III, baseline 1.5 in 08/2017. Cr 1.82->1.31  Hypokalemia 3.1 - supplement ->3.8   Hospital day # 3  This patient is critically ill due to stroke symptoms, hyperammonemia, hypotension, dysphagia and agitation and at significant risk of neurological worsening, death form stroke, seizure, encephalopathy, shock and sepsis. This patient's care requires constant monitoring of vital signs, hemodynamics, respiratory and cardiac monitoring, review of multiple databases, neurological assessment, discussion with family, other specialists and medical decision making of high complexity. I spent 35 minutes of neurocritical care time in the care of this patient.  Neurology will sign off. Please call with questions. No neuro follow up needed at this time. Thanks for the consult.  Rosalin Hawking, MD PhD Stroke Neurology 12/31/2018 10:33 AM    To contact Stroke Continuity provider, please refer to [email or web address] After hours, contact General Neurology

## 2018-12-31 NOTE — Progress Notes (Signed)
Tx to Radiology for swallow study w/ vss.

## 2019-01-01 ENCOUNTER — Other Ambulatory Visit: Payer: Self-pay

## 2019-01-01 ENCOUNTER — Encounter (HOSPITAL_COMMUNITY): Payer: Self-pay

## 2019-01-01 DIAGNOSIS — G9341 Metabolic encephalopathy: Secondary | ICD-10-CM

## 2019-01-01 DIAGNOSIS — Z794 Long term (current) use of insulin: Secondary | ICD-10-CM

## 2019-01-01 DIAGNOSIS — E119 Type 2 diabetes mellitus without complications: Secondary | ICD-10-CM

## 2019-01-01 LAB — CBC WITH DIFFERENTIAL/PLATELET
Abs Immature Granulocytes: 0.03 10*3/uL (ref 0.00–0.07)
Basophils Absolute: 0 10*3/uL (ref 0.0–0.1)
Basophils Relative: 0 %
Eosinophils Absolute: 0 10*3/uL (ref 0.0–0.5)
Eosinophils Relative: 0 %
HCT: 38.7 % — ABNORMAL LOW (ref 39.0–52.0)
Hemoglobin: 13 g/dL (ref 13.0–17.0)
Immature Granulocytes: 0 %
Lymphocytes Relative: 27 %
Lymphs Abs: 1.9 10*3/uL (ref 0.7–4.0)
MCH: 29.8 pg (ref 26.0–34.0)
MCHC: 33.6 g/dL (ref 30.0–36.0)
MCV: 88.8 fL (ref 80.0–100.0)
Monocytes Absolute: 0.8 10*3/uL (ref 0.1–1.0)
Monocytes Relative: 11 %
Neutro Abs: 4.3 10*3/uL (ref 1.7–7.7)
Neutrophils Relative %: 62 %
Platelets: 141 10*3/uL — ABNORMAL LOW (ref 150–400)
RBC: 4.36 MIL/uL (ref 4.22–5.81)
RDW: 13.4 % (ref 11.5–15.5)
WBC: 7.1 10*3/uL (ref 4.0–10.5)
nRBC: 0 % (ref 0.0–0.2)

## 2019-01-01 LAB — GLUCOSE, CAPILLARY
Glucose-Capillary: 108 mg/dL — ABNORMAL HIGH (ref 70–99)
Glucose-Capillary: 237 mg/dL — ABNORMAL HIGH (ref 70–99)
Glucose-Capillary: 253 mg/dL — ABNORMAL HIGH (ref 70–99)
Glucose-Capillary: 261 mg/dL — ABNORMAL HIGH (ref 70–99)
Glucose-Capillary: 279 mg/dL — ABNORMAL HIGH (ref 70–99)
Glucose-Capillary: 75 mg/dL (ref 70–99)

## 2019-01-01 LAB — MAGNESIUM: Magnesium: 2.1 mg/dL (ref 1.7–2.4)

## 2019-01-01 LAB — LACTIC ACID, PLASMA: Lactic Acid, Venous: 2.6 mmol/L (ref 0.5–1.9)

## 2019-01-01 LAB — HEPATIC FUNCTION PANEL
ALT: 21 U/L (ref 0–44)
AST: 35 U/L (ref 15–41)
Albumin: 2.7 g/dL — ABNORMAL LOW (ref 3.5–5.0)
Alkaline Phosphatase: 54 U/L (ref 38–126)
Bilirubin, Direct: 0.1 mg/dL (ref 0.0–0.2)
Indirect Bilirubin: 0.8 mg/dL (ref 0.3–0.9)
Total Bilirubin: 0.9 mg/dL (ref 0.3–1.2)
Total Protein: 5.6 g/dL — ABNORMAL LOW (ref 6.5–8.1)

## 2019-01-01 LAB — PHOSPHORUS: Phosphorus: 2.1 mg/dL — ABNORMAL LOW (ref 2.5–4.6)

## 2019-01-01 MED ORDER — INSULIN ASPART 100 UNIT/ML ~~LOC~~ SOLN
0.0000 [IU] | SUBCUTANEOUS | Status: DC
  Administered 2019-01-01: 01:00:00 8 [IU] via SUBCUTANEOUS
  Administered 2019-01-01: 16:00:00 5 [IU] via SUBCUTANEOUS
  Administered 2019-01-01: 11 [IU] via SUBCUTANEOUS
  Administered 2019-01-01: 12:00:00 8 [IU] via SUBCUTANEOUS
  Administered 2019-01-02: 21:00:00 3 [IU] via SUBCUTANEOUS
  Administered 2019-01-02: 04:00:00 11 [IU] via SUBCUTANEOUS
  Administered 2019-01-02: 3 [IU] via SUBCUTANEOUS
  Administered 2019-01-02: 10:00:00 2 [IU] via SUBCUTANEOUS
  Administered 2019-01-02: 8 [IU] via SUBCUTANEOUS
  Administered 2019-01-03: 3 [IU] via SUBCUTANEOUS
  Administered 2019-01-03: 12:00:00 11 [IU] via SUBCUTANEOUS
  Administered 2019-01-03: 3 [IU] via SUBCUTANEOUS
  Administered 2019-01-03: 09:00:00 2 [IU] via SUBCUTANEOUS

## 2019-01-01 NOTE — Progress Notes (Signed)
Alert and oriented x  3 with some periods of confusion. Able to follow commands. Out of bed to recliner. Minimal assistance needed with ADL's. Continues on fluid hydration D5 1/2 NS at 50 ml/hr. Transfer orders noted. Call bell within reach. Encouraged to report signs and symptoms to staff. Vital signs within normal limits (see flow sheet).

## 2019-01-01 NOTE — TOC Initial Note (Signed)
Transition of Care [facility]) - Initial/Assessment Note    Patient Details  Name: Eric Wade MRN: [id number] Date of Birth: Oct 06, 1940  [facility] CM/SW Contact:    Carles Collet, RN Phone Number: 01/01/2019, 1:06 PM  Clinical Narrative:              Eric Wade w patient's daughter over the phone. She verified that she would like to have her dad stay with her at DC. He would have 24 hour supervision between her, her sister, and her father's wife.  PTA he was independent, drove, and cared for his wife. PT and OT evals are not available at this time. Anticipate PT OT, SLP, RN HHA needs at DC. Eric Wade states that she has a 3/1 available. Patient will need urinal sent home and other DME per PT OT evals discussed prior to DC. Eric Wade expressed that she would also like refills for medications called in with any new DC meds. CM reviewed MAR for any meds needs auth or coupons and there were none. Eric Wade states that the patient's wife has a [employer] they would like to use for dad as well. She is going to find the name of the company and update CM.   DC Address [address] [place] [address]  Eric Wade, Eric Wade Daughter [phone]  [phone]          Expected Discharge Plan: [place] Barriers to Discharge: Continued Medical Work up   Patient Goals and CMS Choice Patient states their goals for this hospitalization and ongoing recovery are:: spoke w daughter who wishes for patient to live with her at DC      Expected Discharge Plan and Services Expected Discharge Plan: [place]   Discharge Planning Services: CM Consult Post Acute Care Choice: Home Health, Durable Medical Equipment   Expected Discharge Date: 01/03/19                                    Prior Living Arrangements/Services                       Activities of Daily Living Home Assistive Devices/Equipment: None ADL Screening (condition at time of  admission) Patient's cognitive ability adequate to safely complete daily activities?: Yes Is the patient deaf or have difficulty hearing?: No Does the patient have difficulty seeing, even when wearing glasses/contacts?: No Does the patient have difficulty concentrating, remembering, or making decisions?: No Patient able to express need for assistance with ADLs?: No Does the patient have difficulty dressing or bathing?: No Independently performs ADLs?: Yes (appropriate for developmental age) Does the patient have difficulty walking or climbing stairs?: No Weakness of Legs: None Weakness of Arms/Hands: None  Permission Sought/Granted                  Emotional Assessment              Admission diagnosis:  CODE STROKE Patient Active Problem List   Diagnosis Date Noted  . Encephalopathy acute   . Stroke ([place]) 12/28/2018  . Allergic rhinitis 08/25/2016  . Preventative health care 12/28/2010  . Prostate cancer ([place]) 07/02/2008  . GOUT 07/01/2007  . CKD (chronic kidney disease) stage 3, GFR 30-59 ml/min (HCC) 07/01/2007  . Diabetes ([place]) 05/08/2007  . Hyperlipidemia 05/08/2007  . OBESITY 05/08/2007  . ERECTILE DYSFUNCTION 05/08/2007  . Essential hypertension  05/08/2007   PCP:  [name], MD Pharmacy:   [facility], [place] [address] Phone: [phone] Fax: [phone]  [id number] [place], [place] - [place] [address] [place] [address] Phone: [phone] Fax: [phone]     Social Determinants of Health (SDOH) Interventions    Readmission Risk Interventions No flowsheet data found.

## 2019-01-01 NOTE — Progress Notes (Signed)
PROGRESS NOTE  Eric Wade:[id number] DOB: 18-Dec-1940 DOA: 12/28/2018 PCP: [name], MD   Brief summary:  Per ED triage  "Per EMS pt lkw at approximately 1700 by family when he returned from taking his wife to dialysis.  Was found down sometime later by family in the bathroom having fallen from the toilet. C collar in place. Pt had left sided flacidity and left gaze with incontinence.  "  78 year old male with prior history of renal insufficiency, hypertension, hyperlipidemia, and DMT2 presenting from home with altered mental status.  Patient had been in his normal status of health, picking his wife up from dialysis, when he was found with altered mental status on bathroom floor.  LSW was 1800.  Presented as code stroke with EMS.  On arrival to ER around 2030, patient was flaccid on left with left gaze. He was afebrile, hypertensive with SBP ~210 and given labetalol with improvement.  CBG noted high.  Neurology present for code stroke.  Labs noted for corrected Na 140, glucose 706, CO2 24, sCr 2.04, AG normal, normal CBC and coags, ETOH neg, UDS pending.  Given concern for possible seizure, was given ativan 2 mg.  Taken to CT which did not show any acute findings.  TPA was then given.  CTA of head and neck was non acute.  He subsequently required intubation for airway protection in ER.  CXR pending. To be admitted by Neurology, PCCM consulted for ventilator management.    4/29 Admitted . Intubated 4/30 brady /hypotension with propofol >> fent gtt, dopamine 5/1 - Extubated 12/30/2018. Very agitated, confused, striking out at staff, aggressive with staff. + 2 L 5/3 on hospitalist service  HPI/Recap of past 24 hours:  Slightly confused, denies pain No fever, no sob Sitter at bedside due to agitation overnight Sitter reports his gait is  a little wobble   Assessment/Plan: Active Problems:   Stroke ([place])   Encephalopathy acute  Acute metabolic Encephalopathy of unclear  etiology  -Possibly due to  uncontrolled diabetes, as daughter reports patient has not been taking care of himself, he has not been taking his bp and diabetes meds, patient has been c/o feeling weak, peeing a lot, for the last months  -Patient presented with Strokelike symptoms with left gaze and left hemiplegia , initial concerns for seizure, he did not improved with ativan, he then received TPA on 4/29 9pm for presumed CVA MRI brain negative for CVA, CTA head & neck no LVO.  Distal vessel atherosclerosis CT head 24h post tPA no acute stroke. Chronic small vessel disease. Atrophy. 2D Echo EF 60-65%. No source of embolus  EEG neg for sz UDS negative    Ammonia level elevated at 109, lft wnl,  INR 0.9, mild fatty liver on ab [place]  He was intubated in the ED for airway protection, he is extubated on 5/1  -he is improving, still with slight confusion, but easily redirected today  Dysphagia  -no prior history of this, currently on dysphagia diet III/nectar thick liquid per speech recommendation -aspiration precaution  Insulin dependent DM2, uncontrolled  Glucose on admission 706 Home meds: Lantus 40, Actos 30 HgbA1c 13.6, goal < 7.0 He is treated with insulin drip initially, currently on subQ insulin  HTN Noncompliance with meds per family meds bottles at home are old, he has not get any recent refills per family Will adjust bp meds, currently on cardizem, lisinopril   AKI on CKDIII  baseline 1.5 in 08/2017. Cr  1.82->1.31 Followed with nephrology     Hypokalemia Replaced  Patient will return to daughter's home , will need home health RN for disease management , family reports his is noncompliance with meds/diet prior to this.  Code Status: full  Family Communication: patient , daughter Eric Wade Look over the phone  Disposition Plan: home with home health in 1-2 days    Consultants:  Admitted to icu on 4/29, on hospitalist service on 5/3  Neurology, signed off on 5/2   Procedures:  Intubation/extubation  Antibiotics:  none   Objective: BP (!) 161/63   Pulse 63   Temp 98.4 F (36.9 C) (Oral)   Resp 14   Ht 6\' 1"  (1.854 m)   Wt 83.9 kg   SpO2 100%   BMI 24.40 kg/m   Intake/Output Summary (Last 24 hours) at 01/01/2019 1016 Last data filed at 01/01/2019 0845 Gross per 24 hour  Intake 1599.29 ml  Output 1125 ml  Net 474.29 ml   Filed Weights   12/28/18 2000 12/28/18 2200 12/30/18 0441  Weight: 84.1 kg 82.5 kg 83.9 kg    Exam: Patient is examined daily including today on 01/01/2019, exams remain the same as of yesterday except that has changed    General:  NAD, slightly confused, but easily redirected, pleasant and calm currently  Cardiovascular: RRR  Respiratory: CTABL  Abdomen: Soft/ND/NT, positive BS  Musculoskeletal: No Edema  Neuro: alert, slightly confused, but easily redirected, pleasant and calm currently  Data Reviewed: Basic Metabolic Panel: Recent Labs  Lab 12/28/18 2031 12/28/18 2037 12/28/18 2142 12/30/18 0217 12/30/18 1413 12/31/18 0216 01/01/19 0209  NA 130*  --  131* 140  --  143  --   K 4.0  --  3.8 3.1*  --  3.8  --   CL 93*  --   --  105  --  113*  --   CO2 24  --   --  23  --  21*  --   GLUCOSE 706*  --   --  193*  --  100*  --   BUN 23  --   --  24*  --  13  --   CREATININE 2.04* 1.90*  --  1.82*  --  1.31*  --   CALCIUM 9.3  --   --  9.2  --  8.8*  --   MG  --   --   --  2.0 2.0 2.1 2.1  PHOS  --   --   --  2.8 3.2  --  2.1*   Liver Function Tests: Recent Labs  Lab 12/28/18 2031 12/30/18 0217 12/31/18 0216 01/01/19 0209  AST 23  --  39 35  ALT 11  --  25 21  ALKPHOS 97  --  58 54  BILITOT 1.1  --  0.9 0.9  PROT 7.4  --  5.7* 5.6*  ALBUMIN 4.0 3.4* 2.8* 2.7*   No results for input(s): LIPASE, AMYLASE in the last 168 hours. Recent Labs  Lab 12/31/18 0216  AMMONIA 109*   CBC: Recent Labs  Lab 12/28/18 2031 12/28/18 2142 12/30/18 0217 12/31/18 0216 01/01/19 0209  WBC 7.6  --   10.5 7.8 7.1  NEUTROABS 3.7  --   --   --  4.3  HGB 15.7 14.6 15.1 14.2 13.0  HCT 47.6 43.0 43.3 43.0 38.7*  MCV 90.8  --  86.6 91.3 88.8  PLT 167  --  157 117* 141*   Cardiac Enzymes:  Recent Labs  Lab 12/28/18 2318 12/30/18 1413  TROPONINI <0.03 0.06*   BNP (last 3 results) No results for input(s): BNP in the last 8760 hours.  ProBNP (last 3 results) No results for input(s): PROBNP in the last 8760 hours.  CBG: Recent Labs  Lab 12/31/18 1544 12/31/18 2003 01/01/19 0013 01/01/19 0435 01/01/19 0807  GLUCAP 238* 131* 253* 75 108*    Recent Results (from the past 240 hour(s))  MRSA PCR Screening     Status: None   Collection Time: 12/28/18 10:41 PM  Result Value Ref Range Status   MRSA by PCR NEGATIVE NEGATIVE Final    Comment:        The GeneXpert MRSA Assay (FDA approved for NASAL specimens only), is one component of a comprehensive MRSA colonization surveillance program. It is not intended to diagnose MRSA infection nor to guide or monitor treatment for MRSA infections. Performed at [facility], [place] [address] [place], [place] [address]      Studies: Dg Swallowing Func-speech Pathology  Result Date: 12/31/2018 Objective Swallowing Evaluation: Type of Study: MBS-Modified Barium Swallow Study  Patient Details Name: JAGER KOSKA MRN: [id number] Date of Birth: September 09, 1940 Today's Date: 12/31/2018 Time: SLP Start Time (ACUTE ONLY): 1000 -SLP Stop Time (ACUTE ONLY): 1020 SLP Time Calculation (min) (ACUTE ONLY): 20 min Past Medical History: Past Medical History: Diagnosis Date . Allergic rhinitis 08/25/2016 . DIABETES MELLITUS, TYPE II 05/08/2007 . ERECTILE DYSFUNCTION 05/08/2007 . GOUT 07/01/2007 . HYPERLIPIDEMIA 05/08/2007 . HYPERTENSION 05/08/2007 . OBESITY 05/08/2007 . Preventative health care 12/28/2010 . Prostate cancer ([place]) 07/02/2008  Qualifier: Diagnosis of  By: Jenny Reichmann MD, Hunt Oris  . PSA, INCREASED 07/02/2008 . RENAL INSUFFICIENCY 07/01/2007 Past Surgical History: No  past surgical history on file. HPI: Patient presented with unresponsiveness left hemiplegia and left gaze deviation.  It was unclear whether he had a seizure with postictal deficits or an acute stroke.  After he failed to improve with Ativan he was given IV TPA but subsequently required intubation for airway protection.  He became hypotensive and bradycardic on propofol and fentanyl requiring dopamine to be started. he seems to be improving but it appears to be encephalopathic and is moving all 4 extremities this morning left side perhaps slightly less than right but he is not following commands.  MRI of brain without acute intracranial abnormalities. Chronic small vessel disease including chronic appearing lacunar infarcts in pons, deep gray matter, and a few scattered chronic microhemorrhages. Intubated 4/29 to 5/1  Subjective: alert upright in chair for procedure Assessment / Plan / Recommendation CHL IP CLINICAL IMPRESSIONS 12/31/2018 Clinical Impression Pt presents with a mild oral and mild pharyngeal dysphagia suspected to be in setting of short term mechanical ventiliation and decondtioning upon this admission. Oral deficits c/b prolonged mastication of solid PO with delayed oral transit and mild oral residuals (pt has upper and lower dentures in room however those needed extensive cleaning and were not utilized during MBSS). Pharyngeal deficits c/b reduced timing and efficiency of laryngeal vestibule closure resutling in: -trace penetration of thin liquids via cup sip -audible aspiration of thin liquids via straw use -trace transient penetration exhibited x1 with nectar thick liquids during barium tablet series. Mild vallecular residuals noted with puree an solid PO in setting of decreased base of tongue retraction that cleared with additional swallows. Recommend dysphagia 3( mechanical soft) and nectar thick liquids with medicines whole in puree.   Anticipate continued progress in swallow recovery with  extended time post extubation.  SLP Visit Diagnosis Dysphagia, oropharyngeal phase (R13.12) Attention and concentration deficit following -- Frontal lobe and executive function deficit following -- Impact on safety and function Mild aspiration risk   CHL IP TREATMENT RECOMMENDATION 12/31/2018 Treatment Recommendations Therapy as outlined in treatment plan below   Prognosis 12/31/2018 Prognosis for Safe Diet Advancement Good Barriers to Reach Goals Time post onset Barriers/Prognosis Comment -- CHL IP DIET RECOMMENDATION 12/31/2018 SLP Diet Recommendations Dysphagia 3 (Mech soft) solids;Nectar thick liquid Liquid Administration via Cup;No straw Medication Administration Whole meds with puree Compensations Slow rate;Small sips/bites;Minimize environmental distractions Postural Changes Seated upright at 90 degrees   CHL IP OTHER RECOMMENDATIONS 12/31/2018 Recommended Consults -- Oral Care Recommendations Oral care BID Other Recommendations Order thickener from pharmacy   No flowsheet data found.  CHL IP FREQUENCY AND DURATION 12/31/2018 Speech Therapy Frequency (ACUTE ONLY) min 2x/week Treatment Duration 1 week      CHL IP ORAL PHASE 12/31/2018 Oral Phase Impaired Oral - Pudding Teaspoon -- Oral - Pudding Cup -- Oral - Honey Teaspoon -- Oral - Honey Cup -- Oral - Nectar Teaspoon -- Oral - Nectar Cup Delayed oral transit;Lingual/palatal residue Oral - Nectar Straw -- Oral - Thin Teaspoon -- Oral - Thin Cup Delayed oral transit Oral - Thin Straw Delayed oral transit Oral - Puree Delayed oral transit;Decreased bolus cohesion;Lingual/palatal residue Oral - Mech Soft -- Oral - Regular Decreased bolus cohesion;Delayed oral transit;Lingual/palatal residue Oral - Multi-Consistency -- Oral - Pill Delayed oral transit Oral Phase - Comment --  CHL IP PHARYNGEAL PHASE 12/31/2018 Pharyngeal Phase Impaired Pharyngeal- Pudding Teaspoon -- Pharyngeal -- Pharyngeal- Pudding Cup -- Pharyngeal -- Pharyngeal- Honey Teaspoon -- Pharyngeal -- Pharyngeal-  Honey Cup -- Pharyngeal -- Pharyngeal- Nectar Teaspoon -- Pharyngeal -- Pharyngeal- Nectar Cup Delayed swallow initiation-pyriform sinuses;Penetration/Aspiration during swallow;Penetration/Aspiration before swallow;Reduced epiglottic inversion;Reduced airway/laryngeal closure Pharyngeal Material enters airway, remains ABOVE vocal cords then ejected out Pharyngeal- Nectar Straw -- Pharyngeal -- Pharyngeal- Thin Teaspoon -- Pharyngeal -- Pharyngeal- Thin Cup Delayed swallow initiation-pyriform sinuses;Reduced epiglottic inversion;Penetration/Aspiration before swallow Pharyngeal Material enters airway, remains ABOVE vocal cords and not ejected out Pharyngeal- Thin Straw Penetration/Aspiration before swallow;Delayed swallow initiation-pyriform sinuses;Reduced epiglottic inversion;Reduced airway/laryngeal closure Pharyngeal Material enters airway, passes BELOW cords and not ejected out despite cough attempt by patient Pharyngeal- Puree Delayed swallow initiation-vallecula;Reduced tongue base retraction;Reduced laryngeal elevation;Pharyngeal residue - valleculae;Pharyngeal residue - pyriform Pharyngeal Material does not enter airway Pharyngeal- Mechanical Soft -- Pharyngeal -- Pharyngeal- Regular Reduced tongue base retraction;Delayed swallow initiation-vallecula;Pharyngeal residue - valleculae;Pharyngeal residue - pyriform Pharyngeal Material does not enter airway Pharyngeal- Multi-consistency -- Pharyngeal -- Pharyngeal- Pill Penetration/Aspiration before swallow;Reduced epiglottic inversion Pharyngeal Material enters airway, remains ABOVE vocal cords then ejected out Pharyngeal Comment --  CHL IP CERVICAL ESOPHAGEAL PHASE 12/31/2018 Cervical Esophageal Phase WFL Pudding Teaspoon -- Pudding Cup -- Honey Teaspoon -- Honey Cup -- Nectar Teaspoon -- Nectar Cup -- Nectar Straw -- Thin Teaspoon -- Thin Cup -- Thin Straw -- Puree -- Mechanical Soft -- Regular -- Multi-consistency -- Pill -- Cervical Esophageal Comment --  Chelsea E Hartness MA, CCC-SLP 12/31/2018, 10:50 AM              US Abdomen Limited Ruq  Result Date: 12/31/2018 CLINICAL DATA:  In cephalopathy EXAM: ULTRASOUND ABDOMEN LIMITED RIGHT UPPER QUADRANT COMPARISON:  None. FINDINGS: Gallbladder: Multiple small stones within the gallbladder measuring 4 mm. No wall thickening or sonographic Murphy's sign. Common bile duct: Diameter: Normal caliber, 3 mm Liver: Increased echotexture compatible with fatty infiltration. No focal abnormality or  biliary ductal dilatation. Portal vein is patent on color Doppler imaging with normal direction of blood flow towards the liver. IMPRESSION: Cholelithiasis.  No sonographic evidence of acute cholecystitis. Mild fatty infiltration of the liver. Electronically Signed   By: Rolm Baptise M.D.   On: 12/31/2018 22:24    Scheduled Meds: . aspirin EC  81 mg Oral Daily   Or  . aspirin  300 mg Rectal Daily  . atorvastatin  20 mg Oral q1800  . chlorhexidine  15 mL Mouth Rinse BID  . diltiazem  240 mg Oral Daily  . enoxaparin (LOVENOX) injection  40 mg Subcutaneous Q24H  . haloperidol lactate  5 mg Intramuscular Once  . insulin aspart  0-15 Units Subcutaneous Q4H  . lactulose  10 g Oral TID  . lisinopril  20 mg Oral Daily  . mouth rinse  15 mL Mouth Rinse q12n4p  . multivitamin with minerals  1 tablet Per Tube Daily  . pantoprazole (PROTONIX) IV  40 mg Intravenous QHS    Continuous Infusions: . dextrose 5 % and 0.45% NaCl 50 mL/hr at 01/01/19 0700     Time spent: 27mins I have personally reviewed and interpreted on  01/01/2019 daily labs, tele strips, imagings as discussed above under date review session and assessment and plans.  I reviewed all nursing notes, pharmacy notes, consultant notes,  vitals, pertinent old records  I have discussed plan of care as described above with RN , patient and family on 01/01/2019   Florencia Reasons MD, PhD  [facility] Pager [phone]. If 7PM-7AM, please contact night-coverage at  [email or web address], password [facility] 01/01/2019, 10:16 AM  LOS: 4 days

## 2019-01-01 NOTE — Progress Notes (Signed)
[facility] notified of lactic acid of 2.6. Will continue to monitor. CRITICAL VALUE ALERT  Critical Value:  Lactic Acid 2.6  Date & Time Notied:  01/01/19   0310  Provider Notified: Warren Lacy  Orders Received/Actions taken: Nothing at this time.

## 2019-01-02 DIAGNOSIS — E876 Hypokalemia: Secondary | ICD-10-CM

## 2019-01-02 LAB — CBC WITH DIFFERENTIAL/PLATELET
Abs Immature Granulocytes: 0.03 10*3/uL (ref 0.00–0.07)
Basophils Absolute: 0 10*3/uL (ref 0.0–0.1)
Basophils Relative: 1 %
Eosinophils Absolute: 0.1 10*3/uL (ref 0.0–0.5)
Eosinophils Relative: 1 %
HCT: 37.8 % — ABNORMAL LOW (ref 39.0–52.0)
Hemoglobin: 12.7 g/dL — ABNORMAL LOW (ref 13.0–17.0)
Immature Granulocytes: 0 %
Lymphocytes Relative: 25 %
Lymphs Abs: 1.7 10*3/uL (ref 0.7–4.0)
MCH: 29.7 pg (ref 26.0–34.0)
MCHC: 33.6 g/dL (ref 30.0–36.0)
MCV: 88.5 fL (ref 80.0–100.0)
Monocytes Absolute: 0.7 10*3/uL (ref 0.1–1.0)
Monocytes Relative: 10 %
Neutro Abs: 4.5 10*3/uL (ref 1.7–7.7)
Neutrophils Relative %: 63 %
Platelets: 126 10*3/uL — ABNORMAL LOW (ref 150–400)
RBC: 4.27 MIL/uL (ref 4.22–5.81)
RDW: 13.2 % (ref 11.5–15.5)
WBC: 7.1 10*3/uL (ref 4.0–10.5)
nRBC: 0 % (ref 0.0–0.2)

## 2019-01-02 LAB — AMMONIA: Ammonia: 64 umol/L — ABNORMAL HIGH (ref 9–35)

## 2019-01-02 LAB — BASIC METABOLIC PANEL
Anion gap: 11 (ref 5–15)
BUN: 14 mg/dL (ref 8–23)
CO2: 19 mmol/L — ABNORMAL LOW (ref 22–32)
Calcium: 8.9 mg/dL (ref 8.9–10.3)
Chloride: 111 mmol/L (ref 98–111)
Creatinine, Ser: 1.47 mg/dL — ABNORMAL HIGH (ref 0.61–1.24)
GFR calc Af Amer: 51 mL/min — ABNORMAL LOW (ref >60)
GFR calc non Af Amer: 44 mL/min — ABNORMAL LOW (ref >60)
Glucose, Bld: 221 mg/dL — ABNORMAL HIGH (ref 70–99)
Potassium: 3.2 mmol/L — ABNORMAL LOW (ref 3.5–5.1)
Sodium: 141 mmol/L (ref 135–145)

## 2019-01-02 LAB — LACTIC ACID, PLASMA: Lactic Acid, Venous: 2.4 mmol/L (ref 0.5–1.9)

## 2019-01-02 LAB — GLUCOSE, CAPILLARY
Glucose-Capillary: 316 mg/dL — ABNORMAL HIGH (ref 70–99)
Glucose-Capillary: 132 mg/dL — ABNORMAL HIGH (ref 70–99)
Glucose-Capillary: 174 mg/dL — ABNORMAL HIGH (ref 70–99)
Glucose-Capillary: 265 mg/dL — ABNORMAL HIGH (ref 70–99)
Glucose-Capillary: 172 mg/dL — ABNORMAL HIGH (ref 70–99)
Glucose-Capillary: 200 mg/dL — ABNORMAL HIGH (ref 70–99)

## 2019-01-02 LAB — MAGNESIUM: Magnesium: 1.9 mg/dL (ref 1.7–2.4)

## 2019-01-02 LAB — PHOSPHORUS: Phosphorus: 1.8 mg/dL — ABNORMAL LOW (ref 2.5–4.6)

## 2019-01-02 MED ORDER — PANTOPRAZOLE SODIUM 40 MG PO TBEC
40.0000 mg | DELAYED_RELEASE_TABLET | Freq: Every day | ORAL | Status: DC
  Administered 2019-01-02: 21:00:00 40 mg via ORAL
  Filled 2019-01-02: qty 1

## 2019-01-02 MED ORDER — K PHOS MONO-SOD PHOS DI & MONO 155-852-130 MG PO TABS
250.0000 mg | ORAL_TABLET | Freq: Every day | ORAL | Status: AC
  Administered 2019-01-02 – 2019-01-03 (×2): 250 mg via ORAL
  Filled 2019-01-02 (×2): qty 1

## 2019-01-02 MED ORDER — INSULIN DETEMIR 100 UNIT/ML ~~LOC~~ SOLN
6.0000 [IU] | Freq: Two times a day (BID) | SUBCUTANEOUS | Status: DC
  Administered 2019-01-02 – 2019-01-03 (×2): 6 [IU] via SUBCUTANEOUS
  Filled 2019-01-02 (×4): qty 0.06

## 2019-01-02 MED ORDER — ISOSORBIDE MONONITRATE ER 30 MG PO TB24
30.0000 mg | ORAL_TABLET | Freq: Every day | ORAL | Status: DC
  Administered 2019-01-03: 11:00:00 30 mg via ORAL
  Filled 2019-01-02: qty 1

## 2019-01-02 MED ORDER — INSULIN STARTER KIT- SYRINGES (ENGLISH)
1.0000 | Freq: Once | Status: DC
  Filled 2019-01-02: qty 1

## 2019-01-02 MED ORDER — POTASSIUM CHLORIDE CRYS ER 20 MEQ PO TBCR
40.0000 meq | EXTENDED_RELEASE_TABLET | Freq: Once | ORAL | Status: AC
  Administered 2019-01-02: 10:00:00 40 meq via ORAL
  Filled 2019-01-02: qty 2

## 2019-01-02 MED ORDER — LACTATED RINGERS IV SOLN
INTRAVENOUS | Status: AC
  Administered 2019-01-02 – 2019-01-03 (×3): via INTRAVENOUS

## 2019-01-02 NOTE — Progress Notes (Signed)
Recommendations are for SNF. CM attempted to reach daughter, unable to leave voice message. CM following.

## 2019-01-02 NOTE — Progress Notes (Addendum)
Inpatient Diabetes Program Recommendations  AACE/ADA: New Consensus Statement on Inpatient Glycemic Control (2015)  Target Ranges:  Prepandial:   less than 140 mg/dL      Peak postprandial:   less than 180 mg/dL (1-2 hours)      Critically ill patients:  140 - 180 mg/dL  Results for LATHAM, KINZLER (MRN [id number]) as of 01/02/2019 08:18  Ref. Range 01/02/2019 05:40  Glucose Latest Ref Range: 70 - 99 mg/dL 221 (H)   Results for JULES, BATY (MRN [id number]) as of 01/02/2019 08:18  Ref. Range 01/01/2019 08:07 01/01/2019 11:41 01/01/2019 15:27 01/01/2019 23:39 01/02/2019 03:27  Glucose-Capillary Latest Ref Range: 70 - 99 mg/dL 108 (H) 261 (H) 237 (H) 279 (H) 316 (H)  Results for USHER, HEDBERG (MRN [id number]) as of 01/02/2019 08:18  Ref. Range 09/14/2017 14:39 12/30/2018 02:17  Hemoglobin A1C Latest Ref Range: 4.8 - 5.6 % 7.0 (H) 13.6 (H)   Review of Glycemic Control  Diabetes history: DM2 Outpatient Diabetes medications: 70/30 30 units with breakfast, Actos 30 mg daily Current orders for Inpatient glycemic control: Novolog 0-15 units Q4H  Inpatient Diabetes Program Recommendations:   Insulin - Basal:Noted patient was ordered Levemir 8 units BID which was discontinued on 01/01/19 at 00:33 Since Levemir was discontinued, glucose has been much higher (up to 316 mg/dl at 3:27 am today). Please consider reordering basal insulin. Recommend ordering Levemir 6 units BID.  Addendum 01/02/19'@13' :00:  Per chart, patient may be discharging home with daughter. Called Armonte Tortorella (patient's daughter) and she states that she has DM knowledge knows some information about insulin but notes that her sister is a med aide and can administer insulin. [name] stated that her sister could instruct her on how to administer insulin at home. [name] reports that her father was taking Novolin 70/30 insulin (vials) at home and he does not have much insulin left in the current vial at home.  Discussed insulins (long acting and  rapid acting insulins) and explained that discharge instructions would specify which insulins and dosages patient will need as an outpatient. [name] reports that patient does not have a glucometer at home and has not been checking his glucose. Discussed glucose monitoring and glucose goals. Informed [name] that it would be requested that patient get a prescription for a glucometer and testing supplies at discharge. Discussed A1C of 13.6% on 12/30/18 and informed her that patient's average glucose has been 344 mg/dl over the past 2-3 months.  Discussed A1C goal of 7% or less. Encouraged [name] to be sure to check glucose 3-4 times a day, keep a record of glucose values, and administer insulin as MD prescribes at discharge. Encouraged [name] to be sure patient follows up with provider and to take glucose values to appointments so that adjustments can be made if needed. Reviewed hypoglycemia along with proper treatment. [name] verbalized understanding of information discussed and states that she has no further questions at this time related to DM or insulin. Will order an insulin starter kit for family to use at home to enhance knowledge on insulin administration.  Thanks, Barnie Alderman, RN, MSN, CDE Diabetes Coordinator Inpatient Diabetes Program [phone] (Team Pager from 8am to 5pm)

## 2019-01-02 NOTE — Evaluation (Signed)
Occupational Therapy Evaluation Patient Details Name: Eric Wade MRN: [id number] DOB: February 27, 1941 Today's Date: 01/02/2019    History of Present Illness Pt is an 78 y/o male admitted secondary to L sided weakness and AMS. CT and MRI negative for acute stroke, however, did reveal chronic infarcts. Pt given TPA upon admission. Thought to have Acute metabolic Encephalopathy of unclear etiology, possibly due to uncontrolled DM. Pt was intubated 4/29 and extubated 5/1. PMH includes HTN and DM.    Clinical Impression   This 78 yo male admitted with above presents to acute OT with decreased cognition and decreased balance affecting his safety and independence with basic ADLs. He will benefit from acute OT with follow up HHOT and 24 hour S/ and A any time he is up on his feet.    Follow Up Recommendations  Home health OT;Supervision/Assistance - 24 hour    Equipment Recommendations  3 in 1 bedside commode       Precautions / Restrictions Precautions Precautions: Fall Precaution Comments: safety sitter Restrictions Weight Bearing Restrictions: No      Mobility Bed Mobility Overal bed mobility: Needs Assistance Bed Mobility: Supine to Sit;Sit to Supine     Supine to sit: Supervision Sit to supine: Supervision      Transfers Overall transfer level: Needs assistance Equipment used: Rolling walker (2 wheeled) Transfers: Sit to/from Stand Sit to Stand: Min assist         General transfer comment: Able to stand at EOB and low march in place without LOB as long as holding onto RW.    Balance Overall balance assessment: Needs assistance Sitting-balance support: No upper extremity supported;Feet supported Sitting balance-Leahy Scale: Good Sitting balance - Comments: able to doff and donn socks sitting EOB without LOB   Standing balance support: Bilateral upper extremity supported Standing balance-Leahy Scale: Poor                             ADL either  performed or assessed with clinical judgement   ADL Overall ADL's : Needs assistance/impaired Eating/Feeding: Independent;Sitting   Grooming: Set up;Supervision/safety;Sitting   Upper Body Bathing: Supervision/ safety;Set up;Sitting   Lower Body Bathing: Minimal assistance;Sit to/from stand   Upper Body Dressing : Supervision/safety;Set up;Sitting   Lower Body Dressing: Minimal assistance;Sit to/from stand   Toilet Transfer: Minimal assistance;Stand-pivot;BSC   Toileting- Clothing Manipulation and Hygiene: Moderate assistance Toileting - Clothing Manipulation Details (indicate cue type and reason): min A sit<>stand             Vision Patient Visual Report: No change from baseline              Pertinent Vitals/Pain Pain Assessment: No/denies pain     Hand Dominance Right   Extremity/Trunk Assessment Upper Extremity Assessment Upper Extremity Assessment: Overall WFL for tasks assessed           Communication Communication Communication: No difficulties   Cognition Arousal/Alertness: Awake/alert Behavior During Therapy: WFL for tasks assessed/performed Overall Cognitive Status: Impaired/Different from baseline Area of Impairment: Following commands;Safety/judgement;Problem solving                       Following Commands: Follows one step commands inconsistently;Follows one step commands with increased time Safety/Judgement: Decreased awareness of safety;Decreased awareness of deficits   Problem Solving: Requires verbal cues;Requires tactile cues General Comments: Asked pt to take his socks off and put them back on--he started to take his gown  off. Asked him to side step up towards the [facility] and he started to turn walker to walk to [place] expects to be discharged to:: Private residence Living Arrangements: Spouse/significant other;Children(dtr) Available Help at Discharge: Family Type of Home: House Home Access:  Stairs to enter Technical brewer of Steps: 3 Entrance Stairs-Rails: Left Home Layout: One level     Bathroom Shower/Tub: Tub/shower unit;Walk-in shower   Bathroom Toilet: Standard     Home Equipment: Shower seat          Prior Functioning/Environment Level of Independence: Independent                 OT Problem List: Impaired balance (sitting and/or standing);Decreased cognition      OT Treatment/Interventions: Self-care/ADL training;Balance training;DME and/or AE instruction;Patient/family education    OT Goals(Current goals can be found in the care plan section) Acute Rehab OT Goals Patient Stated Goal: to go home OT Goal Formulation: With patient Time For Goal Achievement: 01/16/19 Potential to Achieve Goals: Good  OT Frequency: Min 2X/week              AM-PAC OT "6 Clicks" Daily Activity     Outcome Measure Help from another person eating meals?: None Help from another person taking care of personal grooming?: A Little Help from another person toileting, which includes using toliet, bedpan, or urinal?: A Lot Help from another person bathing (including washing, rinsing, drying)?: A Little Help from another person to put on and taking off regular upper body clothing?: A Little Help from another person to put on and taking off regular lower body clothing?: A Little 6 Click Score: 18   End of Session Equipment Utilized During Treatment: Gait belt;Rolling walker  Activity Tolerance: Patient tolerated treatment well Patient left: in bed;with call bell/phone within reach;with bed alarm set(sitter in room)  OT Visit Diagnosis: Unsteadiness on feet (R26.81);Other abnormalities of gait and mobility (R26.89);Other symptoms and signs involving cognitive function                Time: 1450-1505 OT Time Calculation (min): 15 min Charges:  OT General Charges $OT Visit: 1 Visit OT Evaluation $OT Eval Moderate Complexity: 1 Mod  Eric Wade, OTR/L Acute  [employer] Pager [phone] Office [phone]     Eric Wade 01/02/2019, 4:38 PM

## 2019-01-02 NOTE — Progress Notes (Signed)
Patient confused and agitated overnight.  Patient pulling at lines and pulled out midline, Haldol 4mg  given once, mitts placed, sitter at bedside.

## 2019-01-02 NOTE — Evaluation (Addendum)
Physical Therapy Evaluation Patient Details Name: Eric Wade MRN: [id number] DOB: 16-Mar-1941 Today's Date: 01/02/2019   History of Present Illness  Pt is an 78 y/o male admitted secondary to L sided weakness and AMS. CT and MRI negative for acute stroke, however, did reveal chronic infarcts. Pt given TPA upon admission. Thought to have Acute metabolic Encephalopathy of unclear etiology, possibly due to uncontrolled DM. Pt was intubated 4/29 and extubated 5/1. PMH includes HTN and DM.   Clinical Impression  Pt admitted secondary to problem above with deficits below. Pt requiring mod to max A to perform side steps at EOB. Pt very unsteady and with posterior lean. Pt also presenting with cognitive deficits, however, unsure of baseline. Feel pt is at increased risk for falls and will require increased assist at d/c. Will continue to follow acutely to maximize functional mobility independence and safety.   Update 01/02/2019 at 4:06 pm: Per OT, case management reports daughter would prefer to take pt home at d/c. If pt goes home, will require HHPT and 24/7 assist. With DME below.     Follow Up Recommendations SNF;Supervision/Assistance - 24 hour    Equipment Recommendations  Rolling walker with 5" wheels    Recommendations for Other Services       Precautions / Restrictions Precautions Precautions: Fall;Other (comment) Precaution Comments: safety sitter Restrictions Weight Bearing Restrictions: No      Mobility  Bed Mobility Overal bed mobility: Needs Assistance Bed Mobility: Supine to Sit;Sit to Supine     Supine to sit: Supervision Sit to supine: Supervision   General bed mobility comments: Supervision for safety.   Transfers Overall transfer level: Needs assistance Equipment used: 1 person hand held assist Transfers: Sit to/from Stand Sit to Stand: Mod assist         General transfer comment: Mod A for lift assist and steadying assist. Performed sit<>stand X2 and pt  with posterior lean in standing.   Ambulation/Gait Ambulation/Gait assistance: Mod assist;Max assist   Assistive device: 1 person hand held assist   Gait velocity: Decreased    General Gait Details: Took a few side steps at EOB, however, pt very unsteady and with LOB posteriorly requiring mod to max A to maintain balance.   Stairs            Wheelchair Mobility    Modified Rankin (Stroke Patients Only)       Balance Overall balance assessment: Needs assistance Sitting-balance support: No upper extremity supported;Feet supported Sitting balance-Leahy Scale: Fair   Postural control: Posterior lean Standing balance support: Single extremity supported;During functional activity Standing balance-Leahy Scale: Poor Standing balance comment: Reliant on UE and external assist to maintain balance. Pt with posterior lean in standing.                              Pertinent Vitals/Pain Pain Assessment: No/denies pain    Home Living Family/patient expects to be discharged to:: Private residence Living Arrangements: Spouse/significant other Available Help at Discharge: Family("most of the time" ) Type of Home: House Home Access: Stairs to enter Entrance Stairs-Rails: Left Entrance Stairs-Number of Steps: 3 Home Layout: One level Home Equipment: None      Prior Function Level of Independence: Independent               Hand Dominance        Extremity/Trunk Assessment   Upper Extremity Assessment Upper Extremity Assessment: Defer to OT evaluation  Lower Extremity Assessment Lower Extremity Assessment: Generalized weakness    Cervical / Trunk Assessment Cervical / Trunk Assessment: Normal  Communication   Communication: No difficulties  Cognition Arousal/Alertness: Awake/alert Behavior During Therapy: WFL for tasks assessed/performed Overall Cognitive Status: Impaired/Different from baseline Area of Impairment: Orientation;Memory;Following  commands;Safety/judgement;Problem solving                 Orientation Level: Disoriented to;Time   Memory: Decreased recall of precautions;Decreased short-term memory Following Commands: Follows one step commands with increased time Safety/Judgement: Decreased awareness of deficits;Decreased awareness of safety   Problem Solving: Slow processing General Comments: Pt reporting it was 1968. SLowed processing noted and required multimodal cues when performing mobility tasks.       General Comments      Exercises     Assessment/Plan    PT Assessment Patient needs continued PT services  PT Problem List Decreased strength;Decreased balance;Decreased mobility;Decreased cognition;Decreased knowledge of use of DME;Decreased safety awareness;Decreased knowledge of precautions       PT Treatment Interventions DME instruction;Gait training;Therapeutic activities;Functional mobility training;Stair training;Therapeutic exercise;Balance training;Cognitive remediation;Patient/family education    PT Goals (Current goals can be found in the Care Plan section)  Acute Rehab PT Goals Patient Stated Goal: to be able to walk better PT Goal Formulation: With patient Time For Goal Achievement: 01/16/19 Potential to Achieve Goals: Fair    Frequency Min 2X/week   Barriers to discharge        Co-evaluation               AM-PAC PT "6 Clicks" Mobility  Outcome Measure Help needed turning from your back to your side while in a flat bed without using bedrails?: A Little Help needed moving from lying on your back to sitting on the side of a flat bed without using bedrails?: A Little Help needed moving to and from a bed to a chair (including a wheelchair)?: A Lot Help needed standing up from a chair using your arms (e.g., wheelchair or bedside chair)?: A Lot Help needed to walk in hospital room?: Total Help needed climbing 3-5 steps with a railing? : Total 6 Click Score: 12    End of  Session Equipment Utilized During Treatment: Gait belt Activity Tolerance: Patient tolerated treatment well Patient left: in bed;with call bell/phone within reach;with nursing/sitter in room Nurse Communication: Mobility status PT Visit Diagnosis: Unsteadiness on feet (R26.81);Muscle weakness (generalized) (M62.81)    Time: 8115-7262 PT Time Calculation (min) (ACUTE ONLY): 12 min   Charges:   PT Evaluation $PT Eval Moderate Complexity: Rives, PT, DPT  Acute Rehabilitation Services  Pager: [phone] Office: [phone]   Rudean Hitt 01/02/2019, 11:02 AM

## 2019-01-02 NOTE — Care Management Important Message (Signed)
Important Message  Patient Details  Name: Eric Wade MRN: [id number] Date of Birth: 05-May-1941   Medicare Important Message Given:  Yes  Due to illness patient could not sign/  Unsigned copy left  At bedside.   Fabricio Endsley 01/02/2019, 3:09 PM

## 2019-01-02 NOTE — Progress Notes (Signed)
PROGRESS NOTE  Eric Wade KZL:[id number] DOB: 1940-10-09 DOA: 12/28/2018 PCP: [name], MD   Brief summary:  Per ED triage  "Per EMS pt lkw at approximately 1700 by family when he returned from taking his wife to dialysis.  Was found down sometime later by family in the bathroom having fallen from the toilet. C collar in place. Pt had left sided flacidity and left gaze with incontinence.  "  78 year old male with prior history of renal insufficiency, hypertension, hyperlipidemia, and DMT2 presenting from home with altered mental status.  Patient had been in his normal status of health, picking his wife up from dialysis, when he was found with altered mental status on bathroom floor.  LSW was 1800.  Presented as code stroke with EMS.  On arrival to ER around 2030, patient was flaccid on left with left gaze. He was afebrile, hypertensive with SBP ~210 and given labetalol with improvement.  CBG noted high.  Neurology present for code stroke.  Labs noted for corrected Na 140, glucose 706, CO2 24, sCr 2.04, AG normal, normal CBC and coags, ETOH neg, UDS pending.  Given concern for possible seizure, was given ativan 2 mg.  Taken to CT which did not show any acute findings.  TPA was then given.  CTA of head and neck was non acute.  He subsequently required intubation for airway protection in ER.  CXR pending. To be admitted by Neurology, PCCM consulted for ventilator management.    4/29 Admitted . Intubated 4/30 brady /hypotension with propofol >> fent gtt, dopamine 5/1 - Extubated 12/30/2018. Very agitated, confused, striking out at staff, aggressive with staff. + 2 L 5/3 on hospitalist service  HPI/Recap of past 24 hours:   He is aaox3 today, he remembered what he had for lunch Sitter at bedside due to he had a sun downing episode last night  Blood glucose elevated bp elevated  No fever, no sob  Assessment/Plan: Active Problems:   Insulin dependent diabetes mellitus ([place])  Stroke ([place])   Encephalopathy acute   Acute metabolic encephalopathy  Acute metabolic Encephalopathy of unclear etiology  -Possibly due to  uncontrolled diabetes, as daughter reports patient has not been taking care of himself, he has not been taking his bp and diabetes meds, patient does not have a glucometer, he does not check his blood sugar regularly. patient has been c/o feeling weak, peeing a lot, for the last months  -Patient presented with Strokelike symptoms with left gaze and left hemiplegia , initial concerns for seizure, he did not improved with ativan, he then received TPA on 4/29 9pm for presumed CVA MRI brain negative for CVA, CTA head & neck no LVO.  Distal vessel atherosclerosis CT head 24h post tPA no acute stroke. Chronic small vessel disease. Atrophy. 2D Echo EF 60-65%. No source of embolus  EEG neg for sz UDS negative    Ammonia level elevated at 109, lft wnl,  INR 0.9, mild fatty liver on ab [place],   He was intubated in the ED for airway protection, he is extubated on 5/1  -he is improving, today he is aaox4, ammonia level improved with lactulose  Dysphagia  -no prior history of this, currently on dysphagia diet III/nectar thick liquid per speech recommendation -aspiration precaution  Insulin dependent DM2, uncontrolled  Glucose on admission 706 Home meds: Lantus 40, Actos 30 HgbA1c 13.6, goal < 7.0 He is treated with insulin drip initially, currently on subQ insulin Appreciate diabetic RN input  HTN Noncompliance with meds per family meds bottles at home are old, he has not get any recent refills per family Will adjust bp meds, currently on cardizem, lisinopril, bp remain elevated will add on imdur   AKI on CKDIII  baseline 1.5 in 08/2017. Cr 1.82->1.31 Followed with nephrology     Hypokalemia/hypophosphatemia  Remain low, continue to replace  FFT: PT recommended SNF. Patient declined SNF , he states SNF " has too many rules"  Patient will return to  daughter's home , will need home health RN for disease management , family reports his is noncompliance with meds/diet prior to this.  Code Status: full  Family Communication: patient , daughter [name] over the phone daily  Disposition Plan:  Patient declined SNF Family can provide 24/7 care, family is comfortable to take him home Home with  home health on 5/5    Consultants:  Admitted to icu on 4/29, on hospitalist service on 5/3  Neurology, signed off on 5/2  Procedures:  Intubation/extubation  Antibiotics:  none   Objective: BP (!) 161/72 (BP Location: Right Arm)   Pulse (!) 51   Temp 97.6 F (36.4 C) (Axillary)   Resp 19   Ht _0  (1.854 m)   Wt 83.9 kg   SpO2 100%   BMI 24.40 kg/m   Intake/Output Summary (Last 24 hours) at 01/02/2019 1625 Last data filed at 01/02/2019 1230 Gross per 24 hour  Intake 480 ml  Output 1050 ml  Net -570 ml   Filed Weights   12/28/18 2000 12/28/18 2200 12/30/18 0441  Weight: 84.1 kg 82.5 kg 83.9 kg    Exam: Patient is examined daily including today on 01/02/2019, exams remain the same as of yesterday except that has changed    General:  NAD, aaox3, pleasant and calm currently  Cardiovascular: RRR  Respiratory: CTABL  Abdomen: Soft/ND/NT, positive BS  Musculoskeletal: No Edema  Neuro: alert, aaox3, pleasant and calm currently  Data Reviewed: Basic Metabolic Panel: Recent Labs  Lab 12/28/18 2031 12/28/18 2037 12/28/18 2142 12/30/18 0217 12/30/18 1413 12/31/18 0216 01/01/19 0209 01/02/19 0540  NA 130*  --  131* 140  --  143  --  141  K 4.0  --  3.8 3.1*  --  3.8  --  3.2*  CL 93*  --   --  105  --  113*  --  111  CO2 24  --   --  23  --  21*  --  19*  GLUCOSE 706*  --   --  193*  --  100*  --  221*  BUN 23  --   --  24*  --  13  --  14  CREATININE 2.04* 1.90*  --  1.82*  --  1.31*  --  1.47*  CALCIUM 9.3  --   --  9.2  --  8.8*  --  8.9  MG  --   --   --  2.0 2.0 2.1 2.1 1.9  PHOS  --   --   --  2.8 3.2   --  2.1* 1.8*   Liver Function Tests: Recent Labs  Lab 12/28/18 2031 12/30/18 0217 12/31/18 0216 01/01/19 0209  AST 23  --  39 35  ALT 11  --  25 21  ALKPHOS 97  --  58 54  BILITOT 1.1  --  0.9 0.9  PROT 7.4  --  5.7* 5.6*  ALBUMIN 4.0 3.4* 2.8* 2.7*   No results  for input(s): LIPASE, AMYLASE in the last 168 hours. Recent Labs  Lab 12/31/18 0216 01/02/19 0540  AMMONIA 109* 64*   CBC: Recent Labs  Lab 12/28/18 2031 12/28/18 2142 12/30/18 0217 12/31/18 0216 01/01/19 0209 01/02/19 0540  WBC 7.6  --  10.5 7.8 7.1 7.1  NEUTROABS 3.7  --   --   --  4.3 4.5  HGB 15.7 14.6 15.1 14.2 13.0 12.7*  HCT 47.6 43.0 43.3 43.0 38.7* 37.8*  MCV 90.8  --  86.6 91.3 88.8 88.5  PLT 167  --  157 117* 141* 126*   Cardiac Enzymes:   Recent Labs  Lab 12/28/18 2318 12/30/18 1413  TROPONINI <0.03 0.06*   BNP (last 3 results) No results for input(s): BNP in the last 8760 hours.  ProBNP (last 3 results) No results for input(s): PROBNP in the last 8760 hours.  CBG: Recent Labs  Lab 01/01/19 2339 01/02/19 0327 01/02/19 0824 01/02/19 1211 01/02/19 1606  GLUCAP 279* 316* 132* 174* 265*    Recent Results (from the past 240 hour(s))  MRSA PCR Screening     Status: None   Collection Time: 12/28/18 10:41 PM  Result Value Ref Range Status   MRSA by PCR NEGATIVE NEGATIVE Final    Comment:        The GeneXpert MRSA Assay (FDA approved for NASAL specimens only), is one component of a comprehensive MRSA colonization surveillance program. It is not intended to diagnose MRSA infection nor to guide or monitor treatment for MRSA infections. Performed at [facility], [place] [address], [place], [place] [address]      Studies: No results found.  Scheduled Meds: . aspirin EC  81 mg Oral Daily   Or  . aspirin  300 mg Rectal Daily  . atorvastatin  20 mg Oral q1800  . chlorhexidine  15 mL Mouth Rinse BID  . diltiazem  240 mg Oral Daily  . enoxaparin (LOVENOX) injection  40  mg Subcutaneous Q24H  . haloperidol lactate  5 mg Intramuscular Once  . insulin aspart  0-15 Units Subcutaneous Q4H  . insulin detemir  6 Units Subcutaneous BID  . insulin starter kit- syringes  1 kit Other Once  . lactulose  10 g Oral TID  . lisinopril  20 mg Oral Daily  . mouth rinse  15 mL Mouth Rinse q12n4p  . multivitamin with minerals  1 tablet Per Tube Daily  . pantoprazole  40 mg Oral Daily  . phosphorus  250 mg Oral Daily    Continuous Infusions: . lactated ringers 75 mL/hr at 01/02/19 1683     Time spent: 78mns I have personally reviewed and interpreted on  01/02/2019 daily labs, tele strips, imagings as discussed above under date review session and assessment and plans.  I reviewed all nursing notes, pharmacy notes, consultant notes,  vitals, pertinent old records  I have discussed plan of care as described above with RN , patient and family on 01/02/2019   FFlorencia ReasonsMD, PhD  [facility] Pager [phone] If 7PM-7AM, please contact night-coverage at [email or web address], password [email or web address]/11/2018, 4:25 PM  LOS: 5 days

## 2019-01-02 NOTE — Progress Notes (Signed)
SLP Cancellation Note  Patient Details Name: JAMALE SPANGLER MRN: [id number] DOB: [birth year]   Cancelled treatment:       Reason Eval/Treat Not Completed: Fatigue/lethargy limiting ability to participate(pt just fell asleep per RN after significant agitation, will continue efforts)   Macario Golds 01/02/2019, 7:58 AM  Luanna Salk, [place] SLP Acute Rehab Services Pager [phone] Office [phone]

## 2019-01-02 NOTE — Progress Notes (Addendum)
Nutrition Follow-up   RD working remotely.  DOCUMENTATION CODES:   Not applicable  INTERVENTION:  Continue dysphagia 3 diet with nectar thick liquids.   Encourage adequate PO intake.   Diet education handout regarding diabetes attached to discharge instructions.   NUTRITION DIAGNOSIS:   Inadequate oral intake related to inability to eat as evidenced by NPO status; diet advanced; improved  GOAL:   Patient will meet greater than or equal to 90% of their needs; met  MONITOR:   PO intake, Diet advancement, Weight trends, Labs, Skin, I & O's  REASON FOR ASSESSMENT:   Ventilator, Consult Enteral/tube feeding initiation and management  ASSESSMENT:   78 yo male with PMH of DM-2, HLD, HTN, renal insufficiency, and prostate cancer who was admitted with AMS, glucose 706, code stroke. S/P TPA. Required intubation. CT head with no acute findings. MRI showed chronic small vessel disease with chronic lacunar infarcts.   Extubated 5/1. Diet has been advanced to a dysphagia 3 diet with nectar thick liquids. Meal completion has been 100%. RD consulted for diet education regarding diabetes. Pt fatigued and lethargic today. RD attached diet education handout "Carbohydrate Counting for People with Diabetes" from the Academy of Nutrition and Dietetics Manual in discharge instructions. Labs and medications reviewed.   Diet Order:   Diet Order            DIET DYS 3 Room service appropriate? Yes; Fluid consistency: Nectar Thick  Diet effective now              EDUCATION NEEDS:   Education needs have been addressed  Skin:  Skin Assessment: Reviewed RN Assessment  Last BM:  Unknown  Height:   Ht Readings from Last 1 Encounters:  12/28/18 '6\' 1"'  (1.854 m)    Weight:   Wt Readings from Last 1 Encounters:  12/30/18 83.9 kg    Ideal Body Weight:  83.6 kg  BMI:  Body mass index is 24.4 kg/m.  Estimated Nutritional Needs:   Kcal:  7322-0254  Protein:  90-100 grams    Fluid:  1.9 - 2 L/day   Corrin Parker, MS, RD, LDN Pager # [phone] After hours/ weekend pager # [phone]

## 2019-01-03 ENCOUNTER — Encounter (HOSPITAL_COMMUNITY): Payer: Self-pay

## 2019-01-03 LAB — CBC WITH DIFFERENTIAL/PLATELET
Abs Immature Granulocytes: 0.04 10*3/uL (ref 0.00–0.07)
Basophils Absolute: 0 10*3/uL (ref 0.0–0.1)
Basophils Relative: 0 %
Eosinophils Absolute: 0.2 10*3/uL (ref 0.0–0.5)
Eosinophils Relative: 2 %
HCT: 35.4 % — ABNORMAL LOW (ref 39.0–52.0)
Hemoglobin: 11.8 g/dL — ABNORMAL LOW (ref 13.0–17.0)
Immature Granulocytes: 0 %
Lymphocytes Relative: 22 %
Lymphs Abs: 2.2 10*3/uL (ref 0.7–4.0)
MCH: 29.6 pg (ref 26.0–34.0)
MCHC: 33.3 g/dL (ref 30.0–36.0)
MCV: 88.9 fL (ref 80.0–100.0)
Monocytes Absolute: 1.1 10*3/uL — ABNORMAL HIGH (ref 0.1–1.0)
Monocytes Relative: 11 %
Neutro Abs: 6.5 10*3/uL (ref 1.7–7.7)
Neutrophils Relative %: 65 %
Platelets: 177 10*3/uL (ref 150–400)
RBC: 3.98 MIL/uL — ABNORMAL LOW (ref 4.22–5.81)
RDW: 13.2 % (ref 11.5–15.5)
WBC: 10 10*3/uL (ref 4.0–10.5)
nRBC: 0 % (ref 0.0–0.2)

## 2019-01-03 LAB — GLUCOSE, CAPILLARY
Glucose-Capillary: 185 mg/dL — ABNORMAL HIGH (ref 70–99)
Glucose-Capillary: 303 mg/dL — ABNORMAL HIGH (ref 70–99)

## 2019-01-03 LAB — BASIC METABOLIC PANEL
Anion gap: 8 (ref 5–15)
BUN: 10 mg/dL (ref 8–23)
CO2: 23 mmol/L (ref 22–32)
Calcium: 8.6 mg/dL — ABNORMAL LOW (ref 8.9–10.3)
Chloride: 110 mmol/L (ref 98–111)
Creatinine, Ser: 1.31 mg/dL — ABNORMAL HIGH (ref 0.61–1.24)
GFR calc Af Amer: 59 mL/min — ABNORMAL LOW (ref >60)
GFR calc non Af Amer: 51 mL/min — ABNORMAL LOW (ref >60)
Glucose, Bld: 139 mg/dL — ABNORMAL HIGH (ref 70–99)
Potassium: 3.2 mmol/L — ABNORMAL LOW (ref 3.5–5.1)
Sodium: 141 mmol/L (ref 135–145)

## 2019-01-03 LAB — LACTIC ACID, PLASMA: Lactic Acid, Venous: 1.1 mmol/L (ref 0.5–1.9)

## 2019-01-03 LAB — MAGNESIUM: Magnesium: 1.9 mg/dL (ref 1.7–2.4)

## 2019-01-03 LAB — PHOSPHORUS: Phosphorus: 1.8 mg/dL — ABNORMAL LOW (ref 2.5–4.6)

## 2019-01-03 MED ORDER — INSULIN NPH ISOPHANE & REGULAR (70-30) 100 UNIT/ML ~~LOC~~ SUSP: 8.0000 [IU] | Freq: Two times a day (BID) | SUBCUTANEOUS | 0 refills | Status: DC

## 2019-01-03 MED ORDER — BLOOD GLUCOSE MONITOR KIT: PACK | 0 refills | Status: DC

## 2019-01-03 MED ORDER — BLOOD GLUCOSE MONITOR KIT: PACK | 0 refills | Status: AC

## 2019-01-03 MED ORDER — LACTULOSE 10 GM/15ML PO SOLN: 10.0000 g | Freq: Every day | ORAL | 0 refills | Status: DC

## 2019-01-03 MED ORDER — ATORVASTATIN CALCIUM 20 MG PO TABS: 20.0000 mg | ORAL_TABLET | Freq: Every day | ORAL | 0 refills | Status: DC

## 2019-01-03 MED ORDER — INSULIN STARTER KIT- SYRINGES (ENGLISH): 1.0000 | Freq: Once | 0 refills | Status: AC

## 2019-01-03 MED ORDER — RESOURCE THICKENUP CLEAR PO POWD: ORAL | 0 refills | Status: DC

## 2019-01-03 MED ORDER — ISOSORBIDE MONONITRATE ER 30 MG PO TB24: 30.0000 mg | ORAL_TABLET | Freq: Every day | ORAL | 0 refills | Status: DC

## 2019-01-03 MED ORDER — POTASSIUM CHLORIDE CRYS ER 20 MEQ PO TBCR
40.0000 meq | EXTENDED_RELEASE_TABLET | Freq: Once | ORAL | Status: AC
  Administered 2019-01-03: 40 meq via ORAL
  Filled 2019-01-03: qty 2

## 2019-01-03 MED ORDER — HYDRALAZINE HCL 25 MG PO TABS: 25.0000 mg | ORAL_TABLET | Freq: Three times a day (TID) | ORAL | 0 refills | Status: DC

## 2019-01-03 NOTE — Progress Notes (Signed)
D/C instructions given to pt.'s daughter [name] via telephone, she verbalized understanding.   Pt.'s envelope returned from security and given to pt with daughter witnessing at discharge. Dtr. Also handed discharge instructions/prescriptions.

## 2019-01-03 NOTE — Progress Notes (Signed)
CM spoke to patients daughter late yesterday. She is planning on bringing the patient home at d/c. She states he will need DME and [place] services. CM following.

## 2019-01-03 NOTE — TOC Transition Note (Signed)
Transition of Care [facility]) - CM/SW Discharge Note   Patient Details  Name: Eric Wade MRN: [id number] Date of Birth: 1940/10/17  Transition of Care [facility]) CM/SW Contact:  Pollie Friar, RN Phone Number: 01/03/2019, 1:51 PM   Clinical Narrative:    Pt discharging home with his daughter. Pt will have prescription for glucose meter and supplies requested per daughter.  Daughter aware of d/c and will call bedside RN to see about timing of pickup.  Daughter to transport home.   Final next level of care: Home w Home Health Services Barriers to Discharge: Barriers Resolved   Patient Goals and CMS Choice Patient states their goals for this hospitalization and ongoing recovery are:: spoke w daughter who wishes for patient to live with her at DC [facility] Medicare.gov Compare Post Acute Care list provided to:: Patient Represenative (must comment)(daughter) Choice offered to / list presented to : Adult Children  Discharge Placement                       Discharge Plan and Services   Discharge Planning Services: CM Consult Post Acute Care Choice: Home Health          DME Arranged: 3-N-1, [place] rolling DME Agency: [employer] Date DME Agency Contacted: 01/03/19 Time DME Agency Contacted: [id number] Representative spoke with at DME Agency: [place]: RN, PT, OT, Nurse's Aide, Speech Therapy [place] Agency: [facility] (formerly [employer]) Date [place]: 01/03/19 Time [place]: [place] Representative spoke with at [place]: [place] ([place]) Interventions     Readmission Risk Interventions No flowsheet data found.

## 2019-01-03 NOTE — Discharge Summary (Signed)
Discharge Summary  Eric Wade RCV:[id number] DOB: 1941-07-10  PCP: [name], MD  Admit date: 12/28/2018 Discharge date: 01/03/2019  Time spent: 49mns, more than 50% time spent on coordination of care.  Recommendations for Outpatient Follow-up:  1. F/u with PCP within a week  for hospital discharge follow up, repeat cbc/bmp at follow up 2. Home health PT/OT/speech/RN/Aids  Discharge Diagnoses:  Active Hospital Problems   Diagnosis Date Noted   Hypokalemia    Hypophosphatemia    Acute metabolic encephalopathy    Encephalopathy acute    [place] ([facility] [place]   Insulin dependent diabetes mellitus ([facility] 05/08/2007    Resolved Hospital Problems  No resolved problems to display.    Discharge Condition: stable  Diet recommendation: heart healthy/carb modified, dysphagia III diet, nectar thick liquid, aspiration precuation  Filed Weights   12/28/18 2000 12/28/18 2200 12/30/18 0441  Weight: 84.1 kg 82.5 kg 83.9 kg    History of present illness:  Per ED triage  "Per EMS pt lkw at approximately 1700 by family when he returned from taking his wife to dialysis. Was found down sometime later by family in the bathroom having fallen from the toilet. C collar in place. Pt had left sided flacidity and left gaze with incontinence. "  79year old male with prior history of renal insufficiency, hypertension, hyperlipidemia, and DMT2 presenting from home with altered mental status.  Patient had been in his normal status of health, picking his wife up from dialysis, when he was found with altered mental status on bathroom floor. LSW was 1800. Presented as code stroke with EMS.  On arrival to ER around 2030, patient was flaccid on left with left gaze. He was afebrile, hypertensive with SBP ~210 and given labetalol with improvement. CBG noted high. Neurology present for code stroke. Labs noted for corrected Na 140, glucose 706, CO2 24, sCr 2.04, AG normal, normal CBC and  coags, ETOH neg, UDS pending. Given concern for possible seizure, was given ativan 2 mg. Taken to CT which did not show any acute findings. TPA was then given. CTA of head and neck was non acute. He subsequently required intubation for airway protection in ER. CXR pending. To be admitted by Neurology, PCCM consulted for ventilator management.    4/29 Admitted. Intubated 4/30 brady /hypotension with propofol >> fent gtt, dopamine 5/1 -Extubated 12/30/2018.Very agitated, confused, striking out at staff, aggressive with staff.+ 2 L 5/3 on hospitalist service   Hospital Course:  Active Problems:   Insulin dependent diabetes mellitus (HCC)   Stroke (HCC)   Encephalopathy acute   Acute metabolic encephalopathy   Hypokalemia   Hypophosphatemia   Acute metabolic Encephalopathy of unclear etiology  -Possibly due to  uncontrolled diabetes,  -as per daughter patient has not been taking care of himself, he has not been taking his bp and diabetes meds, patient does not have a glucometer, he does not check his blood sugar regularly. patient has been c/o feeling weak, peeing a lot, for the last months  -Patient presented with Strokelike symptomswith left gaze and left hemiplegia, initial concerns for seizure, he did not improved with ativan, he then received TPA on 4/29 9pm for presumed CVA MRI brain negative for CVA, CTA head &neck no LVO. Distal vessel atherosclerosis CT head 24h post tPA no acute stroke. Chronic small vessel disease. Atrophy. 2D EchoEF 60-65%. No source of embolus EEG neg for sz UDS negative   Ammonia level elevated at 109, lft wnl,  INR 0.9, mild  fatty liver on ab [place],   He was intubated in the ED for airway protection, he is extubated on 5/1  -improved, mental  he is aaox4, ammonia level improved with lactulose, he is discharged home on lactulose, unclear etiology of elevated ammonia level, follow up with pcp, repeat ammonia level  Dysphagia  -no  prior history of this, currently on dysphagia diet III/nectar thick liquid per speech recommendation -aspiration precaution  Insulin dependent DM2, uncontrolled with hyperglycemia Glucose on admission 706 Home meds: Lantus 40, Actos 30 HgbA1c 13.6, goal < 7.0 He is treated with insulin drip initially, transitioned to subQ insulin Appreciate diabetic RN input  HTN Noncompliance with meds per family meds bottles at home are old, he has not get any recent refills per family He is discharged on cardizem, lisinopril, imdur, hydralazine -patient appear dehydrated on presentation, he received hydration, euvolemic at discharge, discontinue laxis   AKI on CKDIII  baseline 1.5 in 08/2017. Cr 1.82->1.31 Followed with nephrology     Hypokalemia/hypophosphatemia   replaced  FFT: PT recommended SNF. Patient declined SNF , he states SNF " has too many rules"  Patient will return to daughter's home , will need home health RN for disease management , family reports his is noncompliance with meds/diet prior to this.  Code Status: full  Family Communication: patient , daughter [name] over the phone daily  Disposition Plan:  Patient declined SNF Family can provide 24/7 care, family is comfortable to take him home Home with  home health on 5/5    Consultants:  Admitted to icu on 4/29, on hospitalist service on 5/3  Neurology, signed off on 5/2  Procedures:  Intubation/extubation  Midline placement and removal  Antibiotics:  none  Discharge Exam: BP (!) 148/67 (BP Location: Right Arm)    Pulse 63    Temp 99.8 F (37.7 C) (Oral)    Resp 18    Ht '6\' 1"'  (1.854 m)    Wt 83.9 kg    SpO2 99%    BMI 24.40 kg/m   General: weak, NAD, AAOX3 Cardiovascular: RRR Respiratory: CTABL  Discharge Instructions You were cared for by a hospitalist during your hospital stay. If you have any questions about your discharge medications or the care you received while you were in  the hospital after you are discharged, you can call the unit and asked to speak with the hospitalist on call if the hospitalist that took care of you is not available. Once you are discharged, your primary care physician will handle any further medical issues. Please note that NO REFILLS for any discharge medications will be authorized once you are discharged, as it is imperative that you return to your primary care physician (or establish a relationship with a primary care physician if you do not have one) for your aftercare needs so that they can reassess your need for medications and monitor your lab values.  Discharge Instructions    Diet - low sodium heart healthy   Complete by:  As directed    Carb modified Dysphagia three diet, nectar thick liquid Aspiration precaution   Increase activity slowly   Complete by:  As directed      Allergies as of 01/03/2019      Reactions   Nsaids    REACTION: renal insufficiency      Medication List    STOP taking these medications   amLODipine 10 MG tablet Commonly known as:  NORVASC   furosemide 40 MG tablet Commonly known as:  LASIX   Insulin Glargine 100 UNIT/ML Solostar Pen Commonly known as:  LANTUS   pioglitazone 30 MG tablet Commonly known as:  ACTOS     TAKE these medications   aspirin 81 MG EC tablet Take 81 mg by mouth daily.   atorvastatin 20 MG tablet Commonly known as:  LIPITOR Take 1 tablet (20 mg total) by mouth daily at 6 PM.   Auto-Lancet Misc Pt received from [facility]   blood glucose meter kit and supplies Kit Pt receives from [employer] What changed:  Another medication with the same name was added. Make sure you understand how and when to take each.   blood glucose meter kit and supplies Kit Dispense based on patient and insurance preference. Use up to four times daily as directed. (FOR ICD-9 250.00, 250.01). What changed:  You were already taking a medication with the same name, and this prescription was  added. Make sure you understand how and when to take each.   Dilt-XR 240 MG 24 hr capsule Generic drug:  diltiazem TAKE 1 CAPSULE (240 MG TOTAL) BY MOUTH DAILY.   hydrALAZINE 25 MG tablet Commonly known as:  APRESOLINE Take 1 tablet (25 mg total) by mouth 3 (three) times daily.   insulin NPH-regular Human (70-30) 100 UNIT/ML injection Inject 8 Units into the skin 2 (two) times daily with a meal. What changed:    how much to take  when to take this   insulin starter kit- syringes Misc 1 kit by Other route once for 1 dose.   isosorbide mononitrate 30 MG 24 hr tablet Commonly known as:  IMDUR Take 1 tablet (30 mg total) by mouth daily. Start taking on:  Jan 04, 2019   lactulose 10 GM/15ML solution Commonly known as:  CHRONULAC Take 15 mLs (10 g total) by mouth daily.   lisinopril 20 MG tablet Commonly known as:  ZESTRIL TAKE 1 TABLET EVERY DAY   Resource ThickenUp Clear Powd For nectar thick liquid   True Metrix Blood Glucose Test test strip Generic drug:  glucose blood TEST AS DIRECTED TWICE PER DAY            Durable Medical Equipment  (From admission, onward)         Start     Ordered   01/03/19 1254  For home use only DME Walker rolling  Once    Question:  Patient needs a walker to treat with the following condition  Answer:  Stroke ([place])   01/03/19 1253   01/03/19 1253  For home use only DME 3 n 1  Once     01/03/19 1253         Allergies  Allergen Reactions   Nsaids     REACTION: renal insufficiency   Follow-up Information    [name], MD Follow up in 1 week(s).   Specialties:  Internal Medicine, Radiology Why:  hospital discharge follow up, repeat cbc/bmp/ammonia level please check your blood pressure and blood glucose at home, bring your doctor the record, pcp to further adjust bp and diabetes meds accordingly Contact information: [place] [address] [phone]            The results of significant  diagnostics from this hospitalization (including imaging, microbiology, ancillary and laboratory) are listed below for reference.    Significant Diagnostic Studies: Ct Angio Head W Or Wo Contrast  Result Date: 12/28/2018 CLINICAL DATA:  Acute presentation with confusion and left-sided weakness. EXAM: CT  ANGIOGRAPHY HEAD AND NECK CT PERFUSION BRAIN TECHNIQUE: Multidetector CT imaging of the head and neck was performed using the standard protocol during bolus administration of intravenous contrast. Multiplanar CT image reconstructions and MIPs were obtained to evaluate the vascular anatomy. Carotid stenosis measurements (when applicable) are obtained utilizing NASCET criteria, using the distal internal carotid diameter as the denominator. Multiphase CT imaging of the brain was performed following IV bolus contrast injection. Subsequent parametric perfusion maps were calculated using RAPID software. CONTRAST:  142m OMNIPAQUE IOHEXOL 350 MG/ML SOLN COMPARISON:  Head CT same day FINDINGS: CTA NECK FINDINGS Aortic arch: Atherosclerotic calcification. No aneurysm or dissection. Branching pattern is normal. Right carotid system: Common carotid artery widely patent to the bifurcation. Atherosclerotic calcification at the carotid bifurcation and ICA bulb but no stenosis. Cervical ICA widely patent. Left carotid system: Common carotid artery widely patent to the bifurcation. Calcified plaque at the bifurcation but no stenosis. Cervical ICA widely patent beyond that. Vertebral arteries: Both vertebral artery origins are widely patent. Both vertebral arteries appear patent and normal through the cervical region to the foramen magnum. Skeleton: Ordinary cervical spondylosis. Other neck: No mass or lymphadenopathy. Upper chest: Emphysema and pulmonary scarring.  No active process. Review of the MIP images confirms the above findings CTA HEAD FINDINGS Anterior circulation: Both internal carotid arteries are widely patent  through the skull base and siphon regions. There is siphon calcification but no stenosis greater than 20%. The anterior and middle cerebral vessels are patent without proximal stenosis, aneurysm or vascular malformation. No occluded large or medium vessels are identified. Distal vessels do show some atherosclerotic irregularity. Posterior circulation: Both vertebral arteries widely patent to the basilar. No basilar stenosis. Posterior circulation branch vessels are normal. Distal vessels do show some atherosclerotic irregularity. Venous sinuses: Patent and normal. Anatomic variants: None significant. Delayed phase: Not performed Review of the MIP images confirms the above findings CT Brain Perfusion Findings: ASPECTS: 10 CBF (<30%) Volume: 025mPerfusion (Tmax>6.0s) volume: 64m81mismatch Volume: 64mL23mfarction Location:None IMPRESSION: No large or medium vessel occlusion.  Negative perfusion study. No carotid bifurcation stenosis.  No posterior circulation stenosis. Distal vessel atherosclerotic irregularity. Electronically Signed   By: MarkNelson Chimes.   On: 12/28/2018 21:07   Ct Head Wo Contrast  Result Date: 12/29/2018 CLINICAL DATA:  Stroke follow-up EXAM: CT HEAD WITHOUT CONTRAST TECHNIQUE: Contiguous axial images were obtained from the base of the skull through the vertex without intravenous contrast. COMPARISON:  Head CT 12/28/2018 FINDINGS: Brain: There is no mass, hemorrhage or extra-axial collection. There is generalized atrophy without lobar predilection. Hypodensity of the white matter is most commonly associated with chronic microvascular disease. Vascular: No abnormal hyperdensity of the major intracranial arteries or dural venous sinuses. No intracranial atherosclerosis. Skull: The visualized skull base, calvarium and extracranial soft tissues are normal. Sinuses/Orbits: No fluid levels or advanced mucosal thickening of the visualized paranasal sinuses. No mastoid or middle ear effusion. The orbits  are normal. IMPRESSION: Chronic small vessel ischemia and volume loss without acute intracranial abnormality. Electronically Signed   By: KeviUlyses Jarred.   On: 12/29/2018 21:32   Ct Angio Neck W Or Wo Contrast  Result Date: 12/28/2018 CLINICAL DATA:  Acute presentation with confusion and left-sided weakness. EXAM: CT ANGIOGRAPHY HEAD AND NECK CT PERFUSION BRAIN TECHNIQUE: Multidetector CT imaging of the head and neck was performed using the standard protocol during bolus administration of intravenous contrast. Multiplanar CT image reconstructions and MIPs were obtained to evaluate the vascular anatomy. Carotid  stenosis measurements (when applicable) are obtained utilizing NASCET criteria, using the distal internal carotid diameter as the denominator. Multiphase CT imaging of the brain was performed following IV bolus contrast injection. Subsequent parametric perfusion maps were calculated using RAPID software. CONTRAST:  1765m OMNIPAQUE IOHEXOL 350 MG/ML SOLN COMPARISON:  Head CT same day FINDINGS: CTA NECK FINDINGS Aortic arch: Atherosclerotic calcification. No aneurysm or dissection. Branching pattern is normal. Right carotid system: Common carotid artery widely patent to the bifurcation. Atherosclerotic calcification at the carotid bifurcation and ICA bulb but no stenosis. Cervical ICA widely patent. Left carotid system: Common carotid artery widely patent to the bifurcation. Calcified plaque at the bifurcation but no stenosis. Cervical ICA widely patent beyond that. Vertebral arteries: Both vertebral artery origins are widely patent. Both vertebral arteries appear patent and normal through the cervical region to the foramen magnum. Skeleton: Ordinary cervical spondylosis. Other neck: No mass or lymphadenopathy. Upper chest: Emphysema and pulmonary scarring.  No active process. Review of the MIP images confirms the above findings CTA HEAD FINDINGS Anterior circulation: Both internal carotid arteries are  widely patent through the skull base and siphon regions. There is siphon calcification but no stenosis greater than 20%. The anterior and middle cerebral vessels are patent without proximal stenosis, aneurysm or vascular malformation. No occluded large or medium vessels are identified. Distal vessels do show some atherosclerotic irregularity. Posterior circulation: Both vertebral arteries widely patent to the basilar. No basilar stenosis. Posterior circulation branch vessels are normal. Distal vessels do show some atherosclerotic irregularity. Venous sinuses: Patent and normal. Anatomic variants: None significant. Delayed phase: Not performed Review of the MIP images confirms the above findings CT Brain Perfusion Findings: ASPECTS: 10 CBF (<30%) Volume: 049mPerfusion (Tmax>6.0s) volume: 65m55mismatch Volume: 65mL665mfarction Location:None IMPRESSION: No large or medium vessel occlusion.  Negative perfusion study. No carotid bifurcation stenosis.  No posterior circulation stenosis. Distal vessel atherosclerotic irregularity. Electronically Signed   By: MarkNelson Chimes.   On: 12/28/2018 21:07   Mr Brain Wo Contrast  Result Date: 12/29/2018 CLINICAL DATA:  78 y68r old male code stroke. Confusion and left side weakness. EXAM: MRI HEAD WITHOUT CONTRAST TECHNIQUE: Multiplanar, multiecho pulse sequences of the brain and surrounding structures were obtained without intravenous contrast. COMPARISON:  CTA head and neck, CT perfusion 12/28/2018. FINDINGS: Brain: No restricted diffusion to suggest acute infarction. No midline shift, mass effect, evidence of mass lesion, ventriculomegaly, extra-axial collection or acute intracranial hemorrhage. Cervicomedullary junction and pituitary are within normal limits. Patchy and confluent cerebral white matter T2 and FLAIR hyperintensity, mostly periventricular. Chronic microhemorrhage in the posterior right temporal lobe on series 14, image 32. Smaller chronic microhemorrhages at the  posterior left operculum on image 31. Similar chronic microhemorrhage in the left occipital lobe on image 27. No cortical encephalomalacia identified. Mild T2 heterogeneity in the bilateral deep gray matter nuclei. More moderate T2 signal abnormality in the pons on series 10, image 9 resembles chronic lacunar infarct. Negative cerebellum. Vascular: Major intracranial vascular flow voids are grossly preserved; rapid T2 weighted imaging was performed. Skull and upper cervical spine: Partially visible degenerative cervical spinal stenosis. Normal bone marrow signal. Sinuses/Orbits: Negative orbits, postoperative changes to the right globe. Paranasal Visualized paranasal sinuses and mastoids are stable and well pneumatized. Other: Patient is intubated and fluid is layering in the pharynx. Scalp and face soft tissues appear negative. IMPRESSION: 1.  No acute intracranial abnormality. 2. Chronic small vessel disease including chronic appearing lacunar infarcts in pons, deep gray matter, and a few scattered  chronic microhemorrhages. Electronically Signed   By: Genevie Ann M.D.   On: 12/29/2018 02:17   Ct Cerebral Perfusion W Contrast  Result Date: 12/28/2018 CLINICAL DATA:  Acute presentation with confusion and left-sided weakness. EXAM: CT ANGIOGRAPHY HEAD AND NECK CT PERFUSION BRAIN TECHNIQUE: Multidetector CT imaging of the head and neck was performed using the standard protocol during bolus administration of intravenous contrast. Multiplanar CT image reconstructions and MIPs were obtained to evaluate the vascular anatomy. Carotid stenosis measurements (when applicable) are obtained utilizing NASCET criteria, using the distal internal carotid diameter as the denominator. Multiphase CT imaging of the brain was performed following IV bolus contrast injection. Subsequent parametric perfusion maps were calculated using RAPID software. CONTRAST:  155m OMNIPAQUE IOHEXOL 350 MG/ML SOLN COMPARISON:  Head CT same day FINDINGS:  CTA NECK FINDINGS Aortic arch: Atherosclerotic calcification. No aneurysm or dissection. Branching pattern is normal. Right carotid system: Common carotid artery widely patent to the bifurcation. Atherosclerotic calcification at the carotid bifurcation and ICA bulb but no stenosis. Cervical ICA widely patent. Left carotid system: Common carotid artery widely patent to the bifurcation. Calcified plaque at the bifurcation but no stenosis. Cervical ICA widely patent beyond that. Vertebral arteries: Both vertebral artery origins are widely patent. Both vertebral arteries appear patent and normal through the cervical region to the foramen magnum. Skeleton: Ordinary cervical spondylosis. Other neck: No mass or lymphadenopathy. Upper chest: Emphysema and pulmonary scarring.  No active process. Review of the MIP images confirms the above findings CTA HEAD FINDINGS Anterior circulation: Both internal carotid arteries are widely patent through the skull base and siphon regions. There is siphon calcification but no stenosis greater than 20%. The anterior and middle cerebral vessels are patent without proximal stenosis, aneurysm or vascular malformation. No occluded large or medium vessels are identified. Distal vessels do show some atherosclerotic irregularity. Posterior circulation: Both vertebral arteries widely patent to the basilar. No basilar stenosis. Posterior circulation branch vessels are normal. Distal vessels do show some atherosclerotic irregularity. Venous sinuses: Patent and normal. Anatomic variants: None significant. Delayed phase: Not performed Review of the MIP images confirms the above findings CT Brain Perfusion Findings: ASPECTS: 10 CBF (<30%) Volume: 093mPerfusion (Tmax>6.0s) volume: 63m45mismatch Volume: 63mL82mfarction Location:None IMPRESSION: No large or medium vessel occlusion.  Negative perfusion study. No carotid bifurcation stenosis.  No posterior circulation stenosis. Distal vessel atherosclerotic  irregularity. Electronically Signed   By: MarkNelson Chimes.   On: 12/28/2018 21:07   Dg Chest Port 1 View  Result Date: 12/31/2018 CLINICAL DATA:  Respiratory failure. EXAM: PORTABLE CHEST 1 VIEW COMPARISON:  Chest x-ray dated December 28, 2018. FINDINGS: Interval removal of the endotracheal and enteric tubes. The heart size and mediastinal contours are within normal limits. Normal pulmonary vascularity. Improved aeration at the left lung base. No focal consolidation, pleural effusion, or pneumothorax. No acute osseous abnormality. IMPRESSION: Improved left basilar atelectasis. Electronically Signed   By: WillTitus Dubin.   On: 12/31/2018 06:59   Dg Chest Port 1 View  Result Date: 12/28/2018 CLINICAL DATA:  Acute stroke. Endotracheal tube nasogastric tube placement. EXAM: PORTABLE CHEST 1 VIEW COMPARISON:  None. FINDINGS: Endotracheal tube and nasogastric tube are seen in appropriate position. No evidence of pneumothorax. Atelectasis or consolidation is seen in the left lung base. Right lung is clear. IMPRESSION: 1. Endotracheal tube and nasogastric tube in appropriate position. 2. Left basilar atelectasis versus consolidation. Electronically Signed   By: JohnEarle Gell.   On: 12/28/2018 22:44  Dg Swallowing Func-speech Pathology  Result Date: 12/31/2018 Objective Swallowing Evaluation: Type of Study: MBS-Modified Barium Swallow Study  Patient Details Name: VIVIANO BIR MRN: [id number] Date of Birth: [birth year] Today's Date: 12/31/2018 Time: SLP Start Time (ACUTE ONLY): 1000 -SLP Stop Time (ACUTE ONLY): 1020 SLP Time Calculation (min) (ACUTE ONLY): 20 min Past Medical History: Past Medical History: Diagnosis Date  Allergic rhinitis 08/25/2016  DIABETES MELLITUS, TYPE II 05/08/2007  ERECTILE DYSFUNCTION 05/08/2007  GOUT 07/01/2007  HYPERLIPIDEMIA 05/08/2007  HYPERTENSION 05/08/2007  OBESITY 05/08/2007  Preventative health care 12/28/2010  Prostate cancer ([place]) 07/02/2008  Qualifier: Diagnosis of  By: Jenny Reichmann  MD, Hunt Oris   PSA, INCREASED 07/02/2008  RENAL INSUFFICIENCY 07/01/2007 Past Surgical History: No past surgical history on file. HPI: Patient presented with unresponsiveness left hemiplegia and left gaze deviation.  It was unclear whether he had a seizure with postictal deficits or an acute stroke.  After he failed to improve with Ativan he was given IV TPA but subsequently required intubation for airway protection.  He became hypotensive and bradycardic on propofol and fentanyl requiring dopamine to be started. he seems to be improving but it appears to be encephalopathic and is moving all 4 extremities this morning left side perhaps slightly less than right but he is not following commands.  MRI of brain without acute intracranial abnormalities. Chronic small vessel disease including chronic appearing lacunar infarcts in pons, deep gray matter, and a few scattered chronic microhemorrhages. Intubated 4/29 to 5/1  Subjective: alert upright in chair for procedure Assessment / Plan / Recommendation CHL IP CLINICAL IMPRESSIONS 12/31/2018 Clinical Impression Pt presents with a mild oral and mild pharyngeal dysphagia suspected to be in setting of short term mechanical ventiliation and decondtioning upon this admission. Oral deficits c/b prolonged mastication of solid PO with delayed oral transit and mild oral residuals (pt has upper and lower dentures in room however those needed extensive cleaning and were not utilized during MBSS). Pharyngeal deficits c/b reduced timing and efficiency of laryngeal vestibule closure resutling in: -trace penetration of thin liquids via cup sip -audible aspiration of thin liquids via straw use -trace transient penetration exhibited x1 with nectar thick liquids during barium tablet series. Mild vallecular residuals noted with puree an solid PO in setting of decreased base of tongue retraction that cleared with additional swallows. Recommend dysphagia 3( mechanical soft) and nectar thick  liquids with medicines whole in puree.   Anticipate continued progress in swallow recovery with extended time post extubation.  SLP Visit Diagnosis Dysphagia, oropharyngeal phase (R13.12) Attention and concentration deficit following -- Frontal lobe and executive function deficit following -- Impact on safety and function Mild aspiration risk   CHL IP TREATMENT RECOMMENDATION 12/31/2018 Treatment Recommendations Therapy as outlined in treatment plan below   Prognosis 12/31/2018 Prognosis for Safe Diet Advancement Good Barriers to Reach Goals Time post onset Barriers/Prognosis Comment -- CHL IP DIET RECOMMENDATION 12/31/2018 SLP Diet Recommendations Dysphagia 3 (Mech soft) solids;Nectar thick liquid Liquid Administration via Cup;No straw Medication Administration Whole meds with puree Compensations Slow rate;Small sips/bites;Minimize environmental distractions Postural Changes Seated upright at 90 degrees   CHL IP OTHER RECOMMENDATIONS 12/31/2018 Recommended Consults -- Oral Care Recommendations Oral care BID Other Recommendations Order thickener from pharmacy   No flowsheet data found.  CHL IP FREQUENCY AND DURATION 12/31/2018 Speech Therapy Frequency (ACUTE ONLY) min 2x/week Treatment Duration 1 week      CHL IP ORAL PHASE 12/31/2018 Oral Phase Impaired Oral - Pudding Teaspoon -- Oral - Pudding Cup --  Oral - Honey Teaspoon -- Oral - Honey Cup -- Oral - Nectar Teaspoon -- Oral - Nectar Cup Delayed oral transit;Lingual/palatal residue Oral - Nectar Straw -- Oral - Thin Teaspoon -- Oral - Thin Cup Delayed oral transit Oral - Thin Straw Delayed oral transit Oral - Puree Delayed oral transit;Decreased bolus cohesion;Lingual/palatal residue Oral - Mech Soft -- Oral - Regular Decreased bolus cohesion;Delayed oral transit;Lingual/palatal residue Oral - Multi-Consistency -- Oral - Pill Delayed oral transit Oral Phase - Comment --  CHL IP PHARYNGEAL PHASE 12/31/2018 Pharyngeal Phase Impaired Pharyngeal- Pudding Teaspoon -- Pharyngeal --  Pharyngeal- Pudding Cup -- Pharyngeal -- Pharyngeal- Honey Teaspoon -- Pharyngeal -- Pharyngeal- Honey Cup -- Pharyngeal -- Pharyngeal- Nectar Teaspoon -- Pharyngeal -- Pharyngeal- Nectar Cup Delayed swallow initiation-pyriform sinuses;Penetration/Aspiration during swallow;Penetration/Aspiration before swallow;Reduced epiglottic inversion;Reduced airway/laryngeal closure Pharyngeal Material enters airway, remains ABOVE vocal cords then ejected out Pharyngeal- Nectar Straw -- Pharyngeal -- Pharyngeal- Thin Teaspoon -- Pharyngeal -- Pharyngeal- Thin Cup Delayed swallow initiation-pyriform sinuses;Reduced epiglottic inversion;Penetration/Aspiration before swallow Pharyngeal Material enters airway, remains ABOVE vocal cords and not ejected out Pharyngeal- Thin Straw Penetration/Aspiration before swallow;Delayed swallow initiation-pyriform sinuses;Reduced epiglottic inversion;Reduced airway/laryngeal closure Pharyngeal Material enters airway, passes BELOW cords and not ejected out despite cough attempt by patient Pharyngeal- Puree Delayed swallow initiation-vallecula;Reduced tongue base retraction;Reduced laryngeal elevation;Pharyngeal residue - valleculae;Pharyngeal residue - pyriform Pharyngeal Material does not enter airway Pharyngeal- Mechanical Soft -- Pharyngeal -- Pharyngeal- Regular Reduced tongue base retraction;Delayed swallow initiation-vallecula;Pharyngeal residue - valleculae;Pharyngeal residue - pyriform Pharyngeal Material does not enter airway Pharyngeal- Multi-consistency -- Pharyngeal -- Pharyngeal- Pill Penetration/Aspiration before swallow;Reduced epiglottic inversion Pharyngeal Material enters airway, remains ABOVE vocal cords then ejected out Pharyngeal Comment --  CHL IP CERVICAL ESOPHAGEAL PHASE 12/31/2018 Cervical Esophageal Phase WFL Pudding Teaspoon -- Pudding Cup -- Honey Teaspoon -- Honey Cup -- Nectar Teaspoon -- Nectar Cup -- Nectar Straw -- Thin Teaspoon -- Thin Cup -- Thin Straw -- Puree --  Mechanical Soft -- Regular -- Multi-consistency -- Pill -- Cervical Esophageal Comment -- Chelsea E Hartness MA, CCC-SLP 12/31/2018, 10:50 AM              Ct Head Code Stroke Wo Contrast  Result Date: 12/28/2018 CLINICAL DATA:  Code stroke.  Confusion.  Left-sided weakness. EXAM: CT HEAD WITHOUT CONTRAST TECHNIQUE: Contiguous axial images were obtained from the base of the skull through the vertex without intravenous contrast. COMPARISON:  None. FINDINGS: Brain: Generalized atrophy. Brainstem and cerebellum are unremarkable. Old appearing small vessel insults affecting the thalami, basal ganglia and hemispheric white matter. No sign of acute infarction, mass lesion, hemorrhage, hydrocephalus or extra-axial collection. Vascular: There is atherosclerotic calcification of the major vessels at the base of the brain. Skull: Negative Sinuses/Orbits: Previous sinus surgery. Mild chronic mucosal thickening. Other: None ASPECTS ([place] Stroke Program Early CT Score) - Ganglionic level infarction (caudate, lentiform nuclei, internal capsule, insula, M1-M3 cortex): 7 - Supraganglionic infarction (M4-M6 cortex): 3 Total score (0-10 with 10 being normal): 10 IMPRESSION: 1. No acute finding by CT. Atrophy and chronic small-vessel ischemic changes. 2. ASPECTS is 10. 3. These results were communicated to Dr. Leonel Ramsay at 8:49 pmon 4/29/2020by text page via the [facility] messaging system. Electronically Signed   By: Nelson Chimes M.D.   On: 12/28/2018 20:49   US Abdomen Limited Ruq  Result Date: 12/31/2018 CLINICAL DATA:  In cephalopathy EXAM: ULTRASOUND ABDOMEN LIMITED RIGHT UPPER QUADRANT COMPARISON:  None. FINDINGS: Gallbladder: Multiple small stones within the gallbladder measuring 4 mm. No wall thickening or sonographic  Murphy's sign. Common bile duct: Diameter: Normal caliber, 3 mm Liver: Increased echotexture compatible with fatty infiltration. No focal abnormality or biliary ductal dilatation. Portal vein is patent on  color Doppler imaging with normal direction of blood flow towards the liver. IMPRESSION: Cholelithiasis.  No sonographic evidence of acute cholecystitis. Mild fatty infiltration of the liver. Electronically Signed   By: Rolm Baptise M.D.   On: 12/31/2018 22:24    Microbiology: Recent Results (from the past 240 hour(s))  MRSA PCR Screening     Status: None   Collection Time: 12/28/18 10:41 PM  Result Value Ref Range Status   MRSA by PCR NEGATIVE NEGATIVE Final    Comment:        The GeneXpert MRSA Assay (FDA approved for NASAL specimens only), is one component of a comprehensive MRSA colonization surveillance program. It is not intended to diagnose MRSA infection nor to guide or monitor treatment for MRSA infections. Performed at [facility], [place] [address], [place], [place] [address]      Labs: Basic Metabolic Panel: Recent Labs  Lab 12/28/18 2031 12/28/18 2037 12/28/18 2142  12/30/18 0217 12/30/18 1413 12/31/18 0216 01/01/19 0209 01/02/19 0540 01/03/19 0556  NA 130*  --  131*  --  140  --  143  --  141 141  K 4.0  --  3.8  --  3.1*  --  3.8  --  3.2* 3.2*  CL 93*  --   --   --  105  --  113*  --  111 110  CO2 24  --   --   --  23  --  21*  --  19* 23  GLUCOSE 706*  --   --   --  193*  --  100*  --  221* 139*  BUN 23  --   --   --  24*  --  13  --  14 10  CREATININE 2.04* 1.90*  --   --  1.82*  --  1.31*  --  1.47* 1.31*  CALCIUM 9.3  --   --   --  9.2  --  8.8*  --  8.9 8.6*  MG  --   --   --    < > 2.0 2.0 2.1 2.1 1.9 1.9  PHOS  --   --   --   --  2.8 3.2  --  2.1* 1.8* 1.8*   < > = values in this interval not displayed.   Liver Function Tests: Recent Labs  Lab 12/28/18 2031 12/30/18 0217 12/31/18 0216 01/01/19 0209  AST 23  --  39 35  ALT 11  --  25 21  ALKPHOS 97  --  58 54  BILITOT 1.1  --  0.9 0.9  PROT 7.4  --  5.7* 5.6*  ALBUMIN 4.0 3.4* 2.8* 2.7*   No results for input(s): LIPASE, AMYLASE in the last 168 hours. Recent Labs  Lab  12/31/18 0216 01/02/19 0540  AMMONIA 109* 64*   CBC: Recent Labs  Lab 12/28/18 2031  12/30/18 0217 12/31/18 0216 01/01/19 0209 01/02/19 0540 01/03/19 0556  WBC 7.6  --  10.5 7.8 7.1 7.1 10.0  NEUTROABS 3.7  --   --   --  4.3 4.5 6.5  HGB 15.7   < > 15.1 14.2 13.0 12.7* 11.8*  HCT 47.6   < > 43.3 43.0 38.7* 37.8* 35.4*  MCV 90.8  --  86.6 91.3 88.8 88.5 88.9  PLT 167  --  157 117* 141* 126* 177   < > = values in this interval not displayed.   Cardiac Enzymes: Recent Labs  Lab 12/28/18 2318 12/30/18 1413  TROPONINI <0.03 0.06*   BNP: BNP (last 3 results) No results for input(s): BNP in the last 8760 hours.  ProBNP (last 3 results) No results for input(s): PROBNP in the last 8760 hours.  CBG: Recent Labs  Lab 01/02/19 1606 01/02/19 2048 01/02/19 2306 01/03/19 0309 01/03/19 1200  GLUCAP 265* 172* 200* 185* 303*       Signed:  Florencia Reasons MD, PhD  [facility] 01/03/2019, 1:33 PM

## 2019-01-03 NOTE — Progress Notes (Signed)
Occupational Therapy Treatment Patient Details Name: Eric Wade MRN: [id number] DOB: 1941/07/31 Today's Date: 01/03/2019    History of present illness Pt is an 78 y/o male admitted secondary to L sided weakness and AMS. CT and MRI negative for acute stroke, however, did reveal chronic infarcts. Pt given TPA upon admission. Thought to have Acute metabolic Encephalopathy of unclear etiology, possibly due to uncontrolled DM. Pt was intubated 4/29 and extubated 5/1. PMH includes HTN and DM.    OT comments  This 78 yo male admitted with above presents to acute OT today with making progress with transfers and grooming. He will continue to benefit from acute OT with follow up HHOT and 24 hour S/prn A.   Follow Up Recommendations  Home health OT;Supervision/Assistance - 24 hour(someone with him anytime he is up on his feet)    Equipment Recommendations  3 in 1 bedside commode       Precautions / Restrictions Precautions Precautions: Fall Precaution Comments: safety sitter Restrictions Weight Bearing Restrictions: No       Mobility Bed Mobility               General bed mobility comments: pt up in recliner upon arrival  Transfers Overall transfer level: Needs assistance Equipment used: Rolling walker (2 wheeled) Transfers: Sit to/from Stand Sit to Stand: Min assist         General transfer comment: trouble with manuvering RW    Balance Overall balance assessment: Needs assistance Sitting-balance support: No upper extremity supported;Feet supported Sitting balance-Leahy Scale: Good     Standing balance support: No upper extremity supported;During functional activity Standing balance-Leahy Scale: Fair Standing balance comment: standing at sink to wash face                           ADL either performed or assessed with clinical judgement   ADL Overall ADL's : Needs assistance/impaired     Grooming: Wash/dry face;Brushing  hair;Sitting;Standing Grooming Details (indicate cue type and reason): Stood to wash face (min A for balance) and sat to comb hair(setup/S)     Lower Body Bathing: Supervison/ safety;Set up Lower Body Bathing Details (indicate cue type and reason): sitting to wash feet (due to incontinent of urine)     Lower Body Dressing: Supervision/safety;Set up Lower Body Dressing Details (indicate cue type and reason): sitting to doff and donn socks Toilet Transfer: Minimal assistance;Ambulation;RW;Regular Toilet;Grab bars Toilet Transfer Details (indicate cue type and reason): difficulty with manuvering RW                 Vision Patient Visual Report: No change from baseline            Cognition Arousal/Alertness: Awake/alert Behavior During Therapy: WFL for tasks assessed/performed Overall Cognitive Status: Impaired/Different from baseline Area of Impairment: Safety/judgement;Problem solving                         Safety/Judgement: Decreased awareness of safety;Decreased awareness of deficits   Problem Solving: Requires verbal cues;Requires tactile cues(with manuvering RW)                     Pertinent Vitals/ Pain       Pain Assessment: No/denies pain         Frequency  Min 2X/week        Progress Toward Goals  OT Goals(current goals can now be found in the care plan section)  Progress towards OT goals: Progressing toward goals     Plan Discharge plan remains appropriate       AM-PAC OT "6 Clicks" Daily Activity     Outcome Measure   Help from another person eating meals?: None Help from another person taking care of personal grooming?: A Little Help from another person toileting, which includes using toliet, bedpan, or urinal?: A Lot Help from another person bathing (including washing, rinsing, drying)?: A Little Help from another person to put on and taking off regular upper body clothing?: A Little Help from another person to put on and  taking off regular lower body clothing?: A Little 6 Click Score: 18    End of Session Equipment Utilized During Treatment: Gait belt;Rolling walker  OT Visit Diagnosis: Unsteadiness on feet (R26.81);Other abnormalities of gait and mobility (R26.89);Other symptoms and signs involving cognitive function   Activity Tolerance Patient tolerated treatment well   Patient Left in chair;with call bell/phone within reach(safety sitter)   Nurse Communication (NT was already aware of how he moved)        Time: 0277-4128 OT Time Calculation (min): 23 min  Charges: OT General Charges $OT Visit: 1 Visit OT Treatments $Self Care/Home Management : 23-37 mins Eric Wade, OTR/L Acute [employer] Pager [phone] Office [phone]      Eric Wade 01/03/2019, 10:47 AM

## 2019-01-04 ENCOUNTER — Telehealth: Payer: Self-pay | Admitting: *Deleted

## 2019-01-04 NOTE — Telephone Encounter (Signed)
Tried calling pt no answer, and could leave msg due to no vm. Will retry later.Marland KitchenJohny Chess

## 2019-01-04 NOTE — Telephone Encounter (Signed)
Pt was on [facility] discharge list, and he was informed to f/u w/PCP in 1 week. Pt is not able to do virtual appt. Dr. Jenny Reichmann is it ok for you to do a telephone visit or have pt to come into the office.Marland KitchenJohny Wade

## 2019-01-04 NOTE — Telephone Encounter (Signed)
[place] for pt in person visit, as the [place] MDs are now seeing 1 pt per hour per doctor

## 2019-01-05 ENCOUNTER — Telehealth: Payer: Self-pay | Admitting: Internal Medicine

## 2019-01-05 LAB — GLUCOSE, CAPILLARY
Glucose-Capillary: 325 mg/dL — ABNORMAL HIGH (ref 70–99)
Glucose-Capillary: 144 mg/dL — ABNORMAL HIGH (ref 70–99)

## 2019-01-05 MED ORDER — RELION BLOOD PRESSURE MONITOR KIT: PACK | 0 refills | Status: DC

## 2019-01-05 MED ORDER — DILTIAZEM HCL ER 240 MG PO CP24: 240.0000 mg | ORAL_CAPSULE | Freq: Every day | ORAL | 1 refills | Status: DC

## 2019-01-05 MED ORDER — GLUCOSE BLOOD VI STRP: ORAL_STRIP | 5 refills | Status: DC

## 2019-01-05 MED ORDER — LISINOPRIL 20 MG PO TABS: 20.0000 mg | ORAL_TABLET | Freq: Every day | ORAL | 0 refills | Status: DC

## 2019-01-05 MED ORDER — RELION LANCETS ULTRA-THIN 30G MISC: 0 refills | Status: DC

## 2019-01-05 NOTE — Telephone Encounter (Signed)
Copied from [place] [id number]. Topic: Quick Communication - Home Health Verbal Orders >> Jan 05, 2019  2:40 PM Sheran Luz wrote: Caller/Agency: Marcene Brawn, RN with [facility] Number: [phone] Requesting Skilled Nursing Frequency: 2w2, 1w3

## 2019-01-05 NOTE — Telephone Encounter (Signed)
Eric Wade also mentioned that he was out of diltiazem so I have sent in a refill to the pharmacy.

## 2019-01-05 NOTE — Telephone Encounter (Signed)
Verbal orders given  

## 2019-01-05 NOTE — Addendum Note (Signed)
Addended by: [name] on: 01/05/2019 05:23 PM   Modules accepted: Orders

## 2019-01-05 NOTE — Telephone Encounter (Signed)
Copied from [place] [phone]. Topic: Quick Communication - See Telephone Encounter >> Jan 05, 2019  2:42 PM Sheran Luz wrote: CRM for notification. See Telephone encounter for: 01/05/19.  Marcene Brawn, RN with [facility], calling as FYI to inform PCP that patient's blood pressure is slightly elevated today. She reports that it is currently 168/72.

## 2019-01-05 NOTE — Telephone Encounter (Signed)
Called pt again still no answer.. so I called daughter [name]) inform her we was trying to reach her father to make hosp f/u. Daughter states since he came home he has been over to her house. Was ablte to talk to pt but he gave phone back to daughter due to [facility] nurse was there. Completed TCM call below.Eric Wade  Transition Care Management Follow-up Telephone Call   Date discharged? 01/03/19   How have you been since you were released from the hospital? Pt states he is doing alright..could be better   Do you understand why you were in the hospital? YES   Do you understand the discharge instructions? YES   Where were you discharged to? Home   Items Reviewed:  Medications reviewed: YES, daughter states he is needing refill on his diltiazem, and also he has bot check his BS would like BS monitor w/supplies sent to [facility] on [address].  Allergies reviewed: YES  Dietary changes reviewed: YES, carb modified and heart healthy  Referrals reviewed: No referral recommeded   Functional Questionnaire:   Activities of Daily Living (ADLs):   Daughter states he are independent in the following: bathing and hygiene, feeding, continence, grooming, toileting and dressing States he require assistance with the following: ambulation sometimes. He be off balance   Any transportation issues/concerns?: NO, daughter states she will be the one who bring him   Any patient concerns? NO   Confirmed importance and date/time of follow-up visits scheduled YES, appt 01/10/19  Provider Appointment booked with Dr. Jenny Reichmann in person since he has no capability for virtual appt  Confirmed with patient if condition begins to worsen call PCP or go to the ER.  Patient was given the office number and encouraged to call back with question or concerns.  : YES, also inform daughter will send rx for BS monitor to check blood sugars, and to bring when he come to appt so MD can see how his BS is running

## 2019-01-06 ENCOUNTER — Telehealth: Payer: Self-pay | Admitting: Internal Medicine

## 2019-01-06 NOTE — Telephone Encounter (Signed)
Pt has hosp f/u scheduled for 01/10/19.Marland KitchenJohny Wade

## 2019-01-06 NOTE — Telephone Encounter (Signed)
Copied from [place] [id number]. Topic: Quick Communication - Home Health Verbal Orders >> Jan 06, 2019 11:39 AM Gustavus Messing wrote: Caller/Agency: [facility] Number: [phone] Eric Wade Requesting OT/PT/Skilled Nursing/Social Work/Speech Therapy: PT Frequency: 1 week 1 2 week for 4 weeks

## 2019-01-06 NOTE — Telephone Encounter (Signed)
Notified [name] w/MD response.Marland KitchenJohny Wade

## 2019-01-06 NOTE — Telephone Encounter (Signed)
Ok for verbals 

## 2019-01-10 ENCOUNTER — Ambulatory Visit (INDEPENDENT_AMBULATORY_CARE_PROVIDER_SITE_OTHER): Payer: Medicare HMO | Admitting: Internal Medicine

## 2019-01-10 ENCOUNTER — Other Ambulatory Visit: Payer: Self-pay

## 2019-01-10 ENCOUNTER — Encounter: Payer: Self-pay | Admitting: Internal Medicine

## 2019-01-10 ENCOUNTER — Other Ambulatory Visit: Payer: Self-pay | Admitting: Internal Medicine

## 2019-01-10 ENCOUNTER — Telehealth: Payer: Self-pay

## 2019-01-10 ENCOUNTER — Other Ambulatory Visit (INDEPENDENT_AMBULATORY_CARE_PROVIDER_SITE_OTHER): Payer: Medicare HMO

## 2019-01-10 VITALS — BP 130/86 | HR 84 | Temp 97.9°F | Ht 73.0 in | Wt 182.0 lb

## 2019-01-10 DIAGNOSIS — E119 Type 2 diabetes mellitus without complications: Secondary | ICD-10-CM | POA: Diagnosis not present

## 2019-01-10 DIAGNOSIS — N183 Chronic kidney disease, stage 3 unspecified: Secondary | ICD-10-CM

## 2019-01-10 DIAGNOSIS — I10 Essential (primary) hypertension: Secondary | ICD-10-CM

## 2019-01-10 DIAGNOSIS — IMO0001 Reserved for inherently not codable concepts without codable children: Secondary | ICD-10-CM

## 2019-01-10 DIAGNOSIS — R7989 Other specified abnormal findings of blood chemistry: Secondary | ICD-10-CM | POA: Insufficient documentation

## 2019-01-10 DIAGNOSIS — Z794 Long term (current) use of insulin: Secondary | ICD-10-CM

## 2019-01-10 DIAGNOSIS — I639 Cerebral infarction, unspecified: Secondary | ICD-10-CM

## 2019-01-10 DIAGNOSIS — Z8673 Personal history of transient ischemic attack (TIA), and cerebral infarction without residual deficits: Secondary | ICD-10-CM | POA: Diagnosis not present

## 2019-01-10 LAB — CBC WITH DIFFERENTIAL/PLATELET
Basophils Absolute: 0.1 10*3/uL (ref 0.0–0.1)
Basophils Relative: 1.1 % (ref 0.0–3.0)
Eosinophils Absolute: 0.2 10*3/uL (ref 0.0–0.7)
Eosinophils Relative: 2 % (ref 0.0–5.0)
HCT: 41.9 % (ref 39.0–52.0)
Hemoglobin: 14.2 g/dL (ref 13.0–17.0)
Lymphocytes Relative: 27.1 % (ref 12.0–46.0)
Lymphs Abs: 2.1 10*3/uL (ref 0.7–4.0)
MCHC: 33.8 g/dL (ref 30.0–36.0)
MCV: 90.2 fl (ref 78.0–100.0)
Monocytes Absolute: 0.8 10*3/uL (ref 0.1–1.0)
Monocytes Relative: 10 % (ref 3.0–12.0)
Neutro Abs: 4.7 10*3/uL (ref 1.4–7.7)
Neutrophils Relative %: 59.8 % (ref 43.0–77.0)
Platelets: 310 10*3/uL (ref 150.0–400.0)
RBC: 4.64 Mil/uL (ref 4.22–5.81)
RDW: 13.5 % (ref 11.5–15.5)
WBC: 7.8 10*3/uL (ref 4.0–10.5)

## 2019-01-10 LAB — BASIC METABOLIC PANEL
BUN: 15 mg/dL (ref 6–23)
CO2: 28 mEq/L (ref 19–32)
Calcium: 9.5 mg/dL (ref 8.4–10.5)
Chloride: 101 mEq/L (ref 96–112)
Creatinine, Ser: 1.5 mg/dL (ref 0.40–1.50)
GFR: 54.44 mL/min — ABNORMAL LOW (ref >60.00)
Glucose, Bld: 334 mg/dL — ABNORMAL HIGH (ref 70–99)
Potassium: 4.4 mEq/L (ref 3.5–5.1)
Sodium: 136 mEq/L (ref 135–145)

## 2019-01-10 LAB — HEPATIC FUNCTION PANEL
ALT: 11 U/L (ref 0–53)
AST: 14 U/L (ref 0–37)
Albumin: 3.8 g/dL (ref 3.5–5.2)
Alkaline Phosphatase: 69 U/L (ref 39–117)
Bilirubin, Direct: 0.2 mg/dL (ref 0.0–0.3)
Total Bilirubin: 0.8 mg/dL (ref 0.2–1.2)
Total Protein: 7.6 g/dL (ref 6.0–8.3)

## 2019-01-10 LAB — AMMONIA: Ammonia: 29 umol/L (ref 11–35)

## 2019-01-10 NOTE — Progress Notes (Signed)
Subjective:    Patient ID: Eric Wade, male    DOB: September 12, 1940, 78 y.o.   MRN: [id number]  HPI  Here to f/u recent hospn 4/29 - 5/5 with acute stroke with left sided weakness and dysphagia now essentially resolved.   Has been recently non compliant with meds but now helped by daughter who follows this closely.  Pt denies new neurological symptoms such as new headache, or facial or extremity weakness or numbness  Pt denies chest pain, increased sob or doe, wheezing, orthopnea, PND, increased LE swelling, palpitations, dizziness or syncope.  Denies worsening reflux, abd pain, dysphagia, n/v, bowel change or blood, though did have increased ammonia level for unclear reason with TME and improved with lactulose that he continues.   Pt denies fever, wt loss, night sweats, loss of appetite, or other constitutional symptoms  No new complaints Past Medical History:  Diagnosis Date  . Allergic rhinitis 08/25/2016  . DIABETES MELLITUS, TYPE II 05/08/2007  . ERECTILE DYSFUNCTION 05/08/2007  . GOUT 07/01/2007  . HYPERLIPIDEMIA 05/08/2007  . HYPERTENSION 05/08/2007  . OBESITY 05/08/2007  . Preventative health care 12/28/2010  . Prostate cancer ([place]) 07/02/2008   Qualifier: Diagnosis of  By: Jenny Reichmann MD, Hunt Oris   . PSA, INCREASED 07/02/2008  . RENAL INSUFFICIENCY 07/01/2007  . Seasonal allergies 12/31/2018   No past surgical history on file.  reports that he has quit smoking. He has never used smokeless tobacco. He reports that he does not drink alcohol or use drugs. family history includes Hypertension in an other family member. Allergies  Allergen Reactions  . Nsaids     REACTION: renal insufficiency   Current Outpatient Medications on File Prior to Visit  Medication Sig Dispense Refill  . aspirin 81 MG EC tablet Take 81 mg by mouth daily.      [name] atorvastatin (LIPITOR) 20 MG tablet Take 1 tablet (20 mg total) by mouth daily at 6 PM. 30 tablet 0  . blood glucose meter kit and supplies KIT Pt receives  from [facility] 1 each 0  . blood glucose meter kit and supplies KIT Dispense based on patient and insurance preference. Use up to four times daily as directed. (FOR ICD-9 250.00, 250.01). 1 each 0  . Blood Pressure Monitoring (RELION BLOOD PRESSURE MONITOR) KIT Use to check blood sugars twice a day 1 each 0  . diltiazem (DILT-XR) 240 MG 24 hr capsule Take 1 capsule (240 mg total) by mouth daily. 90 capsule 1  . glucose blood (RELION GLUCOSE TEST STRIPS) test strip Use to check blood sugars twice a day 100 each 5  . hydrALAZINE (APRESOLINE) 25 MG tablet Take 1 tablet (25 mg total) by mouth 3 (three) times daily. 120 tablet 0  . insulin NPH-regular Human (70-30) 100 UNIT/ML injection Inject 8 Units into the skin 2 (two) times daily with a meal. 10 mL 0  . isosorbide mononitrate (IMDUR) 30 MG 24 hr tablet Take 1 tablet (30 mg total) by mouth daily. 30 tablet 0  . lactulose (CHRONULAC) 10 GM/15ML solution Take 15 mLs (10 g total) by mouth daily. 236 mL 0  . Lancet Devices (AUTO-LANCET) MISC Pt received from [facility] 1 each 0  . lisinopril (ZESTRIL) 20 MG tablet Take 1 tablet (20 mg total) by mouth daily. Must keep May 12th appt for future refills 30 tablet 0  . Maltodextrin-Xanthan Gum (RESOURCE THICKENUP CLEAR) POWD For nectar thick liquid 10 Can 0  . ReliOn Ultra Thin Lancets MISC Use to help  check blood sugars twice a day 100 each 0  . TRUE METRIX BLOOD GLUCOSE TEST test strip TEST AS DIRECTED TWICE PER DAY 100 each 2   No current facility-administered medications on file prior to visit.    Review of Systems  Constitutional: Negative for other unusual diaphoresis or sweats HENT: Negative for ear discharge or swelling Eyes: Negative for other worsening visual disturbances Respiratory: Negative for stridor or other swelling  Gastrointestinal: Negative for worsening distension or other blood Genitourinary: Negative for retention or other urinary change Musculoskeletal: Negative for other MSK pain  or swelling Skin: Negative for color change or other new lesions Neurological: Negative for worsening tremors and other numbness  Psychiatric/Behavioral: Negative for worsening agitation or other fatigue All other system neg per pt    Objective:   Physical Exam BP 130/86   Pulse 84   Temp 97.9 F (36.6 C) (Oral)   Ht '6\' 1"'  (1.854 m)   Wt 182 lb (82.6 kg)   SpO2 96%   BMI 24.01 kg/m  VS noted,  Constitutional: Pt appears in NAD HENT: Head: NCAT.  Right Ear: External ear normal.  Left Ear: External ear normal.  Eyes: . Pupils are equal, round, and reactive to light. Conjunctivae and EOM are normal Nose: without d/c or deformity Neck: Neck supple. Gross normal ROM Cardiovascular: Normal rate and regular rhythm.   Pulmonary/Chest: Effort normal and breath sounds without rales or wheezing.  Abd:  Soft, NT, ND, + BS, no organomegaly Neurological: Pt is alert. At baseline orientation, motor grossly intact Skin: Skin is warm. No rashes, other new lesions, no LE edema Psychiatric: Pt behavior is normal without agitation \ No other exam findings Lab Results  Component Value Date   WBC 7.8 01/10/2019   HGB 14.2 01/10/2019   HCT 41.9 01/10/2019   PLT 310.0 01/10/2019   GLUCOSE 334 (H) 01/10/2019   CHOL 179 12/30/2018   TRIG 59 12/30/2018   HDL 66 12/30/2018   LDLDIRECT 72.5 12/30/2010   LDLCALC 101 (H) 12/30/2018   ALT 11 01/10/2019   AST 14 01/10/2019   NA 136 01/10/2019   K 4.4 01/10/2019   CL 101 01/10/2019   CREATININE 1.50 01/10/2019   BUN 15 01/10/2019   CO2 28 01/10/2019   TSH 1.20 03/11/2017   PSA 0.30 08/25/2016   INR 0.9 12/28/2018   HGBA1C 13.6 (H) 12/30/2018   MICROALBUR 2.6 (H) 03/11/2017        Assessment & Plan:

## 2019-01-10 NOTE — Patient Instructions (Signed)
Please continue all other medications as before, and refills have been done if requested.  Please have the pharmacy call with any other refills you may need.  Please continue your efforts at being more active, diabetic diet, and weight control  Please keep your appointments with your specialists as you may have planned  You will be contacted regarding the referral for: Gastroenterology  Please go to the LAB in the Basement (turn left off the elevator) for the tests to be done today  You will be contacted by phone if any changes need to be made immediately.  Otherwise, you will receive a letter about your results with an explanation, but please check with MyChart first.  Please remember to sign up for MyChart if you have not done so, as this will be important to you in the future with finding out test results, communicating by private email, and scheduling acute appointments online when needed.  Please return in 3 months, or sooner if needed

## 2019-01-10 NOTE — Telephone Encounter (Signed)
-----   Message from [name], MD sent at 01/10/2019 12:34 PM EDT ----- Letter sent, cont same tx except  The test results show that your current treatment is OK, except the blood sugar is still moderately elevated.  If OK with you, I will refer to Endocrinology for better control.  I will do the referral and you should hear from the office. [name]    Redmond Baseman to please inform pt, I will do referral

## 2019-01-10 NOTE — Telephone Encounter (Signed)
Attempted to call pt to discuss recent lab work. Phone continuously rings, no VM to leave a message.   CRM created.

## 2019-01-10 NOTE — Progress Notes (Signed)
Late entry for missed Modified Rankin Score.  Score based on review of medical record.     01/02/19 1610  Modified Rankin (Stroke Patients Only)  Pre-Morbid Rankin Score 0  Modified Rankin [place], [place]   Acute Rehabilitation Services  Pager [phone] Office [phone] 01/10/2019

## 2019-01-12 ENCOUNTER — Telehealth: Payer: Self-pay | Admitting: Internal Medicine

## 2019-01-12 NOTE — Telephone Encounter (Signed)
Copied from [place] [id number]. Topic: Quick Communication - See Telephone Encounter >> Jan 12, 2019  3:41 PM Sheran Luz wrote: CRM for notification. See Telephone encounter for: 01/12/19.  Estill Bamberg, RN with [facility] (not with patient at time of call) , calling to report abnormal BP reading. She states it was 170/64 during [facility] visit today.

## 2019-01-15 ENCOUNTER — Encounter: Payer: Self-pay | Admitting: Internal Medicine

## 2019-01-15 NOTE — Assessment & Plan Note (Signed)
stable overall by history and exam, recent data reviewed with pt, and pt to continue medical treatment as before,  to f/u any worsening symptoms or concerns  

## 2019-01-15 NOTE — Assessment & Plan Note (Signed)
Symptomatically improved, to cont PT, cont statin and asa 81 qd

## 2019-01-15 NOTE — Assessment & Plan Note (Signed)
Unclear etiology, for f/u lab, refer GI, cont lactulose

## 2019-01-15 NOTE — Telephone Encounter (Signed)
If possible, have pt check Bp daily for 1 wk and call with results, thanks

## 2019-01-16 ENCOUNTER — Ambulatory Visit: Payer: Self-pay | Admitting: *Deleted

## 2019-01-16 NOTE — Telephone Encounter (Signed)
[facility] LPN 'Lambert Keto' calling. States pts BS 585, checked few minutes ago. Pt states BS has been registering 'HIGH' without giving a value since d/ced from hospital. Reports. Increased fatigue, frequent urination, thirst. Denies rapid breathing. States prior to admission pt was on 40u NPH daily, discharged home with 8u BID. No missed doses. States BS has been high since or will not register.After hours call, pt directed to ED. Care advise given per protocol.  a  Reason for Disposition . Blood glucose > 500 mg/dL (27.8 mmol/L)  Answer Assessment - Initial Assessment Questions 1. BLOOD GLUCOSE: "What is your blood glucose level?"      585 2. ONSET: "When did you check the blood glucose?"     Few minutes ago by [facility] nurse 3. USUAL RANGE: "What is your glucose level usually?" (e.g., usual fasting morning value, usual evening value)     Has not registered for weeks 4. KETONES: "Do you check for ketones (urine or blood test strips)?" If yes, ask: "What does the test show now?"      no 5. TYPE 1 or 2:  "Do you know what type of diabetes you have?"  (e.g., Type 1, Type 2, Gestational; doesn't know)      2 6. INSULIN: "Do you take insulin?" "What type of insulin(s) do you use? What is the mode of delivery? (syringe, pen (e.g., injection or  pump)?"      NPH  Was on 40u daily now 8u BID after hospitalization. 7. DIABETES PILLS: "Do you take any pills for your diabetes?" If yes, ask: "Have you missed taking any pills recently?"     no 8. OTHER SYMPTOMS: "Do you have any symptoms?" (e.g., fever, frequent urination, difficulty breathing, dizziness, weakness, vomiting)    Frequent urination, increased fatigue, thirst  Protocols used: DIABETES - HIGH BLOOD SUGAR-A-AH

## 2019-01-16 NOTE — Telephone Encounter (Signed)
Called pt- no answer/unable to leave vm. Left detailed message informing Estill Bamberg of MD's advisement below.

## 2019-01-19 ENCOUNTER — Telehealth: Payer: Self-pay | Admitting: Internal Medicine

## 2019-01-19 NOTE — Telephone Encounter (Signed)
Copied from [place] [id number]. Topic: General - Other >> Jan 19, 2019 11:08 AM Oneta Rack wrote: Caller Name: Claiborne Billings Relation to pt: LPN from [facility]  Call back number: [phone]  Pharmacy[id number] [place], [place] [phone] (Phone) [phone] (Fax)   Reason for call:  LPN reports patient currently taking 20 units of insulin in the am and the pm. Blood sugar readings today fasting blood sugar was 185.  LPN reports deep tissue wound on lateral foot 5cm long 0.2  width, wound not open no drainage, dark black In color. Requestring orders to clean would. Please leave a message if LPN doesn't pick up regarding orders.  *LPN requesting lactulose ([place]) 10 GM/15ML solution please send medication to  [id number] [place], [place] [phone] (Phone) [phone] (Fax)

## 2019-01-19 NOTE — Telephone Encounter (Signed)
Ok to continue same insulin for now  [facility] for wound consult verbals for treatment

## 2019-01-19 NOTE — Telephone Encounter (Signed)
Verbal orders given  

## 2019-01-24 ENCOUNTER — Telehealth: Payer: Self-pay

## 2019-01-24 NOTE — Telephone Encounter (Signed)
Copied from [place] [id number]. Topic: General - Inquiry >> Jan 24, 2019  1:52 PM Margot Ables wrote: Reason for CRM: Pt is scheduled for cataract surgery on left eye 02/20/2019. It was supposed to be earlier but cancelled due to [place]. Pt is needing a clearance letter from Dr. Jenny Reichmann that he is ok to proceed with the surgery. Surgery will be with Dr. Bing Plume with [facility] in [place]. Her email is [email or web address]

## 2019-01-25 ENCOUNTER — Other Ambulatory Visit: Payer: Self-pay | Admitting: Internal Medicine

## 2019-01-25 NOTE — Telephone Encounter (Signed)
Ok, letter to be sent/done hardcopy to [facility]

## 2019-01-26 NOTE — Telephone Encounter (Signed)
Letter has been faxed to [facility] in [place].

## 2019-02-03 ENCOUNTER — Other Ambulatory Visit: Payer: Self-pay | Admitting: Internal Medicine

## 2019-02-07 ENCOUNTER — Telehealth: Payer: Self-pay

## 2019-02-07 NOTE — Addendum Note (Signed)
Addended by: [name] on: 02/07/2019 08:13 PM   Modules accepted: Orders

## 2019-02-07 NOTE — Telephone Encounter (Signed)
Copied from [place] [id number]. Topic: Referral - Request for Referral >> Feb 07, 2019 10:18 AM Leward Quan A wrote: Has patient seen PCP for this complaint? Yes.   *If NO, is insurance requiring patient see PCP for this issue before PCP can refer them? Referral for which specialty: Barium Swallow Preferred provider/office: [facility]  Reason for referral: Modified Barium Swallow, Follow up study for upgrading to thin liquids.   Junie Panning [phone]

## 2019-02-07 NOTE — Telephone Encounter (Signed)
Ok this is done 

## 2019-02-08 ENCOUNTER — Other Ambulatory Visit (HOSPITAL_COMMUNITY): Payer: Self-pay

## 2019-02-08 DIAGNOSIS — R1310 Dysphagia, unspecified: Secondary | ICD-10-CM

## 2019-02-13 ENCOUNTER — Emergency Department (HOSPITAL_COMMUNITY)
Admission: EM | Admit: 2019-02-13 | Discharge: 2019-02-13 | Disposition: A | Discharge status: Home / Self Care | Payer: Medicare HMO | Attending: Emergency Medicine | Admitting: Emergency Medicine

## 2019-02-13 ENCOUNTER — Other Ambulatory Visit: Payer: Self-pay

## 2019-02-13 ENCOUNTER — Emergency Department (HOSPITAL_COMMUNITY): Payer: Medicare HMO

## 2019-02-13 ENCOUNTER — Telehealth: Payer: Self-pay | Admitting: Internal Medicine

## 2019-02-13 ENCOUNTER — Encounter (HOSPITAL_COMMUNITY): Payer: Self-pay | Admitting: Emergency Medicine

## 2019-02-13 DIAGNOSIS — Z7982 Long term (current) use of aspirin: Secondary | ICD-10-CM | POA: Diagnosis not present

## 2019-02-13 DIAGNOSIS — R5383 Other fatigue: Secondary | ICD-10-CM | POA: Diagnosis not present

## 2019-02-13 DIAGNOSIS — R531 Weakness: Secondary | ICD-10-CM | POA: Insufficient documentation

## 2019-02-13 DIAGNOSIS — E1122 Type 2 diabetes mellitus with diabetic chronic kidney disease: Secondary | ICD-10-CM | POA: Insufficient documentation

## 2019-02-13 DIAGNOSIS — Z79899 Other long term (current) drug therapy: Secondary | ICD-10-CM | POA: Diagnosis not present

## 2019-02-13 DIAGNOSIS — R55 Syncope and collapse: Secondary | ICD-10-CM | POA: Diagnosis present

## 2019-02-13 DIAGNOSIS — N183 Chronic kidney disease, stage 3 (moderate): Secondary | ICD-10-CM | POA: Diagnosis not present

## 2019-02-13 DIAGNOSIS — Z794 Long term (current) use of insulin: Secondary | ICD-10-CM | POA: Diagnosis not present

## 2019-02-13 DIAGNOSIS — I129 Hypertensive chronic kidney disease with stage 1 through stage 4 chronic kidney disease, or unspecified chronic kidney disease: Secondary | ICD-10-CM | POA: Diagnosis not present

## 2019-02-13 DIAGNOSIS — Z87891 Personal history of nicotine dependence: Secondary | ICD-10-CM | POA: Diagnosis not present

## 2019-02-13 DIAGNOSIS — Z8546 Personal history of malignant neoplasm of prostate: Secondary | ICD-10-CM | POA: Insufficient documentation

## 2019-02-13 LAB — HEPATIC FUNCTION PANEL
ALT: 12 U/L (ref 0–44)
AST: 22 U/L (ref 15–41)
Albumin: 3.5 g/dL (ref 3.5–5.0)
Alkaline Phosphatase: 67 U/L (ref 38–126)
Bilirubin, Direct: 0.3 mg/dL — ABNORMAL HIGH (ref 0.0–0.2)
Indirect Bilirubin: 0.6 mg/dL (ref 0.3–0.9)
Total Bilirubin: 0.9 mg/dL (ref 0.3–1.2)
Total Protein: 7.1 g/dL (ref 6.5–8.1)

## 2019-02-13 LAB — CBC WITH DIFFERENTIAL/PLATELET
Abs Immature Granulocytes: 0.11 10*3/uL — ABNORMAL HIGH (ref 0.00–0.07)
Basophils Absolute: 0 10*3/uL (ref 0.0–0.1)
Basophils Relative: 0 %
Eosinophils Absolute: 0 10*3/uL (ref 0.0–0.5)
Eosinophils Relative: 0 %
HCT: 37.3 % — ABNORMAL LOW (ref 39.0–52.0)
Hemoglobin: 12.4 g/dL — ABNORMAL LOW (ref 13.0–17.0)
Immature Granulocytes: 1 %
Lymphocytes Relative: 5 %
Lymphs Abs: 0.8 10*3/uL (ref 0.7–4.0)
MCH: 29.9 pg (ref 26.0–34.0)
MCHC: 33.2 g/dL (ref 30.0–36.0)
MCV: 89.9 fL (ref 80.0–100.0)
Monocytes Absolute: 1 10*3/uL (ref 0.1–1.0)
Monocytes Relative: 7 %
Neutro Abs: 13.1 10*3/uL — ABNORMAL HIGH (ref 1.7–7.7)
Neutrophils Relative %: 87 %
Platelets: 188 10*3/uL (ref 150–400)
RBC: 4.15 MIL/uL — ABNORMAL LOW (ref 4.22–5.81)
RDW: 13.3 % (ref 11.5–15.5)
WBC: 15 10*3/uL — ABNORMAL HIGH (ref 4.0–10.5)
nRBC: 0 % (ref 0.0–0.2)

## 2019-02-13 LAB — BASIC METABOLIC PANEL
Anion gap: 14 (ref 5–15)
BUN: 22 mg/dL (ref 8–23)
CO2: 19 mmol/L — ABNORMAL LOW (ref 22–32)
Calcium: 9.2 mg/dL (ref 8.9–10.3)
Chloride: 101 mmol/L (ref 98–111)
Creatinine, Ser: 1.95 mg/dL — ABNORMAL HIGH (ref 0.61–1.24)
GFR calc Af Amer: 37 mL/min — ABNORMAL LOW (ref >60)
GFR calc non Af Amer: 32 mL/min — ABNORMAL LOW (ref >60)
Glucose, Bld: 324 mg/dL — ABNORMAL HIGH (ref 70–99)
Potassium: 3.9 mmol/L (ref 3.5–5.1)
Sodium: 134 mmol/L — ABNORMAL LOW (ref 135–145)

## 2019-02-13 LAB — TSH: TSH: 2.214 u[IU]/mL (ref 0.350–4.500)

## 2019-02-13 LAB — MAGNESIUM: Magnesium: 1.9 mg/dL (ref 1.7–2.4)

## 2019-02-13 LAB — CBG MONITORING, ED: Glucose-Capillary: 304 mg/dL — ABNORMAL HIGH (ref 70–99)

## 2019-02-13 LAB — BRAIN NATRIURETIC PEPTIDE: B Natriuretic Peptide: 154.5 pg/mL — ABNORMAL HIGH (ref 0.0–100.0)

## 2019-02-13 LAB — TROPONIN I: Troponin I: <0.03 ng/mL (ref <0.03)

## 2019-02-13 MED ORDER — LACTATED RINGERS IV BOLUS
500.0000 mL | Freq: Once | INTRAVENOUS | Status: AC
  Administered 2019-02-13: 500 mL via INTRAVENOUS

## 2019-02-13 NOTE — Discharge Instructions (Signed)

## 2019-02-13 NOTE — Telephone Encounter (Signed)
Copied from [place] [id number]. Topic: Quick Communication - Rx Refill/Question >> Feb 13, 2019  2:26 PM [place], [place] D wrote: Medication: glucose blood (RELION GLUCOSE TEST STRIPS) test strip/ReliOn Ultra Thin Lancets MISC/ Pt's daughter stated the pharmacy has sent over multiple refill requests with no result. Pt is now out of test strips and lancets. Please advise.  Has the patient contacted their pharmacy? Yes.   (Agent: If no, request that the patient contact the pharmacy for the refill.) (Agent: If yes, when and what did the pharmacy advise?)  Preferred Pharmacy (with phone number or street name): [id number] [place], [place] [phone] (Phone) [phone] (Fax)    Agent: Please be advised that RX refills may take up to 3 business days. We ask that you follow-up with your pharmacy.

## 2019-02-13 NOTE — ED Provider Notes (Signed)
[facility] Provider Note   CSN: [id number] Arrival date & time: 02/13/19  1649     History   Chief Complaint No chief complaint on file.   HPI Eric Wade is a 78 y.o. male.     The history is provided by the patient, the EMS personnel and medical records.  Loss of Consciousness Episode history:  Single Most recent episode:  Today Duration:  30 seconds Timing:  Constant Progression:  Resolved Chronicity:  New Context: normal activity and sitting down   Context: not blood draw, not dehydration, not exertion, not inactivity, not medication change, not sight of blood and not standing up   Witnessed: yes   Relieved by:  Nothing Worsened by:  Nothing Ineffective treatments:  Eating, drinking, lying down and sitting up Associated symptoms: malaise/fatigue and weakness   Associated symptoms: no diaphoresis, no difficulty breathing, no fever, no focal sensory loss, no focal weakness, no nausea, no recent fall, no recent injury, no shortness of breath and no vomiting   Risk factors: no coronary artery disease   Risk factors comment:  No hx of seizures   Past Medical History:  Diagnosis Date  . Allergic rhinitis 08/25/2016  . DIABETES MELLITUS, TYPE II 05/08/2007  . ERECTILE DYSFUNCTION 05/08/2007  . GOUT 07/01/2007  . HYPERLIPIDEMIA 05/08/2007  . HYPERTENSION 05/08/2007  . OBESITY 05/08/2007  . Preventative health care 12/28/2010  . Prostate cancer ([place]) 07/02/2008   Qualifier: Diagnosis of  By: Jenny Reichmann MD, Hunt Oris   . PSA, INCREASED 07/02/2008  . RENAL INSUFFICIENCY 07/01/2007  . Seasonal allergies 12/31/2018    Patient Active Problem List   Diagnosis Date Noted  . Increased ammonia level 01/10/2019  . Hypokalemia   . Hypophosphatemia   . Acute metabolic encephalopathy   . Encephalopathy acute   . Stroke ([place]) 12/28/2018  . Allergic rhinitis 08/25/2016  . Preventative health care 12/28/2010  . Prostate cancer ([place]) 07/02/2008  .  GOUT 07/01/2007  . CKD (chronic kidney disease) stage 3, GFR 30-59 ml/min ([facility]) 07/01/2007  . Insulin dependent diabetes mellitus ([place]) 05/08/2007  . Hyperlipidemia 05/08/2007  . OBESITY 05/08/2007  . ERECTILE DYSFUNCTION 05/08/2007  . Essential hypertension 05/08/2007    History reviewed. No pertinent surgical history.      Home Medications    Prior to Admission medications   Medication Sig Start Date End Date Taking? Authorizing Provider  aspirin 81 MG EC tablet Take 81 mg by mouth daily.      [provider]  atorvastatin (LIPITOR) 20 MG tablet TAKE 1 TABLET (20 MG TOTAL) BY MOUTH DAILY AT 6 PM. 01/26/19   [name], MD  blood glucose meter kit and supplies KIT Pt receives from [facility] 01/03/19   Florencia Reasons, MD  blood glucose meter kit and supplies KIT Dispense based on patient and insurance preference. Use up to four times daily as directed. (FOR ICD-9 250.00, 250.01). 01/03/19   Florencia Reasons, MD  Blood Pressure Monitoring (RELION BLOOD PRESSURE MONITOR) KIT Use to check blood sugars twice a day 01/05/19   [name], MD  diltiazem (DILT-XR) 240 MG 24 hr capsule Take 1 capsule (240 mg total) by mouth daily. 01/05/19   [name], MD  glucose blood (RELION GLUCOSE TEST STRIPS) test strip Use to check blood sugars twice a day 01/05/19   [name], MD  hydrALAZINE (APRESOLINE) 25 MG tablet Take 1 tablet (25 mg total) by mouth 3 (three) times daily. 01/03/19  01/03/20  Florencia Reasons, MD  insulin NPH-regular Human (70-30) 100 UNIT/ML injection Inject 8 Units into the skin 2 (two) times daily with a meal. 01/03/19   Florencia Reasons, MD  isosorbide mononitrate (IMDUR) 30 MG 24 hr tablet TAKE 1 TABLET BY MOUTH EVERY DAY 02/03/19   [name], MD  lactulose (CHRONULAC) 10 GM/15ML solution Take 15 mLs (10 g total) by mouth daily. 01/03/19   Florencia Reasons, MD  Lancet Devices (AUTO-LANCET) MISC Pt received from [facility] 01/10/15   [name], MD  lisinopril (ZESTRIL) 20 MG tablet Take 1 tablet (20 mg total) by  mouth daily. Must keep May 12th appt for future refills 01/05/19   [name], MD  Maltodextrin-Xanthan Gum ([place]) POWD For nectar thick liquid 01/03/19   Florencia Reasons, MD  ReliOn Ultra Thin Lancets MISC Use to help check blood sugars twice a day 01/05/19   [name], MD  TRUE METRIX BLOOD GLUCOSE TEST test strip TEST AS DIRECTED TWICE PER DAY 01/25/18   [name], MD    Family History Family History  Problem Relation Age of Onset  . Hypertension Other     Social History Social History   Tobacco Use  . Smoking status: Former Research scientist (life sciences)  . Smokeless tobacco: Never Used  Substance Use Topics  . Alcohol use: No  . Drug use: No     Allergies   Nsaids   Review of Systems Review of Systems  Constitutional: Positive for malaise/fatigue. Negative for diaphoresis and fever.  Respiratory: Negative for shortness of breath.   Cardiovascular: Positive for syncope.  Gastrointestinal: Negative for nausea and vomiting.  Neurological: Positive for weakness. Negative for focal weakness.  All other systems reviewed and are negative.    Physical Exam Updated Vital Signs BP (!) 126/46 (BP Location: Left Arm)   Pulse (!) 56   Temp 98.4 F (36.9 C) (Oral)   Resp 16   SpO2 94%   Physical Exam Vitals signs and nursing note reviewed.  Constitutional:      Appearance: He is well-developed.  HENT:     Head: Normocephalic and atraumatic.     Mouth/Throat:     Mouth: Mucous membranes are moist.     Comments: Uvula midline No obvious asymmetric palatine elevation No obvious swellings posterior oropharynx No obvious swellings appreciated on neck exam Patient able to move neck in all directions without difficulty, tolerating secretions appropriately  Eyes:     Extraocular Movements: Extraocular movements intact.     Conjunctiva/sclera: Conjunctivae normal.     Pupils: Pupils are equal, round, and reactive to light.  Neck:     Musculoskeletal: Neck supple.   Cardiovascular:     Rate and Rhythm: Normal rate.  Pulmonary:     Effort: Pulmonary effort is normal. No respiratory distress.     Breath sounds: No stridor. No wheezing or rhonchi.  Abdominal:     Palpations: Abdomen is soft.     Tenderness: There is no abdominal tenderness.  Musculoskeletal:     Right lower leg: No edema.     Left lower leg: No edema.  Skin:    General: Skin is warm and dry.  Neurological:     General: No focal deficit present.     Mental Status: He is alert and oriented to person, place, and time.     Comments:  5/5 motor strength bilateral upper extremities 5/5 motor strength bilateral lower extremities No sensory deficits bilateral upper extremities, bilateral  lower extremities 2+ pulses throughout Normal finger-to-nose testing bilateral upper extremities Normal heel-to-shin testing bilateral lower extremities No obvious pronator drift  Visual acuity at baseline, patient states he has difficulty with visual acuity in left eye, is having surgery with ophthalmology in the near future for correction Visual acuity right eye at baseline, no obvious visual field cuts Cranial nerves II through XII grossly intact CN 2 (Optic): Visual fields intact to confrontation CN 3,4,6 (EOM): Pupils equal and reactive to light. Full extraocular eye movement without nystagmus.  CN 5 (Trigeminal): Facial sensation is normal, no weakness of masticatory muscles. CN 7 (Facial): No obvious facial droop CN 8 (Auditory): Auditory acuity grossly normal. CN 9,10 (Glossophar): The uvula is midline, the palate elevates symmetrically. CN 11 (spinal access): Head midline CN 12 (Hypoglossal): The tongue is midline.       ED Treatments / Results  Labs (all labs ordered are listed, but only abnormal results are displayed) Labs Reviewed  CBG MONITORING, ED - Abnormal; Notable for the following components:      Result Value   Glucose-Capillary 304 (*)    All other components within  normal limits    EKG    Radiology No results found.  Procedures Procedures (including critical care time)  Medications Ordered in ED Medications - No data to display   Initial Impression / Assessment and Plan / ED Course  I have reviewed the triage vital signs and the nursing notes.  Pertinent labs & imaging results that were available during my care of the patient were reviewed by me and considered in my medical decision making (see chart for details).        Medical Decision Making: Eric Wade is a 78 y.o. male who presented to the ED today with status post episode of partial responsiveness.  Past medical history significant for renal insufficiency, hypertension, hyperlipidemia, and DMT2 Reviewed and confirmed nursing documentation for past medical history, family history, social history.  On my initial exam, the pt was calm, cooperative, conversant, follows commands appropriately, GCS 15, not tachycardic, not hypotensive, afebrile, no increased work of breathing or respiratory distress, no signs of impending respiratory failure.   Clinical picture could be secondary to vasovagal etiology versus dehydration, patient has no concerning signs or symptoms of syncope including exertional chest pain or exertional syncope, no family history of sudden cardiac death or unexplained death, patient has had no chest pain, no tearing chest pain, no back pain no concerning EKG findings, specifically no signs of Brugada, HOCM, preexcitation, no prolonged QT.  Doubt aortic dissection.  Doubt VTE, patient is low risk [place], no asymmetric bilateral upper extremity or bilateral lower extremity swelling, no postictal period, no loss of consciousness, no incontinence, no witnessed tonic-clonic movements, no seizure history, doubt seizure.  Patient denies any headache prior to work headache now in the ED.  No focal neuro deficits on exam, doubt SAH or ICH.  Patient denies any recent blood loss, no  hematemesis, hemoptysis, hematochezia, no new visual symptoms, no dysarthria, no difficulty with ambulation, no hearing loss or tinnitus, doubt posterior circulation stroke, patient denies any alcohol or illicit drug use.  Patient reports noncompliance with diabetic medications secondary to not having glucose testing strips over the last few days  EKG (my interpretation):      NS rhythm with a rate of  54.      QRS 105. QTc 431.      No acute ischemic ST-T segment changes.  No acute changes suggestive of hyperkalemia.      No WPW, LQTS, or Brugada's Syndrome. When compared to previous ECGs from 12/31/18 change no new concerning changes found.  All radiology and laboratory studies reviewed independently and with my attending physician, agree with reading provided by radiologist unless otherwise noted.   Sodium 134, creatinine 1.9 up from 1.5 previously, magnesium 1.9, potassium 3.9, BNP 154, troponin negative, hemoglobin 12.4, CO2 19  Upon reassessing patient, patient was calm, resting comfortably, no new complaints, feels much improved compared with how he felt earlier Discussed case with patient, patient feels comfortable with discharge home, educated patient on getting glucose testing strips, being compliant with diabetic medications, seeing PCP for repeat electrolyte and creatinine check as an outpatient for early follow-up Based on the above findings, I believe patient is hemodynamically stable for discharge.  Patient educated about specific return precautions for given chief complaint and symptoms.  Patient educated about follow-up with PCP.  Patient expressed understanding of return precautions and need for follow-up.  Patient discharged.  The above care was discussed with and agreed upon by my attending physician. Emergency Department Medication Summary:  Medications  lactated ringers bolus 500 mL (has no administration in time range)        Final Clinical Impressions(s) / ED  Diagnoses   Final diagnoses:  None    ED Discharge Orders    None       Lonzo Candy, MD 02/13/19 2139    Sherwood Gambler, MD 02/14/19 1241

## 2019-02-13 NOTE — ED Triage Notes (Signed)
Per EMS: pt coming from home with daughter. Daughter reported to EMS pt had a syncopal episode. EMS reports a 30 second occurrence that appeared to be an absence seizure. Pt A/O after episode occured. Pt has a HX of stroke with no reported deficits.   EMS Vitals:  BP 138/58 CBG 313 Sp02 93% RA Pulse 87 Temp 97

## 2019-02-14 ENCOUNTER — Ambulatory Visit (HOSPITAL_COMMUNITY)
Admission: RE | Admit: 2019-02-14 | Discharge: 2019-02-14 | Disposition: A | Discharge status: Home / Self Care | Payer: Medicare HMO | Source: Ambulatory Visit | Attending: Internal Medicine | Admitting: Internal Medicine

## 2019-02-14 DIAGNOSIS — R1310 Dysphagia, unspecified: Secondary | ICD-10-CM | POA: Insufficient documentation

## 2019-02-14 MED ORDER — RELION BLOOD GLUCOSE TEST VI STRP: ORAL_STRIP | 5 refills | Status: DC

## 2019-02-15 ENCOUNTER — Other Ambulatory Visit: Payer: Self-pay | Admitting: Internal Medicine

## 2019-02-15 MED ORDER — BLOOD GLUCOSE METER KIT: PACK | 0 refills | Status: DC

## 2019-02-15 MED ORDER — LANCETS MISC: 11 refills | Status: DC

## 2019-02-15 NOTE — Telephone Encounter (Signed)
Done hardcopy to shirron  Since this is the third time to do this in the last month, we need to fax to the pharmacy and then find out what keeps going wrong

## 2019-02-15 NOTE — Telephone Encounter (Signed)
Faxed

## 2019-02-15 NOTE — Telephone Encounter (Signed)
We did not have the correct information in the pt's chaet. I have removed the Relion meter and supplies and have dropped a new meter. Please advise.

## 2019-02-15 NOTE — Telephone Encounter (Signed)
Medication Refill - Medication: Accuchek meter and test strips (The wrong type of test strips were sent to pharmacy.)  Has the patient contacted their pharmacy?Yes (Agent: If no, request that the patient contact the pharmacy for the refill.) (Agent: If yes, when and what did the pharmacy advise?)Contact PCP  Preferred Pharmacy (with phone number or street name):  [id number] - [place], [place] [phone] (Phone) [phone] (Fax)     Agent: Please be advised that RX refills may take up to 3 business days. We ask that you follow-up with your pharmacy.

## 2019-02-20 ENCOUNTER — Encounter (HOSPITAL_COMMUNITY): Payer: Self-pay | Admitting: Emergency Medicine

## 2019-02-20 ENCOUNTER — Emergency Department (HOSPITAL_COMMUNITY): Payer: Medicare HMO

## 2019-02-20 ENCOUNTER — Inpatient Hospital Stay (HOSPITAL_COMMUNITY)
Admission: EM | Admit: 2019-02-20 | Discharge: 2019-02-23 | DRG: 884 | Disposition: A | Discharge status: Home Health | Payer: Medicare HMO | Attending: Internal Medicine | Admitting: Internal Medicine

## 2019-02-20 ENCOUNTER — Other Ambulatory Visit: Payer: Self-pay

## 2019-02-20 DIAGNOSIS — R55 Syncope and collapse: Secondary | ICD-10-CM | POA: Diagnosis not present

## 2019-02-20 DIAGNOSIS — N179 Acute kidney failure, unspecified: Secondary | ICD-10-CM | POA: Diagnosis present

## 2019-02-20 DIAGNOSIS — R1310 Dysphagia, unspecified: Secondary | ICD-10-CM | POA: Diagnosis present

## 2019-02-20 DIAGNOSIS — E11621 Type 2 diabetes mellitus with foot ulcer: Secondary | ICD-10-CM | POA: Diagnosis present

## 2019-02-20 DIAGNOSIS — IMO0001 Reserved for inherently not codable concepts without codable children: Secondary | ICD-10-CM

## 2019-02-20 DIAGNOSIS — N183 Chronic kidney disease, stage 3 (moderate): Secondary | ICD-10-CM | POA: Diagnosis present

## 2019-02-20 DIAGNOSIS — Z794 Long term (current) use of insulin: Secondary | ICD-10-CM

## 2019-02-20 DIAGNOSIS — I10 Essential (primary) hypertension: Secondary | ICD-10-CM | POA: Diagnosis present

## 2019-02-20 DIAGNOSIS — M109 Gout, unspecified: Secondary | ICD-10-CM | POA: Diagnosis present

## 2019-02-20 DIAGNOSIS — Z8546 Personal history of malignant neoplasm of prostate: Secondary | ICD-10-CM

## 2019-02-20 DIAGNOSIS — R404 Transient alteration of awareness: Secondary | ICD-10-CM | POA: Diagnosis not present

## 2019-02-20 DIAGNOSIS — L97519 Non-pressure chronic ulcer of other part of right foot with unspecified severity: Secondary | ICD-10-CM | POA: Diagnosis present

## 2019-02-20 DIAGNOSIS — E1122 Type 2 diabetes mellitus with diabetic chronic kidney disease: Secondary | ICD-10-CM | POA: Diagnosis present

## 2019-02-20 DIAGNOSIS — E785 Hyperlipidemia, unspecified: Secondary | ICD-10-CM | POA: Diagnosis present

## 2019-02-20 DIAGNOSIS — D649 Anemia, unspecified: Secondary | ICD-10-CM | POA: Diagnosis present

## 2019-02-20 DIAGNOSIS — Z87891 Personal history of nicotine dependence: Secondary | ICD-10-CM

## 2019-02-20 DIAGNOSIS — Z9111 Patient's noncompliance with dietary regimen: Secondary | ICD-10-CM

## 2019-02-20 DIAGNOSIS — Z79899 Other long term (current) drug therapy: Secondary | ICD-10-CM

## 2019-02-20 DIAGNOSIS — Z09 Encounter for follow-up examination after completed treatment for conditions other than malignant neoplasm: Secondary | ICD-10-CM | POA: Insufficient documentation

## 2019-02-20 DIAGNOSIS — Z8673 Personal history of transient ischemic attack (TIA), and cerebral infarction without residual deficits: Secondary | ICD-10-CM

## 2019-02-20 DIAGNOSIS — I129 Hypertensive chronic kidney disease with stage 1 through stage 4 chronic kidney disease, or unspecified chronic kidney disease: Secondary | ICD-10-CM | POA: Diagnosis present

## 2019-02-20 DIAGNOSIS — R4182 Altered mental status, unspecified: Secondary | ICD-10-CM | POA: Diagnosis present

## 2019-02-20 DIAGNOSIS — Z888 Allergy status to other drugs, medicaments and biological substances status: Secondary | ICD-10-CM

## 2019-02-20 DIAGNOSIS — E11649 Type 2 diabetes mellitus with hypoglycemia without coma: Secondary | ICD-10-CM | POA: Diagnosis not present

## 2019-02-20 DIAGNOSIS — N189 Chronic kidney disease, unspecified: Secondary | ICD-10-CM | POA: Diagnosis present

## 2019-02-20 DIAGNOSIS — Z8249 Family history of ischemic heart disease and other diseases of the circulatory system: Secondary | ICD-10-CM

## 2019-02-20 DIAGNOSIS — E1165 Type 2 diabetes mellitus with hyperglycemia: Secondary | ICD-10-CM | POA: Diagnosis present

## 2019-02-20 DIAGNOSIS — E11622 Type 2 diabetes mellitus with other skin ulcer: Secondary | ICD-10-CM | POA: Diagnosis present

## 2019-02-20 DIAGNOSIS — L97309 Non-pressure chronic ulcer of unspecified ankle with unspecified severity: Secondary | ICD-10-CM | POA: Diagnosis present

## 2019-02-20 DIAGNOSIS — K760 Fatty (change of) liver, not elsewhere classified: Secondary | ICD-10-CM | POA: Diagnosis present

## 2019-02-20 DIAGNOSIS — E119 Type 2 diabetes mellitus without complications: Secondary | ICD-10-CM | POA: Diagnosis not present

## 2019-02-20 DIAGNOSIS — Z7982 Long term (current) use of aspirin: Secondary | ICD-10-CM

## 2019-02-20 DIAGNOSIS — Z20828 Contact with and (suspected) exposure to other viral communicable diseases: Secondary | ICD-10-CM | POA: Diagnosis present

## 2019-02-20 DIAGNOSIS — R413 Other amnesia: Secondary | ICD-10-CM | POA: Diagnosis present

## 2019-02-20 DIAGNOSIS — Z9841 Cataract extraction status, right eye: Secondary | ICD-10-CM

## 2019-02-20 LAB — CBC WITH DIFFERENTIAL/PLATELET
Abs Immature Granulocytes: 0.06 10*3/uL (ref 0.00–0.07)
Basophils Absolute: 0.1 10*3/uL (ref 0.0–0.1)
Basophils Relative: 1 %
Eosinophils Absolute: 0.1 10*3/uL (ref 0.0–0.5)
Eosinophils Relative: 1 %
HCT: 35.8 % — ABNORMAL LOW (ref 39.0–52.0)
Hemoglobin: 11.8 g/dL — ABNORMAL LOW (ref 13.0–17.0)
Immature Granulocytes: 1 %
Lymphocytes Relative: 16 %
Lymphs Abs: 1.5 10*3/uL (ref 0.7–4.0)
MCH: 29.6 pg (ref 26.0–34.0)
MCHC: 33 g/dL (ref 30.0–36.0)
MCV: 89.9 fL (ref 80.0–100.0)
Monocytes Absolute: 0.7 10*3/uL (ref 0.1–1.0)
Monocytes Relative: 7 %
Neutro Abs: 7 10*3/uL (ref 1.7–7.7)
Neutrophils Relative %: 74 %
Platelets: 271 10*3/uL (ref 150–400)
RBC: 3.98 MIL/uL — ABNORMAL LOW (ref 4.22–5.81)
RDW: 13.2 % (ref 11.5–15.5)
WBC: 9.4 10*3/uL (ref 4.0–10.5)
nRBC: 0 % (ref 0.0–0.2)

## 2019-02-20 LAB — COMPREHENSIVE METABOLIC PANEL
ALT: 13 U/L (ref 0–44)
AST: 17 U/L (ref 15–41)
Albumin: 3 g/dL — ABNORMAL LOW (ref 3.5–5.0)
Alkaline Phosphatase: 70 U/L (ref 38–126)
Anion gap: 10 (ref 5–15)
BUN: 16 mg/dL (ref 8–23)
CO2: 25 mmol/L (ref 22–32)
Calcium: 9.3 mg/dL (ref 8.9–10.3)
Chloride: 101 mmol/L (ref 98–111)
Creatinine, Ser: 1.81 mg/dL — ABNORMAL HIGH (ref 0.61–1.24)
GFR calc Af Amer: 40 mL/min — ABNORMAL LOW (ref >60)
GFR calc non Af Amer: 35 mL/min — ABNORMAL LOW (ref >60)
Glucose, Bld: 321 mg/dL — ABNORMAL HIGH (ref 70–99)
Potassium: 3.6 mmol/L (ref 3.5–5.1)
Sodium: 136 mmol/L (ref 135–145)
Total Bilirubin: 1 mg/dL (ref 0.3–1.2)
Total Protein: 7.3 g/dL (ref 6.5–8.1)

## 2019-02-20 LAB — AMMONIA
Ammonia: 26 umol/L (ref 9–35)
Ammonia: 30 umol/L (ref 9–35)

## 2019-02-20 LAB — CBG MONITORING, ED: Glucose-Capillary: 307 mg/dL — ABNORMAL HIGH (ref 70–99)

## 2019-02-20 LAB — GLUCOSE, CAPILLARY: Glucose-Capillary: 330 mg/dL — ABNORMAL HIGH (ref 70–99)

## 2019-02-20 LAB — SARS CORONAVIRUS 2 BY RT PCR (HOSPITAL ORDER, PERFORMED IN ~~LOC~~ HOSPITAL LAB): SARS Coronavirus 2: NEGATIVE

## 2019-02-20 MED ORDER — ONDANSETRON HCL 4 MG PO TABS: 4.0000 mg | ORAL_TABLET | Freq: Four times a day (QID) | ORAL | Status: DC | PRN

## 2019-02-20 MED ORDER — INSULIN ASPART 100 UNIT/ML ~~LOC~~ SOLN
0.0000 [IU] | Freq: Three times a day (TID) | SUBCUTANEOUS | Status: DC
  Administered 2019-02-21: 07:00:00 11 [IU] via SUBCUTANEOUS
  Administered 2019-02-21 (×2): 3 [IU] via SUBCUTANEOUS
  Administered 2019-02-22: 13:00:00 5 [IU] via SUBCUTANEOUS
  Administered 2019-02-22: 07:00:00 8 [IU] via SUBCUTANEOUS
  Administered 2019-02-23: 14:00:00 11 [IU] via SUBCUTANEOUS

## 2019-02-20 MED ORDER — DILTIAZEM HCL ER COATED BEADS 240 MG PO CP24
240.0000 mg | ORAL_CAPSULE | Freq: Every day | ORAL | Status: DC
  Administered 2019-02-21 – 2019-02-23 (×3): 240 mg via ORAL
  Filled 2019-02-20 (×4): qty 1

## 2019-02-20 MED ORDER — HEPARIN SODIUM (PORCINE) 5000 UNIT/ML IJ SOLN
5000.0000 [IU] | Freq: Three times a day (TID) | INTRAMUSCULAR | Status: DC
  Administered 2019-02-20 – 2019-02-23 (×9): 5000 [IU] via SUBCUTANEOUS
  Filled 2019-02-20 (×9): qty 1

## 2019-02-20 MED ORDER — INSULIN ASPART PROT & ASPART (70-30 MIX) 100 UNIT/ML ~~LOC~~ SUSP
8.0000 [IU] | Freq: Two times a day (BID) | SUBCUTANEOUS | Status: DC
  Administered 2019-02-21 – 2019-02-22 (×3): 8 [IU] via SUBCUTANEOUS
  Filled 2019-02-20: qty 10

## 2019-02-20 MED ORDER — ISOSORBIDE MONONITRATE ER 30 MG PO TB24
30.0000 mg | ORAL_TABLET | Freq: Every day | ORAL | Status: DC
  Administered 2019-02-21 – 2019-02-23 (×3): 30 mg via ORAL
  Filled 2019-02-20 (×3): qty 1

## 2019-02-20 MED ORDER — HYDRALAZINE HCL 25 MG PO TABS
25.0000 mg | ORAL_TABLET | Freq: Three times a day (TID) | ORAL | Status: DC
  Administered 2019-02-20 – 2019-02-22 (×5): 25 mg via ORAL
  Filled 2019-02-20 (×5): qty 1

## 2019-02-20 MED ORDER — ONDANSETRON HCL 4 MG/2ML IJ SOLN: 4.0000 mg | Freq: Four times a day (QID) | INTRAMUSCULAR | Status: DC | PRN

## 2019-02-20 MED ORDER — ATORVASTATIN CALCIUM 10 MG PO TABS
20.0000 mg | ORAL_TABLET | Freq: Every day | ORAL | Status: DC
  Administered 2019-02-21 – 2019-02-22 (×2): 20 mg via ORAL
  Filled 2019-02-20 (×2): qty 2

## 2019-02-20 MED ORDER — LISINOPRIL 20 MG PO TABS
20.0000 mg | ORAL_TABLET | Freq: Every day | ORAL | Status: DC
  Administered 2019-02-21 – 2019-02-23 (×3): 20 mg via ORAL
  Filled 2019-02-20 (×3): qty 1

## 2019-02-20 MED ORDER — TAMSULOSIN HCL 0.4 MG PO CAPS
0.4000 mg | ORAL_CAPSULE | Freq: Every day | ORAL | Status: DC
  Administered 2019-02-20 – 2019-02-22 (×3): 0.4 mg via ORAL
  Filled 2019-02-20 (×3): qty 1

## 2019-02-20 MED ORDER — SODIUM CHLORIDE 0.9 % IV SOLN
INTRAVENOUS | Status: DC
  Administered 2019-02-20 – 2019-02-23 (×3): via INTRAVENOUS

## 2019-02-20 MED ORDER — LACTULOSE 10 GM/15ML PO SOLN
10.0000 g | Freq: Every day | ORAL | Status: DC
  Administered 2019-02-21 – 2019-02-23 (×3): 10 g via ORAL
  Filled 2019-02-20 (×3): qty 30

## 2019-02-20 MED ORDER — SODIUM CHLORIDE 0.9 % IV BOLUS
1000.0000 mL | Freq: Once | INTRAVENOUS | Status: AC
  Administered 2019-02-20: 1000 mL via INTRAVENOUS

## 2019-02-20 MED ORDER — SODIUM CHLORIDE 0.9% FLUSH
3.0000 mL | Freq: Two times a day (BID) | INTRAVENOUS | Status: DC
  Administered 2019-02-20 – 2019-02-23 (×6): 3 mL via INTRAVENOUS

## 2019-02-20 MED ORDER — ASPIRIN EC 81 MG PO TBEC
81.0000 mg | DELAYED_RELEASE_TABLET | Freq: Every day | ORAL | Status: DC
  Administered 2019-02-21 – 2019-02-23 (×3): 81 mg via ORAL
  Filled 2019-02-20 (×4): qty 1

## 2019-02-20 NOTE — ED Notes (Signed)
Got patient undress on the monitor did ekg shown tyo er doctor patient is resting with call bell in reach

## 2019-02-20 NOTE — ED Notes (Signed)
ED TO INPATIENT HANDOFF REPORT  ED Nurse Name and Phone #: William Hamburger, RN [phone]  S Name/Age/Gender Eric Wade 78 y.o. male Room/Bed: 058C/058C  Code Status   Code Status: Full Code  Home/SNF/Other Home Patient oriented to: situation Is this baseline? Yes   Triage Complete: Triage complete  Chief Complaint syncope  Triage Note Per EMS: pt at home with wife and family, felt weak, family sat pt in a chair, at that time pt had a full syncopal episode. Family reports pt unresponsive for 10 minutes.  Pt denies any pain. A/O x4.    Allergies Allergies  Allergen Reactions  . Nsaids     REACTION: renal insufficiency    Level of Care/Admitting Diagnosis ED Disposition    ED Disposition Condition [facility] Area: [place] [[id number]]  Level of Care: Telemetry Medical [104]  I expect the patient will be discharged within 24 hours: No (not a candidate for 5C-Observation unit)  Covid Evaluation: Screening Protocol (No Symptoms)  Diagnosis: Neurology follow-up encounter [[id number]]  Admitting Physician: Norval Morton [[id number]]  Attending Physician: Norval Morton [[id number]]  PT Class (Do Not Modify): Observation [104]  PT Acc Code (Do Not Modify): Observation [10022]       B Medical/Surgery History Past Medical History:  Diagnosis Date  . Allergic rhinitis 08/25/2016  . DIABETES MELLITUS, TYPE II 05/08/2007  . ERECTILE DYSFUNCTION 05/08/2007  . GOUT 07/01/2007  . HYPERLIPIDEMIA 05/08/2007  . HYPERTENSION 05/08/2007  . OBESITY 05/08/2007  . Preventative health care 12/28/2010  . Prostate cancer ([place]) 07/02/2008   Qualifier: Diagnosis of  By: Jenny Reichmann MD, Hunt Oris   . PSA, INCREASED 07/02/2008  . RENAL INSUFFICIENCY 07/01/2007  . Seasonal allergies 12/31/2018   History reviewed. No pertinent surgical history.   A IV Location/Drains/Wounds Patient Lines/Drains/Airways Status   Active Line/Drains/Airways    Name:   Placement date:   Placement  time:   Site:   Days:   Peripheral IV 02/20/19 Left Forearm   02/20/19    1315    Forearm   less than 1          Intake/Output Last 24 hours No intake or output data in the 24 hours ending 02/20/19 1838  Labs/Imaging Results for orders placed or performed during the hospital encounter of 02/20/19 (from the past 48 hour(s))  CBG monitoring, ED     Status: Abnormal   Collection Time: 02/20/19  1:59 PM  Result Value Ref Range   Glucose-Capillary 307 (H) 70 - 99 mg/dL  CBC WITH DIFFERENTIAL     Status: Abnormal   Collection Time: 02/20/19  2:06 PM  Result Value Ref Range   WBC 9.4 4.0 - 10.5 K/uL   RBC 3.98 (L) 4.22 - 5.81 MIL/uL   Hemoglobin 11.8 (L) 13.0 - 17.0 g/dL   HCT 35.8 (L) 39.0 - 52.0 %   MCV 89.9 80.0 - 100.0 fL   MCH 29.6 26.0 - 34.0 pg   MCHC 33.0 30.0 - 36.0 g/dL   RDW 13.2 11.5 - 15.5 %   Platelets 271 150 - 400 K/uL   nRBC 0.0 0.0 - 0.2 %   Neutrophils Relative % 74 %   Neutro Abs 7.0 1.7 - 7.7 K/uL   Lymphocytes Relative 16 %   Lymphs Abs 1.5 0.7 - 4.0 K/uL   Monocytes Relative 7 %   Monocytes Absolute 0.7 0.1 - 1.0 K/uL   Eosinophils Relative 1 %   Eosinophils  Absolute 0.1 0.0 - 0.5 K/uL   Basophils Relative 1 %   Basophils Absolute 0.1 0.0 - 0.1 K/uL   Immature Granulocytes 1 %   Abs Immature Granulocytes 0.06 0.00 - 0.07 K/uL    Comment: Performed at [facility] [address], [place], [place] [address]  Comprehensive metabolic panel     Status: Abnormal   Collection Time: 02/20/19  2:06 PM  Result Value Ref Range   Sodium 136 135 - 145 mmol/L   Potassium 3.6 3.5 - 5.1 mmol/L   Chloride 101 98 - 111 mmol/L   CO2 25 22 - 32 mmol/L   Glucose, Bld 321 (H) 70 - 99 mg/dL   BUN 16 8 - 23 mg/dL   Creatinine, Ser 1.81 (H) 0.61 - 1.24 mg/dL   Calcium 9.3 8.9 - 10.3 mg/dL   Total Protein 7.3 6.5 - 8.1 g/dL   Albumin 3.0 (L) 3.5 - 5.0 g/dL   AST 17 15 - 41 U/L   ALT 13 0 - 44 U/L   Alkaline Phosphatase 70 38 - 126 U/L   Total Bilirubin 1.0 0.3 -  1.2 mg/dL   GFR calc non Af Amer 35 (L) >60 mL/min   GFR calc Af Amer 40 (L) >60 mL/min   Anion gap 10 5 - 15    Comment: Performed at [facility] [address], [place], [place] [address]  Ammonia     Status: None   Collection Time: 02/20/19  2:06 PM  Result Value Ref Range   Ammonia 26 9 - 35 umol/L    Comment: Performed at [facility], [place] [address] [place], [place] [address]  SARS Coronavirus 2 (CEPHEID - Performed in [place] hospital lab), Hosp Order     Status: None   Collection Time: 02/20/19  2:25 PM   Specimen: Nasopharyngeal Swab  Result Value Ref Range   SARS Coronavirus 2 NEGATIVE NEGATIVE    Comment: (NOTE) If result is NEGATIVE SARS-CoV-2 target nucleic acids are NOT DETECTED. The SARS-CoV-2 RNA is generally detectable in upper and lower  respiratory specimens during the acute phase of infection. The lowest  concentration of SARS-CoV-2 viral copies this assay can detect is 250  copies / mL. A negative result does not preclude SARS-CoV-2 infection  and should not be used as the sole basis for treatment or other  patient management decisions.  A negative result may occur with  improper specimen collection / handling, submission of specimen other  than nasopharyngeal swab, presence of viral mutation(s) within the  areas targeted by this assay, and inadequate number of viral copies  (<250 copies / mL). A negative result must be combined with clinical  observations, patient history, and epidemiological information. If result is POSITIVE SARS-CoV-2 target nucleic acids are DETECTED. The SARS-CoV-2 RNA is generally detectable in upper and lower  respiratory specimens dur ing the acute phase of infection.  Positive  results are indicative of active infection with SARS-CoV-2.  Clinical  correlation with patient history and other diagnostic information is  necessary to determine patient infection status.  Positive results do  not rule out bacterial  infection or co-infection with other viruses. If result is PRESUMPTIVE POSTIVE SARS-CoV-2 nucleic acids MAY BE PRESENT.   A presumptive positive result was obtained on the submitted specimen  and confirmed on repeat testing.  While 2019 novel coronavirus  (SARS-CoV-2) nucleic acids may be present in the submitted sample  additional confirmatory testing may be  necessary for epidemiological  and / or clinical management purposes  to differentiate between  SARS-CoV-2 and other Sarbecovirus currently known to infect humans.  If clinically indicated additional testing with an alternate test  methodology [phone]) is advised. The SARS-CoV-2 RNA is generally  detectable in upper and lower respiratory sp ecimens during the acute  phase of infection. The expected result is Negative. Fact Sheet for Patients:  StrictlyIdeas.no Fact Sheet for Healthcare Providers: BankingDealers.co.za This test is not yet approved or cleared by the [place] FDA and has been authorized for detection and/or diagnosis of SARS-CoV-2 by FDA under an Emergency Use Authorization (EUA).  This EUA will remain in effect (meaning this test can be used) for the duration of the COVID-19 declaration under Section 564(b)(1) of the Act, 21 U.S.C. section 360bbb-3(b)(1), unless the authorization is terminated or revoked sooner. Performed at [facility], [place] [address] [place], [place] [address]    Dg Chest 2 View  Result Date: 02/20/2019 CLINICAL DATA:  Status post fall, syncope EXAM: CHEST - 2 VIEW COMPARISON:  12/31/2018 FINDINGS: The heart size and mediastinal contours are within normal limits. Both lungs are clear. The visualized skeletal structures are unremarkable. IMPRESSION: No active cardiopulmonary disease. Electronically Signed   By: Kathreen Devoid   On: 02/20/2019 13:44   Ct Head Wo Contrast  Result Date: 02/20/2019 CLINICAL DATA:  Syncope with 10 minute  period of unresponsiveness EXAM: CT HEAD WITHOUT CONTRAST TECHNIQUE: Contiguous axial images were obtained from the base of the skull through the vertex without intravenous contrast. COMPARISON:  February 13, 2019. FINDINGS: Brain: Mild diffuse atrophy remains stable. There is no intracranial mass, hemorrhage, extra-axial fluid collection, or midline shift. Patchy small vessel disease in the centra semiovale bilaterally is stable. No acute infarct is demonstrable on this study. Vascular: There is no hyperdense vessel. There is calcification in each carotid siphon region. Skull: The bony calvarium appears intact. Sinuses/Orbits: There is mucosal thickening in several ethmoid air cells. Other visualized paranasal sinuses are clear. Orbits appear symmetric bilaterally except for previous cataract removal on the right. Other: Mastoid air cells are clear. IMPRESSION: Stable atrophy with periventricular small vessel disease. No acute infarct evident. No mass or hemorrhage. There are foci of arterial vascular calcification. Mucosal thickening in several ethmoid air cells noted. Electronically Signed   By: Lowella Grip III M.D.   On: 02/20/2019 13:42    Pending Labs Unresulted Labs (From admission, onward)    Start     Ordered   02/21/19 0500  CBC  Tomorrow morning,   R     02/20/19 1834   02/21/19 5176  Basic metabolic panel  Tomorrow morning,   R     02/20/19 1834   02/20/19 1834  Ammonia  Add-on,   AD     02/20/19 1834          Vitals/Pain Today's Vitals   02/20/19 1715 02/20/19 1730 02/20/19 1745 02/20/19 1800  BP: (!) 153/64 (!) 150/67 (!) 147/68 (!) 149/71  Pulse: (!) 55 (!) 57 (!) 56 (!) 56  Resp: 12 18 20 18   Temp:      TempSrc:      SpO2: 97% 96% 96% 95%  Height:      PainSc:        Isolation Precautions No active isolations  Medications Medications  aspirin EC tablet 81 mg (has no administration in time range)  atorvastatin (LIPITOR) tablet 20 mg (has no administration in time  range)  hydrALAZINE (APRESOLINE) tablet 25 mg (has no administration in time range)  isosorbide mononitrate (IMDUR) 24 hr tablet 30 mg (has no administration in time range)  diltiazem (DILACOR XR) 24 hr capsule 240 mg (has no administration in time range)  lisinopril (ZESTRIL) tablet 20 mg (has no administration in time range)  insulin aspart protamine- aspart (NOVOLOG MIX 70/30) injection 8 Units (has no administration in time range)  lactulose (CHRONULAC) 10 GM/15ML solution 10 g (has no administration in time range)  tamsulosin (FLOMAX) capsule 0.4 mg (has no administration in time range)  heparin injection 5,000 Units (has no administration in time range)  sodium chloride flush (NS) 0.9 % injection 3 mL (has no administration in time range)  ondansetron (ZOFRAN) tablet 4 mg (has no administration in time range)    Or  ondansetron (ZOFRAN) injection 4 mg (has no administration in time range)  insulin aspart (novoLOG) injection 0-15 Units (has no administration in time range)  sodium chloride 0.9 % bolus 1,000 mL (1,000 mLs Intravenous New Bag/Given 02/20/19 1445)    Mobility walks High fall risk(due to syncopal episode)   Focused Assessments Neuro Assessment Handoff:  Swallow screen pass? Yes  Cardiac Rhythm: Sinus bradycardia NIH Stroke Scale ( + Modified Stroke Scale Criteria)  Interval: Initial Level of Consciousness (1a.)   : Alert, keenly responsive LOC Questions (1b. )   +: Answers both questions correctly LOC Commands (1c. )   + : Performs both tasks correctly Best Gaze (2. )  +: Normal Visual (3. )  +: No visual loss Facial Palsy (4. )    : Normal symmetrical movements Motor Arm, Left (5a. )   +: No drift Motor Arm, Right (5b. )   +: No drift Motor Leg, Left (6a. )   +: No drift Motor Leg, Right (6b. )   +: No drift Limb Ataxia (7. ): Absent Sensory (8. )   +: Normal, no sensory loss Best Language (9. )   +: No aphasia Dysarthria (10. ):  Normal Extinction/Inattention (11.)   +: No Abnormality Modified SS Total  +: 0 Complete NIHSS TOTAL: 0     Neuro Assessment: Within Defined Limits Neuro Checks:   Initial (02/20/19 1341)  Last Documented NIHSS Modified Score: 0 (02/20/19 1341) Has TPA been given? No If patient is a Neuro Trauma and patient is going to OR before floor call report to [place] nurse: [phone] or [phone]     R Recommendations: See Admitting Provider Note  Report given to:   Additional Notes: Here for syncope witnessed by family member. Back to baseline at this time. VSS. Call if you have more questions.

## 2019-02-20 NOTE — ED Provider Notes (Signed)
[facility] EMERGENCY DEPARTMENT Provider Note   CSN: [id number] Arrival date & time: 02/20/19  1302    History   Chief Complaint Chief Complaint  Patient presents with  . Weakness  . Loss of Consciousness    HPI Eric Wade is a 78 y.o. male with a past medical history of HTN, hyperlipidemia, type 2 diabetes who presents to ED for syncopal episode that occurred prior to arrival.  Per patient, he was walking when all of a sudden "felt weak on the inside."  He did not feel that he was going to syncopized.  According to EMS family reports that he was unresponsive for about 10 minutes.  Patient is unsure how he regained consciousness.  He does admit that he has not eaten a meal in over 18 hours.  He is unsure what his recent blood sugar readings have been.  He reports compliance with his home medication.  Denies any head injury or neck pain, headache, vision changes, vomiting, diarrhea, history of seizures.  I spoke to patient's daughter [name]: States that patient has not eaten since last night as he was scheduled for cataract surgery today and was told to be n.p.o. after midnight.  She states that the past time he has been in the ED he had a "glazed over" look in his face.  States that they have tried to wake him up but were unable to do so.  States that the episode today lasted about 10 minutes.  They laid him down on the couch and he instantly woke up after this.  She does note that he has been "kind of weak all over, not wanting to eat or drink much, not wanting to do much."  She is unsure what may have caused the symptoms.  She denies any recent changes in medication or injuries/falls.    HPI  Past Medical History:  Diagnosis Date  . Allergic rhinitis 08/25/2016  . DIABETES MELLITUS, TYPE II 05/08/2007  . ERECTILE DYSFUNCTION 05/08/2007  . GOUT 07/01/2007  . HYPERLIPIDEMIA 05/08/2007  . HYPERTENSION 05/08/2007  . OBESITY 05/08/2007  . Preventative health care  12/28/2010  . Prostate cancer ([place]) 07/02/2008   Qualifier: Diagnosis of  By: Jenny Reichmann MD, Hunt Oris   . PSA, INCREASED 07/02/2008  . RENAL INSUFFICIENCY 07/01/2007  . Seasonal allergies 12/31/2018    Patient Active Problem List   Diagnosis Date Noted  . Increased ammonia level 01/10/2019  . Hypokalemia   . Hypophosphatemia   . Acute metabolic encephalopathy   . Encephalopathy acute   . Stroke ([place]) 12/28/2018  . Allergic rhinitis 08/25/2016  . Preventative health care 12/28/2010  . Prostate cancer ([place]) 07/02/2008  . GOUT 07/01/2007  . CKD (chronic kidney disease) stage 3, GFR 30-59 ml/min (HCC) 07/01/2007  . Insulin dependent diabetes mellitus ([place]) 05/08/2007  . Hyperlipidemia 05/08/2007  . OBESITY 05/08/2007  . ERECTILE DYSFUNCTION 05/08/2007  . Essential hypertension 05/08/2007    History reviewed. No pertinent surgical history.      Home Medications    Prior to Admission medications   Medication Sig Start Date End Date Taking? Authorizing Provider  aspirin 81 MG EC tablet Take 81 mg by mouth daily.      [provider]  atorvastatin (LIPITOR) 20 MG tablet TAKE 1 TABLET (20 MG TOTAL) BY MOUTH DAILY AT 6 PM. 01/26/19   [name], MD  blood glucose meter kit and supplies KIT Pt receives from [facility] 01/03/19   Florencia Reasons, MD  blood glucose meter kit and supplies KIT Dispense based on patient and insurance preference. Use up to four times daily as directed. (FOR ICD-9 250.00, 250.01). 01/03/19   Florencia Reasons, MD  blood glucose meter kit and supplies Use four times daily as directed E11.9 02/15/19   [name], MD  diltiazem (DILT-XR) 240 MG 24 hr capsule Take 1 capsule (240 mg total) by mouth daily. 01/05/19   [name], MD  hydrALAZINE (APRESOLINE) 25 MG tablet Take 1 tablet (25 mg total) by mouth 3 (three) times daily. 01/03/19 01/03/20  Florencia Reasons, MD  insulin NPH-regular Human (70-30) 100 UNIT/ML injection Inject 8 Units into the skin 2 (two) times daily with a meal. 01/03/19    Florencia Reasons, MD  isosorbide mononitrate (IMDUR) 30 MG 24 hr tablet TAKE 1 TABLET BY MOUTH EVERY DAY 02/03/19   [name], MD  lactulose (CHRONULAC) 10 GM/15ML solution Take 15 mLs (10 g total) by mouth daily. 01/03/19   Florencia Reasons, MD  Lancet Devices (AUTO-LANCET) [place] Pt received from [facility] 01/10/15   [name], MD  Lancets MISC Use as directed four times per day E11.9 02/15/19   [name], MD  lisinopril (ZESTRIL) 20 MG tablet Take 1 tablet (20 mg total) by mouth daily. Must keep May 12th appt for future refills 01/05/19   [name], MD  Maltodextrin-Xanthan Gum ([place]) POWD For nectar thick liquid 01/03/19   Florencia Reasons, MD    Family History Family History  Problem Relation Age of Onset  . Hypertension Other     Social History Social History   Tobacco Use  . Smoking status: Former Research scientist (life sciences)  . Smokeless tobacco: Never Used  Substance Use Topics  . Alcohol use: No  . Drug use: No     Allergies   Nsaids   Review of Systems Review of Systems  Constitutional: Negative for appetite change, chills and fever.  HENT: Negative for ear pain, rhinorrhea, sneezing and sore throat.   Eyes: Negative for photophobia and visual disturbance.  Respiratory: Negative for cough, chest tightness, shortness of breath and wheezing.   Cardiovascular: Negative for chest pain and palpitations.  Gastrointestinal: Negative for abdominal pain, blood in stool, constipation, diarrhea, nausea and vomiting.  Genitourinary: Negative for dysuria, hematuria and urgency.  Musculoskeletal: Negative for myalgias.  Skin: Negative for rash.  Neurological: Positive for syncope. Negative for dizziness, weakness and light-headedness.     Physical Exam Updated Vital Signs BP (!) 152/72   Pulse (!) 54   Temp (!) 97.5 F (36.4 C) (Oral)   Resp 18   Ht _0  (1.88 m)   SpO2 96%   BMI 23.37 kg/m   Physical Exam Vitals signs and nursing note reviewed.  Constitutional:      General: He  is not in acute distress.    Appearance: He is well-developed.  HENT:     Head: Normocephalic and atraumatic.     Nose: Nose normal.  Eyes:     General: No scleral icterus.       Left eye: No discharge.     Conjunctiva/sclera: Conjunctivae normal.  Neck:     Musculoskeletal: Normal range of motion and neck supple.  Cardiovascular:     Rate and Rhythm: Normal rate and regular rhythm.     Heart sounds: Normal heart sounds. No murmur. No friction rub. No gallop.   Pulmonary:     Effort: Pulmonary effort is normal. No respiratory distress.  Breath sounds: Normal breath sounds.  Abdominal:     General: Bowel sounds are normal. There is no distension.     Palpations: Abdomen is soft.     Tenderness: There is no abdominal tenderness. There is no guarding.  Musculoskeletal: Normal range of motion.  Skin:    General: Skin is warm and dry.     Findings: No rash.  Neurological:     General: No focal deficit present.     Mental Status: He is alert and oriented to person, place, and time.     Cranial Nerves: No cranial nerve deficit.     Sensory: No sensory deficit.     Motor: No weakness or abnormal muscle tone.     Coordination: Coordination normal.     Comments: Pupils reactive. No facial asymmetry noted. Cranial nerves appear grossly intact. Sensation intact to light touch on face, BUE and BLE. Strength 5/5 in BUE and BLE. Normal patellar reflexes bilaterally.      ED Treatments / Results  Labs (all labs ordered are listed, but only abnormal results are displayed) Labs Reviewed  CBC WITH DIFFERENTIAL/PLATELET - Abnormal; Notable for the following components:      Result Value   RBC 3.98 (*)    Hemoglobin 11.8 (*)    HCT 35.8 (*)    All other components within normal limits  COMPREHENSIVE METABOLIC PANEL - Abnormal; Notable for the following components:   Glucose, Bld 321 (*)    Creatinine, Ser 1.81 (*)    Albumin 3.0 (*)    GFR calc non Af Amer 35 (*)    GFR calc Af Amer  40 (*)    All other components within normal limits  CBG MONITORING, ED - Abnormal; Notable for the following components:   Glucose-Capillary 307 (*)    All other components within normal limits  SARS CORONAVIRUS 2 (HOSPITAL ORDER, [facility])  AMMONIA    EKG EKG Interpretation  Date/Time:  Monday February 20 2019 13:06:47 EDT Ventricular Rate:  49 PR Interval:    QRS Duration: 142 QT Interval:  442 QTC Calculation: 399 R Axis:   43 Text Interpretation:  Junctional rhythm Nonspecific intraventricular conduction delay Borderline T abnormalities, lateral leads Confirmed by Dene Gentry [phone]) on 02/20/2019 1:37:35 PM   Radiology Dg Chest 2 View  Result Date: 02/20/2019 CLINICAL DATA:  Status post fall, syncope EXAM: CHEST - 2 VIEW COMPARISON:  12/31/2018 FINDINGS: The heart size and mediastinal contours are within normal limits. Both lungs are clear. The visualized skeletal structures are unremarkable. IMPRESSION: No active cardiopulmonary disease. Electronically Signed   By: Kathreen Devoid   On: 02/20/2019 13:44   Ct Head Wo Contrast  Result Date: 02/20/2019 CLINICAL DATA:  Syncope with 10 minute period of unresponsiveness EXAM: CT HEAD WITHOUT CONTRAST TECHNIQUE: Contiguous axial images were obtained from the base of the skull through the vertex without intravenous contrast. COMPARISON:  February 13, 2019. FINDINGS: Brain: Mild diffuse atrophy remains stable. There is no intracranial mass, hemorrhage, extra-axial fluid collection, or midline shift. Patchy small vessel disease in the centra semiovale bilaterally is stable. No acute infarct is demonstrable on this study. Vascular: There is no hyperdense vessel. There is calcification in each carotid siphon region. Skull: The bony calvarium appears intact. Sinuses/Orbits: There is mucosal thickening in several ethmoid air cells. Other visualized paranasal sinuses are clear. Orbits appear symmetric bilaterally except for  previous cataract removal on the right. Other: Mastoid air cells are clear. IMPRESSION:  Stable atrophy with periventricular small vessel disease. No acute infarct evident. No mass or hemorrhage. There are foci of arterial vascular calcification. Mucosal thickening in several ethmoid air cells noted. Electronically Signed   By: Lowella Grip III M.D.   On: 02/20/2019 13:42    Procedures Procedures (including critical care time)  Medications Ordered in ED Medications  sodium chloride 0.9 % bolus 1,000 mL (1,000 mLs Intravenous New Bag/Given 02/20/19 1445)     Initial Impression / Assessment and Plan / ED Course  I have reviewed the triage vital signs and the nursing notes.  Pertinent labs & imaging results that were available during my care of the patient were reviewed by me and considered in my medical decision making (see chart for details).        78yo M presents to ED for syncope.  Family states that he had an episode that lasted about 10 minutes today while seated in a chair in which she was unresponsive but did have a pulse.  This improved after they laid him down on the couch.  Patient was seen and evaluated 1 week ago for similar symptoms which lasted briefly that time.  Patient himself states that he is not currently having any symptoms, no headache, vision changes, vomiting, neck pain, numbness in arms or legs.  Work-up last week was reassuring.  Will obtain repeat lab work, imaging. Admission likely as this is second visit for syncope given his age and risk factors.   Labwork including CBC, CMP unremarkable. CBG elevated to 300s, fluids given. Ammonia level is normal. Covid testing pending. Concern for neurological vs cardiac etiology, he is at risk for both based on his comorbidities. Will admit to hospitalist for further management.  Patient discussed with my attending, Dr. Francia Greaves.     Portions of this note were generated with Lobby[employer]. Dictation errors  may occur despite best attempts at proofreading.  Final Clinical Impressions(s) / ED Diagnoses   Final diagnoses:  Syncope, unspecified syncope type    ED Discharge Orders    None       Delia Heady, PA-C 02/20/19 1520    Valarie Merino, MD 03/06/19 1204

## 2019-02-20 NOTE — H&P (Signed)
History and Physical    Eric OMALLEY QQV:[id number] DOB: 04/01/1941 DOA: 02/20/2019  Referring MD/NP/PA: Delia Heady PCP: [name], MD  Patient coming from: Home via EMS  Chief Complaint: Unresponsiveness  I have personally briefly reviewed patient's old medical records in [place]   HPI: Eric Wade is a 78 y.o. male with medical history significant of HTN, HLD, DM type II, obesity, dysphagia, and prostate cancer; who presented after having episode of unresponsiveness at home.  Patient's daughter reported that he had a glazed look over his face and was not responding to them for approximately 10 minutes.  They laid him down on the couch and he reportedly woke up.  The patient reports that he is had a dry cough and his heart is always beating fast.  At home family noted that the patient has not been wanting to eat, drink, or do much of anything at all.  He admits to urinating frequently and feeling weak all over.  Patient was just admitted into the hospital from 4/29-5/5  with strokelike symptoms and acute metabolic encephalopathy possibly related with uncontrolled diabetes.  During the hospital stay patient required intubation and was extubated on 5/1.  MRI was negative for any acute abnormalities. Ammonia levels were noted to be elevated up to 109, LFTs within normal limits, and mild fatty liver disease noted on abdominal ultrasound.  Patient's hemoglobin A1c of 13.6 and was started on 70/30 insulin.  He was also noted to have dysphagia for which he was placed on a dysphagia 3 diet prior to discharge.  Skilled nursing facility was recommended, but patient declined this as he reported that the main rules.  ED Course: Upon admission to the emergency department patient was noted to be afebrile, pulse 47-99, respirations 12-22, and all other vital signs maintained.  Labs revealed WBC 9.4, hemoglobin 11.8, BUN 16, creatinine 1.81.  COVID-19 screening test negative.  Chest x-ray  negative for any acute abnormalities.  CT scan of the brain showed no acute signs of stroke.  Patient was given 1 L normal saline IV fluids.  TRH called to admit.  Review of Systems  Constitutional: Positive for malaise/fatigue. Negative for chills, fever and weight loss.  HENT: Negative for ear discharge and nosebleeds.   Eyes: Negative for photophobia and pain.  Respiratory: Positive for cough and shortness of breath.   Cardiovascular: Negative for chest pain and leg swelling.  Gastrointestinal: Negative for diarrhea, nausea and vomiting.  Genitourinary: Positive for frequency. Negative for dysuria.  Musculoskeletal: Negative for joint pain and myalgias.  Skin: Negative for itching and rash.  Neurological: Positive for loss of consciousness and weakness.  Endo/Heme/Allergies: Negative for polydipsia.    Past Medical History:  Diagnosis Date   Allergic rhinitis 08/25/2016   DIABETES MELLITUS, TYPE II 05/08/2007   ERECTILE DYSFUNCTION 05/08/2007   GOUT 07/01/2007   HYPERLIPIDEMIA 05/08/2007   HYPERTENSION 05/08/2007   OBESITY 05/08/2007   Preventative health care 12/28/2010   Prostate cancer ([place]) 07/02/2008   Qualifier: Diagnosis of  By: Jenny Reichmann MD, Hunt Oris    PSA, INCREASED 07/02/2008   RENAL INSUFFICIENCY 07/01/2007   Seasonal allergies 12/31/2018    History reviewed. No pertinent surgical history.   reports that he has quit smoking. He has never used smokeless tobacco. He reports that he does not drink alcohol or use drugs.  Allergies  Allergen Reactions   Nsaids     REACTION: renal insufficiency    Family History  Problem Relation  Age of Onset   Hypertension Other     Prior to Admission medications   Medication Sig Start Date End Date Taking? Authorizing Provider  aspirin 81 MG EC tablet Take 81 mg by mouth daily.     Yes [provider]  atorvastatin (LIPITOR) 20 MG tablet TAKE 1 TABLET (20 MG TOTAL) BY MOUTH DAILY AT 6 PM. 01/26/19  Yes [name],  MD  diltiazem (DILT-XR) 240 MG 24 hr capsule Take 1 capsule (240 mg total) by mouth daily. 01/05/19  Yes [name], MD  hydrALAZINE (APRESOLINE) 25 MG tablet Take 1 tablet (25 mg total) by mouth 3 (three) times daily. 01/03/19 01/03/20 Yes Florencia Reasons, MD  insulin NPH-regular Human (70-30) 100 UNIT/ML injection Inject 8 Units into the skin 2 (two) times daily with a meal. 01/03/19  Yes Florencia Reasons, MD  isosorbide mononitrate (IMDUR) 30 MG 24 hr tablet TAKE 1 TABLET BY MOUTH EVERY DAY Patient taking differently: Take 30 mg by mouth daily.  02/03/19  Yes [name], MD  lactulose (CHRONULAC) 10 GM/15ML solution Take 15 mLs (10 g total) by mouth daily. 01/03/19  Yes Florencia Reasons, MD  lisinopril (ZESTRIL) 20 MG tablet Take 1 tablet (20 mg total) by mouth daily. Must keep May 12th appt for future refills 01/05/19  Yes [name], MD  tamsulosin (FLOMAX) 0.4 MG CAPS capsule Take 0.4 mg by mouth at bedtime. 02/06/19  Yes [provider]  blood glucose meter kit and supplies KIT Pt receives from [facility] 01/03/19   Florencia Reasons, MD  blood glucose meter kit and supplies KIT Dispense based on patient and insurance preference. Use up to four times daily as directed. (FOR ICD-9 250.00, 250.01). 01/03/19   Florencia Reasons, MD  blood glucose meter kit and supplies Use four times daily as directed E11.9 02/15/19   [name], MD  Lancet Devices (AUTO-LANCET) MISC Pt received from [facility] 01/10/15   [name], MD  Lancets MISC Use as directed four times per day E11.9 02/15/19   [name], MD  Maltodextrin-Xanthan Gum ([place]) POWD For nectar thick liquid Patient not taking: Reported on 02/20/2019 01/03/19   Florencia Reasons, MD    Physical Exam:  Constitutional: Elderly male in no acute distress at this time Vitals:   02/20/19 1700 02/20/19 1715 02/20/19 1730 02/20/19 1745  BP: (!) 148/69 (!) 153/64 (!) 150/67 (!) 147/68  Pulse: (!) 55 (!) 55 (!) 57 (!) 56  Resp: _0 Temp:      TempSrc:      SpO2: 98%  97% 96% 96%  Height:       Eyes: PERRL, lids and conjunctivae normal ENMT: Mucous membranes are dry. Posterior pharynx clear of any exudate or lesions.  Neck: normal, supple, no masses, no thyromegaly Respiratory: clear to auscultation bilaterally, no wheezing, no crackles. Normal respiratory effort. No accessory muscle use.  Cardiovascular: Bradycardic, no murmurs / rubs / gallops. No extremity edema. 2+ pedal pulses. No carotid bruits.  Abdomen: no tenderness, no masses palpated. No hepatosplenomegaly. Bowel sounds positive.  Musculoskeletal: no clubbing / cyanosis. No joint deformity upper and lower extremities. Good ROM, no contractures. Normal muscle tone.  Skin: no rashes, lesions, ulcers. No induration.  Poor skin turgor. Neurologic: CN 2-12 grossly intact. Sensation intact, DTR normal. Strength 5/5 in all 4.  Psychiatric: Alert and oriented x 3. Normal mood.     Labs on Admission: I have personally reviewed following  labs and imaging studies  CBC: Recent Labs  Lab 02/20/19 1406  WBC 9.4  NEUTROABS 7.0  HGB 11.8*  HCT 35.8*  MCV 89.9  PLT 767   Basic Metabolic Panel: Recent Labs  Lab 02/20/19 1406  NA 136  K 3.6  CL 101  CO2 25  GLUCOSE 321*  BUN 16  CREATININE 1.81*  CALCIUM 9.3   GFR: CrCl cannot be calculated (Unknown ideal weight.). Liver Function Tests: Recent Labs  Lab 02/20/19 1406  AST 17  ALT 13  ALKPHOS 70  BILITOT 1.0  PROT 7.3  ALBUMIN 3.0*   No results for input(s): LIPASE, AMYLASE in the last 168 hours. Recent Labs  Lab 02/20/19 1406  AMMONIA 26   Coagulation Profile: No results for input(s): INR, PROTIME in the last 168 hours. Cardiac Enzymes: No results for input(s): CKTOTAL, CKMB, CKMBINDEX, TROPONINI in the last 168 hours. BNP (last 3 results) No results for input(s): PROBNP in the last 8760 hours. HbA1C: No results for input(s): HGBA1C in the last 72 hours. CBG: Recent Labs  Lab 02/20/19 1359  GLUCAP 307*   Lipid  Profile: No results for input(s): CHOL, HDL, LDLCALC, TRIG, CHOLHDL, LDLDIRECT in the last 72 hours. Thyroid Function Tests: No results for input(s): TSH, T4TOTAL, FREET4, T3FREE, THYROIDAB in the last 72 hours. Anemia Panel: No results for input(s): VITAMINB12, FOLATE, FERRITIN, TIBC, IRON, RETICCTPCT in the last 72 hours. Urine analysis:    Component Value Date/Time   COLORURINE STRAW (A) 12/28/2018 2253   APPEARANCEUR CLEAR 12/28/2018 2253   LABSPEC 1.031 (H) 12/28/2018 2253   PHURINE 7.0 12/28/2018 2253   GLUCOSEU >=500 (A) 12/28/2018 2253   GLUCOSEU NEGATIVE 03/11/2017 1553   HGBUR SMALL (A) 12/28/2018 2253   BILIRUBINUR NEGATIVE 12/28/2018 2253   KETONESUR NEGATIVE 12/28/2018 2253   PROTEINUR 30 (A) 12/28/2018 2253   UROBILINOGEN 0.2 03/11/2017 1553   NITRITE NEGATIVE 12/28/2018 2253   LEUKOCYTESUR NEGATIVE 12/28/2018 2253   Sepsis Labs: Recent Results (from the past 240 hour(s))  SARS Coronavirus 2 (CEPHEID - Performed in [place] hospital lab), Hosp Order     Status: None   Collection Time: 02/20/19  2:25 PM   Specimen: Nasopharyngeal Swab  Result Value Ref Range Status   SARS Coronavirus 2 NEGATIVE NEGATIVE Final    Comment: (NOTE) If result is NEGATIVE SARS-CoV-2 target nucleic acids are NOT DETECTED. The SARS-CoV-2 RNA is generally detectable in upper and lower  respiratory specimens during the acute phase of infection. The lowest  concentration of SARS-CoV-2 viral copies this assay can detect is 250  copies / mL. A negative result does not preclude SARS-CoV-2 infection  and should not be used as the sole basis for treatment or other  patient management decisions.  A negative result may occur with  improper specimen collection / handling, submission of specimen other  than nasopharyngeal swab, presence of viral mutation(s) within the  areas targeted by this assay, and inadequate number of viral copies  (<250 copies / mL). A negative result must be combined with  clinical  observations, patient history, and epidemiological information. If result is POSITIVE SARS-CoV-2 target nucleic acids are DETECTED. The SARS-CoV-2 RNA is generally detectable in upper and lower  respiratory specimens dur ing the acute phase of infection.  Positive  results are indicative of active infection with SARS-CoV-2.  Clinical  correlation with patient history and other diagnostic information is  necessary to determine patient infection status.  Positive results do  not rule out bacterial  infection or co-infection with other viruses. If result is PRESUMPTIVE POSTIVE SARS-CoV-2 nucleic acids MAY BE PRESENT.   A presumptive positive result was obtained on the submitted specimen  and confirmed on repeat testing.  While 2019 novel coronavirus  (SARS-CoV-2) nucleic acids may be present in the submitted sample  additional confirmatory testing may be necessary for epidemiological  and / or clinical management purposes  to differentiate between  SARS-CoV-2 and other Sarbecovirus currently known to infect humans.  If clinically indicated additional testing with an alternate test  methodology [phone]) is advised. The SARS-CoV-2 RNA is generally  detectable in upper and lower respiratory sp ecimens during the acute  phase of infection. The expected result is Negative. Fact Sheet for Patients:  StrictlyIdeas.no Fact Sheet for Healthcare Providers: BankingDealers.co.za This test is not yet approved or cleared by the [place] FDA and has been authorized for detection and/or diagnosis of SARS-CoV-2 by FDA under an Emergency Use Authorization (EUA).  This EUA will remain in effect (meaning this test can be used) for the duration of the COVID-19 declaration under Section 564(b)(1) of the Act, 21 U.S.C. section 360bbb-3(b)(1), unless the authorization is terminated or revoked sooner. Performed at [facility], [place]  [address], [place], [place] [address]      Radiological Exams on Admission: Dg Chest 2 View  Result Date: 02/20/2019 CLINICAL DATA:  Status post fall, syncope EXAM: CHEST - 2 VIEW COMPARISON:  12/31/2018 FINDINGS: The heart size and mediastinal contours are within normal limits. Both lungs are clear. The visualized skeletal structures are unremarkable. IMPRESSION: No active cardiopulmonary disease. Electronically Signed   By: Kathreen Devoid   On: 02/20/2019 13:44   Ct Head Wo Contrast  Result Date: 02/20/2019 CLINICAL DATA:  Syncope with 10 minute period of unresponsiveness EXAM: CT HEAD WITHOUT CONTRAST TECHNIQUE: Contiguous axial images were obtained from the base of the skull through the vertex without intravenous contrast. COMPARISON:  February 13, 2019. FINDINGS: Brain: Mild diffuse atrophy remains stable. There is no intracranial mass, hemorrhage, extra-axial fluid collection, or midline shift. Patchy small vessel disease in the centra semiovale bilaterally is stable. No acute infarct is demonstrable on this study. Vascular: There is no hyperdense vessel. There is calcification in each carotid siphon region. Skull: The bony calvarium appears intact. Sinuses/Orbits: There is mucosal thickening in several ethmoid air cells. Other visualized paranasal sinuses are clear. Orbits appear symmetric bilaterally except for previous cataract removal on the right. Other: Mastoid air cells are clear. IMPRESSION: Stable atrophy with periventricular small vessel disease. No acute infarct evident. No mass or hemorrhage. There are foci of arterial vascular calcification. Mucosal thickening in several ethmoid air cells noted. Electronically Signed   By: Lowella Grip III M.D.   On: 02/20/2019 13:42    EKG: Independently reviewed.  Junctional rhythm 49 bpm Assessment/Plan Episodic  altered awareness: Acute.  Patient presents from home after having episode of unresponsiveness for approximately 10 minutes.  Heart rate in  the 40s.  TSH within normal limits on 6/15.  CT scan of the brain negative for any acute abnormalities. Question of symptoms related with dehydration or hepatic encephalopathy. -Admit to a medical telemetry bed -Neurochecks -Check orthostatic vital signs  -Check ammonia level -Follow-up telemetry overnight   Diabetes mellitus type 2: Uncontrolled.  Blood glucose 321 on admission.  Hemoglobin A1c noted to be 13.6 on 12/30/2018.  Patient on 70/30 insulin at home. -Hypoglycemic protocol -Continue 70/30 insulin 8 units twice daily -CBGs q. before meals and  with moderate SSI  Acute kidney injury superimposed on chronic kidney disease stage III: Baseline creatinine thought to be around 1.31.  Patient presents with creatinine elevated up to 1.81.  Suspect secondary to uncontrolled diabetes. -Normal saline IV fluids at 100 mL/h overnight -Recheck BMP in a.m.  Normocytic anemia: Hemoglobin 11.8 on admission which appears near patient's baseline -Continue to monitor.  Dysphagia: Patient previously recommended dysphagia 3 diet during last hospitalization. -Aspiration precautions -Dysphasia diet  Essential hypertension: Blood pressures currently appears stable. -Continue hydralazine, lisinopril, isosorbide mononitrate, and diltiazem   DVT prophylaxis: Lovenox Code Status: Full Family Communication: Unable to reach family over the phone Disposition Plan: Possible discharge home in a.m. Consults called: None Admission status: Observation  Norval Morton MD [facility] Pager [phone]   If 7PM-7AM, please contact night-coverage [email or web address] Password [facility]  02/20/2019, 6:11 PM

## 2019-02-20 NOTE — ED Triage Notes (Signed)
Per EMS: pt at home with wife and family, felt weak, family sat pt in a chair, at that time pt had a full syncopal episode. Family reports pt unresponsive for 10 minutes.  Pt denies any pain. A/O x4.

## 2019-02-20 NOTE — ED Notes (Signed)
Checked patient blood sugar it was 307 notified RN Cindy of blood sugar patient is resting with call bell in reach

## 2019-02-20 NOTE — ED Notes (Signed)
Patient in Xray at this time, Went to get blood.

## 2019-02-20 NOTE — ED Notes (Signed)
Spoke with patient's son over the phone. Given update that patient had just arrived to the hospital, testing in progress. Will give update once more information available.

## 2019-02-20 NOTE — ED Notes (Signed)
ED TO INPATIENT HANDOFF REPORT  ED Nurse Name and Phone #: [phone]  S Name/Age/Gender Eric Wade 78 y.o. male Room/Bed: 029C/029C  Code Status   Code Status: Prior  Home/SNF/Other Home Patient oriented to: self, place, time and situation Is this baseline? Yes   Triage Complete: Triage complete  Chief Complaint syncope  Triage Note Per EMS: pt at home with wife and family, felt weak, family sat pt in a chair, at that time pt had a full syncopal episode. Family reports pt unresponsive for 10 minutes.  Pt denies any pain. A/O x4.    Allergies Allergies  Allergen Reactions  . Nsaids     REACTION: renal insufficiency    Level of Care/Admitting Diagnosis ED Disposition    ED Disposition Condition [facility] Area: [facility] [100100]  Level of Care: Telemetry Medical [104]  I expect the patient will be discharged within 24 hours: No (not a candidate for 5C-Observation unit)  Covid Evaluation: Screening Protocol (No Symptoms)  Diagnosis: Neurology follow-up encounter [8469629]  Admitting Physician: Norval Morton [[id number]]  Attending Physician: Norval Morton [[id number]]  PT Class (Do Not Modify): Observation [104]  PT Acc Code (Do Not Modify): Observation [10022]       B Medical/Surgery History Past Medical History:  Diagnosis Date  . Allergic rhinitis 08/25/2016  . DIABETES MELLITUS, TYPE II 05/08/2007  . ERECTILE DYSFUNCTION 05/08/2007  . GOUT 07/01/2007  . HYPERLIPIDEMIA 05/08/2007  . HYPERTENSION 05/08/2007  . OBESITY 05/08/2007  . Preventative health care 12/28/2010  . Prostate cancer ([place]) 07/02/2008   Qualifier: Diagnosis of  By: Jenny Reichmann MD, Hunt Oris   . PSA, INCREASED 07/02/2008  . RENAL INSUFFICIENCY 07/01/2007  . Seasonal allergies 12/31/2018   History reviewed. No pertinent surgical history.   A IV Location/Drains/Wounds Patient Lines/Drains/Airways Status   Active Line/Drains/Airways    Name:   Placement date:    Placement time:   Site:   Days:   Peripheral IV 02/20/19 Left Forearm   02/20/19    1315    Forearm   less than 1          Intake/Output Last 24 hours No intake or output data in the 24 hours ending 02/20/19 1811  Labs/Imaging Results for orders placed or performed during the hospital encounter of 02/20/19 (from the past 48 hour(s))  CBG monitoring, ED     Status: Abnormal   Collection Time: 02/20/19  1:59 PM  Result Value Ref Range   Glucose-Capillary 307 (H) 70 - 99 mg/dL  CBC WITH DIFFERENTIAL     Status: Abnormal   Collection Time: 02/20/19  2:06 PM  Result Value Ref Range   WBC 9.4 4.0 - 10.5 K/uL   RBC 3.98 (L) 4.22 - 5.81 MIL/uL   Hemoglobin 11.8 (L) 13.0 - 17.0 g/dL   HCT 35.8 (L) 39.0 - 52.0 %   MCV 89.9 80.0 - 100.0 fL   MCH 29.6 26.0 - 34.0 pg   MCHC 33.0 30.0 - 36.0 g/dL   RDW 13.2 11.5 - 15.5 %   Platelets 271 150 - 400 K/uL   nRBC 0.0 0.0 - 0.2 %   Neutrophils Relative % 74 %   Neutro Abs 7.0 1.7 - 7.7 K/uL   Lymphocytes Relative 16 %   Lymphs Abs 1.5 0.7 - 4.0 K/uL   Monocytes Relative 7 %   Monocytes Absolute 0.7 0.1 - 1.0 K/uL   Eosinophils Relative 1 %   Eosinophils  Absolute 0.1 0.0 - 0.5 K/uL   Basophils Relative 1 %   Basophils Absolute 0.1 0.0 - 0.1 K/uL   Immature Granulocytes 1 %   Abs Immature Granulocytes 0.06 0.00 - 0.07 K/uL    Comment: Performed at [facility] [address], [place], [place] [address]  Comprehensive metabolic panel     Status: Abnormal   Collection Time: 02/20/19  2:06 PM  Result Value Ref Range   Sodium 136 135 - 145 mmol/L   Potassium 3.6 3.5 - 5.1 mmol/L   Chloride 101 98 - 111 mmol/L   CO2 25 22 - 32 mmol/L   Glucose, Bld 321 (H) 70 - 99 mg/dL   BUN 16 8 - 23 mg/dL   Creatinine, Ser 1.81 (H) 0.61 - 1.24 mg/dL   Calcium 9.3 8.9 - 10.3 mg/dL   Total Protein 7.3 6.5 - 8.1 g/dL   Albumin 3.0 (L) 3.5 - 5.0 g/dL   AST 17 15 - 41 U/L   ALT 13 0 - 44 U/L   Alkaline Phosphatase 70 38 - 126 U/L   Total Bilirubin  1.0 0.3 - 1.2 mg/dL   GFR calc non Af Amer 35 (L) >60 mL/min   GFR calc Af Amer 40 (L) >60 mL/min   Anion gap 10 5 - 15    Comment: Performed at [facility] [address] [place], [place] [address]  Ammonia     Status: None   Collection Time: 02/20/19  2:06 PM  Result Value Ref Range   Ammonia 26 9 - 35 umol/L    Comment: Performed at [facility], [place] [address] [place], [place] [address]  SARS Coronavirus 2 (CEPHEID - Performed in [facility] hospital lab), Hosp Order     Status: None   Collection Time: 02/20/19  2:25 PM   Specimen: Nasopharyngeal Swab  Result Value Ref Range   SARS Coronavirus 2 NEGATIVE NEGATIVE    Comment: (NOTE) If result is NEGATIVE SARS-CoV-2 target nucleic acids are NOT DETECTED. The SARS-CoV-2 RNA is generally detectable in upper and lower  respiratory specimens during the acute phase of infection. The lowest  concentration of SARS-CoV-2 viral copies this assay can detect is 250  copies / mL. A negative result does not preclude SARS-CoV-2 infection  and should not be used as the sole basis for treatment or other  patient management decisions.  A negative result may occur with  improper specimen collection / handling, submission of specimen other  than nasopharyngeal swab, presence of viral mutation(s) within the  areas targeted by this assay, and inadequate number of viral copies  (<250 copies / mL). A negative result must be combined with clinical  observations, patient history, and epidemiological information. If result is POSITIVE SARS-CoV-2 target nucleic acids are DETECTED. The SARS-CoV-2 RNA is generally detectable in upper and lower  respiratory specimens dur ing the acute phase of infection.  Positive  results are indicative of active infection with SARS-CoV-2.  Clinical  correlation with patient history and other diagnostic information is  necessary to determine patient infection status.  Positive results do  not rule out  bacterial infection or co-infection with other viruses. If result is PRESUMPTIVE POSTIVE SARS-CoV-2 nucleic acids MAY BE PRESENT.   A presumptive positive result was obtained on the submitted specimen  and confirmed on repeat testing.  While 2019 novel coronavirus  (SARS-CoV-2) nucleic acids may be present in the submitted sample  additional confirmatory testing may be  necessary for epidemiological  and / or clinical management purposes  to differentiate between  SARS-CoV-2 and other Sarbecovirus currently known to infect humans.  If clinically indicated additional testing with an alternate test  methodology [phone]) is advised. The SARS-CoV-2 RNA is generally  detectable in upper and lower respiratory sp ecimens during the acute  phase of infection. The expected result is Negative. Fact Sheet for Patients:  StrictlyIdeas.no Fact Sheet for Healthcare Providers: BankingDealers.co.za This test is not yet approved or cleared by the [place] FDA and has been authorized for detection and/or diagnosis of SARS-CoV-2 by FDA under an Emergency Use Authorization (EUA).  This EUA will remain in effect (meaning this test can be used) for the duration of the COVID-19 declaration under Section 564(b)(1) of the Act, 21 U.S.C. section 360bbb-3(b)(1), unless the authorization is terminated or revoked sooner. Performed at [facility], [place] [address], [place], [place] [address]    Dg Chest 2 View  Result Date: 02/20/2019 CLINICAL DATA:  Status post fall, syncope EXAM: CHEST - 2 VIEW COMPARISON:  12/31/2018 FINDINGS: The heart size and mediastinal contours are within normal limits. Both lungs are clear. The visualized skeletal structures are unremarkable. IMPRESSION: No active cardiopulmonary disease. Electronically Signed   By: Kathreen Devoid   On: 02/20/2019 13:44   Ct Head Wo Contrast  Result Date: 02/20/2019 CLINICAL DATA:  Syncope with 10  minute period of unresponsiveness EXAM: CT HEAD WITHOUT CONTRAST TECHNIQUE: Contiguous axial images were obtained from the base of the skull through the vertex without intravenous contrast. COMPARISON:  February 13, 2019. FINDINGS: Brain: Mild diffuse atrophy remains stable. There is no intracranial mass, hemorrhage, extra-axial fluid collection, or midline shift. Patchy small vessel disease in the centra semiovale bilaterally is stable. No acute infarct is demonstrable on this study. Vascular: There is no hyperdense vessel. There is calcification in each carotid siphon region. Skull: The bony calvarium appears intact. Sinuses/Orbits: There is mucosal thickening in several ethmoid air cells. Other visualized paranasal sinuses are clear. Orbits appear symmetric bilaterally except for previous cataract removal on the right. Other: Mastoid air cells are clear. IMPRESSION: Stable atrophy with periventricular small vessel disease. No acute infarct evident. No mass or hemorrhage. There are foci of arterial vascular calcification. Mucosal thickening in several ethmoid air cells noted. Electronically Signed   By: Lowella Grip III M.D.   On: 02/20/2019 13:42    Pending Labs Unresulted Labs (From admission, onward)   None      Vitals/Pain Today's Vitals   02/20/19 1708 02/20/19 1715 02/20/19 1730 02/20/19 1745  BP:  (!) 153/64 (!) 150/67 (!) 147/68  Pulse:  (!) 55 (!) 57 (!) 56  Resp:  12 18 20   Temp:      TempSrc:      SpO2:  97% 96% 96%  Height:      PainSc: 0-No pain       Isolation Precautions No active isolations  Medications Medications  sodium chloride 0.9 % bolus 1,000 mL (1,000 mLs Intravenous New Bag/Given 02/20/19 1445)    Mobility walks High fall risk(due to syncopal episode)   Focused Assessments    R Recommendations: See Admitting Provider Note  Report given to:   Additional Notes:

## 2019-02-20 NOTE — ED Notes (Signed)
Called son's # [phone] for updates. And phone given to pt.

## 2019-02-21 DIAGNOSIS — Z9841 Cataract extraction status, right eye: Secondary | ICD-10-CM | POA: Diagnosis not present

## 2019-02-21 DIAGNOSIS — I129 Hypertensive chronic kidney disease with stage 1 through stage 4 chronic kidney disease, or unspecified chronic kidney disease: Secondary | ICD-10-CM | POA: Diagnosis present

## 2019-02-21 DIAGNOSIS — R404 Transient alteration of awareness: Secondary | ICD-10-CM | POA: Diagnosis not present

## 2019-02-21 DIAGNOSIS — Z20828 Contact with and (suspected) exposure to other viral communicable diseases: Secondary | ICD-10-CM | POA: Diagnosis present

## 2019-02-21 DIAGNOSIS — I10 Essential (primary) hypertension: Secondary | ICD-10-CM | POA: Diagnosis not present

## 2019-02-21 DIAGNOSIS — Z9111 Patient's noncompliance with dietary regimen: Secondary | ICD-10-CM | POA: Diagnosis not present

## 2019-02-21 DIAGNOSIS — E11621 Type 2 diabetes mellitus with foot ulcer: Secondary | ICD-10-CM | POA: Diagnosis present

## 2019-02-21 DIAGNOSIS — K760 Fatty (change of) liver, not elsewhere classified: Secondary | ICD-10-CM | POA: Diagnosis present

## 2019-02-21 DIAGNOSIS — Z8673 Personal history of transient ischemic attack (TIA), and cerebral infarction without residual deficits: Secondary | ICD-10-CM | POA: Diagnosis not present

## 2019-02-21 DIAGNOSIS — Z794 Long term (current) use of insulin: Secondary | ICD-10-CM | POA: Diagnosis not present

## 2019-02-21 DIAGNOSIS — Z79899 Other long term (current) drug therapy: Secondary | ICD-10-CM | POA: Diagnosis not present

## 2019-02-21 DIAGNOSIS — N179 Acute kidney failure, unspecified: Secondary | ICD-10-CM | POA: Diagnosis present

## 2019-02-21 DIAGNOSIS — E11649 Type 2 diabetes mellitus with hypoglycemia without coma: Secondary | ICD-10-CM | POA: Diagnosis present

## 2019-02-21 DIAGNOSIS — N183 Chronic kidney disease, stage 3 (moderate): Secondary | ICD-10-CM | POA: Diagnosis present

## 2019-02-21 DIAGNOSIS — E1122 Type 2 diabetes mellitus with diabetic chronic kidney disease: Secondary | ICD-10-CM | POA: Diagnosis present

## 2019-02-21 DIAGNOSIS — R55 Syncope and collapse: Secondary | ICD-10-CM | POA: Diagnosis present

## 2019-02-21 DIAGNOSIS — L97309 Non-pressure chronic ulcer of unspecified ankle with unspecified severity: Secondary | ICD-10-CM | POA: Diagnosis present

## 2019-02-21 DIAGNOSIS — E11622 Type 2 diabetes mellitus with other skin ulcer: Secondary | ICD-10-CM | POA: Diagnosis present

## 2019-02-21 DIAGNOSIS — R1310 Dysphagia, unspecified: Secondary | ICD-10-CM | POA: Diagnosis present

## 2019-02-21 DIAGNOSIS — L97519 Non-pressure chronic ulcer of other part of right foot with unspecified severity: Secondary | ICD-10-CM | POA: Diagnosis present

## 2019-02-21 DIAGNOSIS — E119 Type 2 diabetes mellitus without complications: Secondary | ICD-10-CM | POA: Diagnosis not present

## 2019-02-21 DIAGNOSIS — E785 Hyperlipidemia, unspecified: Secondary | ICD-10-CM | POA: Diagnosis present

## 2019-02-21 DIAGNOSIS — R4182 Altered mental status, unspecified: Secondary | ICD-10-CM | POA: Diagnosis present

## 2019-02-21 DIAGNOSIS — M109 Gout, unspecified: Secondary | ICD-10-CM | POA: Diagnosis present

## 2019-02-21 DIAGNOSIS — D649 Anemia, unspecified: Secondary | ICD-10-CM | POA: Diagnosis present

## 2019-02-21 DIAGNOSIS — R413 Other amnesia: Secondary | ICD-10-CM | POA: Diagnosis present

## 2019-02-21 DIAGNOSIS — Z87891 Personal history of nicotine dependence: Secondary | ICD-10-CM | POA: Diagnosis not present

## 2019-02-21 DIAGNOSIS — Z7982 Long term (current) use of aspirin: Secondary | ICD-10-CM | POA: Diagnosis not present

## 2019-02-21 DIAGNOSIS — E1165 Type 2 diabetes mellitus with hyperglycemia: Secondary | ICD-10-CM | POA: Diagnosis present

## 2019-02-21 LAB — GLUCOSE, CAPILLARY
Glucose-Capillary: 318 mg/dL — ABNORMAL HIGH (ref 70–99)
Glucose-Capillary: 158 mg/dL — ABNORMAL HIGH (ref 70–99)
Glucose-Capillary: 180 mg/dL — ABNORMAL HIGH (ref 70–99)
Glucose-Capillary: 57 mg/dL — ABNORMAL LOW (ref 70–99)
Glucose-Capillary: 197 mg/dL — ABNORMAL HIGH (ref 70–99)

## 2019-02-21 LAB — CBC
HCT: 32.8 % — ABNORMAL LOW (ref 39.0–52.0)
Hemoglobin: 10.9 g/dL — ABNORMAL LOW (ref 13.0–17.0)
MCH: 29.2 pg (ref 26.0–34.0)
MCHC: 33.2 g/dL (ref 30.0–36.0)
MCV: 87.9 fL (ref 80.0–100.0)
Platelets: 248 10*3/uL (ref 150–400)
RBC: 3.73 MIL/uL — ABNORMAL LOW (ref 4.22–5.81)
RDW: 13.1 % (ref 11.5–15.5)
WBC: 6.8 10*3/uL (ref 4.0–10.5)
nRBC: 0 % (ref 0.0–0.2)

## 2019-02-21 LAB — URINALYSIS, ROUTINE W REFLEX MICROSCOPIC
Bilirubin Urine: NEGATIVE
Glucose, UA: 50 mg/dL — AB
Ketones, ur: NEGATIVE mg/dL
Nitrite: NEGATIVE
Protein, ur: 30 mg/dL — AB
Specific Gravity, Urine: 1.012 (ref 1.005–1.030)
WBC, UA: >50 WBC/hpf — ABNORMAL HIGH (ref 0–5)
pH: 6 (ref 5.0–8.0)

## 2019-02-21 LAB — BASIC METABOLIC PANEL
Anion gap: 8 (ref 5–15)
BUN: 17 mg/dL (ref 8–23)
CO2: 23 mmol/L (ref 22–32)
Calcium: 8.8 mg/dL — ABNORMAL LOW (ref 8.9–10.3)
Chloride: 106 mmol/L (ref 98–111)
Creatinine, Ser: 1.5 mg/dL — ABNORMAL HIGH (ref 0.61–1.24)
GFR calc Af Amer: 50 mL/min — ABNORMAL LOW (ref >60)
GFR calc non Af Amer: 43 mL/min — ABNORMAL LOW (ref >60)
Glucose, Bld: 287 mg/dL — ABNORMAL HIGH (ref 70–99)
Potassium: 3.5 mmol/L (ref 3.5–5.1)
Sodium: 137 mmol/L (ref 135–145)

## 2019-02-21 MED ORDER — INSULIN ASPART 100 UNIT/ML ~~LOC~~ SOLN: 0.0000 [IU] | Freq: Three times a day (TID) | SUBCUTANEOUS | Status: DC

## 2019-02-21 MED ORDER — INSULIN ASPART 100 UNIT/ML ~~LOC~~ SOLN: 0.0000 [IU] | Freq: Every day | SUBCUTANEOUS | Status: DC

## 2019-02-21 MED ORDER — INSULIN ASPART 100 UNIT/ML ~~LOC~~ SOLN
0.0000 [IU] | Freq: Every day | SUBCUTANEOUS | Status: DC
  Administered 2019-02-22: 23:00:00 2 [IU] via SUBCUTANEOUS

## 2019-02-21 NOTE — Care Management Obs Status (Signed)
[facility] NOTIFICATION   Patient Details  Name: BROWNIE NEHME MRN: [id number] Date of Birth: [birth year]   Medicare Observation Status Notification Given:  Yes    Pollie Friar, RN 02/21/2019, 4:46 PM

## 2019-02-21 NOTE — Evaluation (Signed)
Physical Therapy Treatment Patient Details Name: Eric Wade MRN: [id number] DOB: March 13, 1941 Today's Date: 02/21/2019    History of Present Illness Patient is an 78 year old male who came to the hospital following two episodes of AMS witnessed by his family. He came to after about 10 minutes. PMH: CVA i on 01/02/2019 HYN DMII.     PT Comments    Patient appears to be at his baseline level of functioning. He was able to transfer and ambulate without a loss of balance. He did not use an assistive device. He will be going back home with his family. Patient has no need for skilled therapy at this time.    Follow Up Recommendations  No PT follow up     Equipment Recommendations  Rolling walker with 5" wheels    Recommendations for Other Services       Precautions / Restrictions Precautions Precautions: Fall Restrictions Weight Bearing Restrictions: No    Mobility  Bed Mobility Overal bed mobility: Independent             General bed mobility comments: Patient sat up at the edge of the bed without assist. No syncope noted   Transfers Overall transfer level: Needs assistance   Transfers: Sit to/from Stand Sit to Stand: Supervision         General transfer comment: supervision for intial standing balance but once standing indepednent   Ambulation/Gait Ambulation/Gait assistance: Supervision Gait Distance (Feet): 50 Feet Assistive device: None Gait Pattern/deviations: WFL(Within Functional Limits) Gait velocity: decreased   General Gait Details: Patient able to ambualte int he room with just supervision. No syncope or dyspnea noted.    Stairs             Wheelchair Mobility    Modified Rankin (Stroke Patients Only) Modified Rankin (Stroke Patients Only) Pre-Morbid Rankin Score: Slight disability Modified Rankin: Slight disability     Balance Overall balance assessment: Needs assistance Sitting-balance support: No upper extremity supported;Feet  supported Sitting balance-Leahy Scale: Good     Standing balance support: No upper extremity supported;During functional activity Standing balance-Leahy Scale: Fair                              Cognition Arousal/Alertness: Awake/alert Behavior During Therapy: WFL for tasks assessed/performed Overall Cognitive Status: Within Functional Limits for tasks assessed                                        Exercises      General Comments        Pertinent Vitals/Pain Pain Assessment: No/denies pain    Home Living Family/patient expects to be discharged to:: Private residence Living Arrangements: Children Available Help at Discharge: Family Type of Home: House Home Access: Stairs to enter Entrance Stairs-Rails: Left Home Layout: One level Home Equipment: Shower seat      Prior Function Level of Independence: Independent      Comments: Patient has equipment but has not had to use it   PT Goals (current goals can now be found in the care plan section) Acute Rehab PT Goals Patient Stated Goal: to go home PT Goal Formulation: With patient Time For Goal Achievement: 01/16/19 Potential to Achieve Goals: Fair    Frequency           PT Plan      Co-evaluation  AM-PAC PT "6 Clicks" Mobility   Outcome Measure  Help needed turning from your back to your side while in a flat bed without using bedrails?: None Help needed moving from lying on your back to sitting on the side of a flat bed without using bedrails?: None Help needed moving to and from a bed to a chair (including a wheelchair)?: None Help needed standing up from a chair using your arms (e.g., wheelchair or bedside chair)?: A Little Help needed to walk in hospital room?: A Little Help needed climbing 3-5 steps with a railing? : A Little 6 Click Score: 21    End of Session Equipment Utilized During Treatment: Gait belt Activity Tolerance: Patient tolerated  treatment well Patient left: in bed;with call bell/phone within reach;with nursing/sitter in room Nurse Communication: Mobility status PT Visit Diagnosis: Other abnormalities of gait and mobility (R26.89)     Time: 9833-8250 PT Time Calculation (min) (ACUTE ONLY): 15 min  Charges:                          Carney Living PT DPT  02/21/2019, 1:13 PM

## 2019-02-21 NOTE — Consult Note (Signed)
[facility] Nurse wound consult note Patient receiving care in [facility] [phone]. Reason for Consult: Wound to right second toe Wound type: Diabetic foot ulcer Pressure Injury POA: Yes Measurement: 1.8 cm x 1.6 cm Wound bed: pink Drainage (amount, consistency, odor) no drainage noted. Patient states the toe wound has been present for about 2 weeks.  The toe is edematous and the tissue between toes 1, 2, and 3 is moist, macerated, and has an odor when the toes are separated. Periwound: Intact Dressing procedure/placement/frequency: Gently cleanse between great toe, second toe, and third toe on right foot with soap and water. Then dry thoroughly. Place a strip of Aquacel Ag Kellie Simmering [phone]) cut to the proper length and width, between these toes and over the top of the wound on the right second toe.  Wrap in [place]. Change daily. Monitor the wound area(s) for worsening of condition such as: Signs/symptoms of infection,  Increase in size,  Development of or worsening of odor, Development of pain, or increased pain at the affected locations.  Notify the medical team if any of these develop.  Thank you for the consult.  Discussed plan of care with the patient. [facility] nurse will not follow at this time.  Please re-consult the [facility] team if needed.  Val Riles, RN, MSN, CWOCN, CNS-BC, pager [phone]

## 2019-02-21 NOTE — Progress Notes (Signed)
Arrived to 3w34 at 2020 from Ed. Alert and oriented. Denies any pain. Call light within reach.

## 2019-02-21 NOTE — Evaluation (Signed)
Occupational Therapy Evaluation Patient Details Name: Eric Wade MRN: [id number] DOB: 1940/12/18 Today's Date: 02/21/2019    History of Present Illness Patient is an 78 year old male s/p two episodes of AMS witnessed by his family after 10 mins of syncopal episodes. PMH: CVA on 01/02/2019 HTN DMII. R great toe bandaged.    Clinical Impression   Pt PTA: Living with family and was independent. Pt currently ambulating in room with no AD with supervision to modified independence; transfers with supervisionA and stood for grooming at sink x5 mins. Pt performing balance challenges in standing with minguardA. No pain reported on R great toe, but bandaged. Pt with no focal deficits. Pt modified independent for ADL. No further OT needs required. OT signing off.    Follow Up Recommendations  No OT follow up;Supervision/Assistance - 24 hour    Equipment Recommendations  None recommended by OT    Recommendations for Other Services       Precautions / Restrictions Precautions Precautions: Fall Restrictions Weight Bearing Restrictions: No      Mobility Bed Mobility Overal bed mobility: Independent Bed Mobility: Supine to Sit     Supine to sit: Modified independent (Device/Increase time)     General bed mobility comments: sitting EOB with no physical assist  Transfers Overall transfer level: Needs assistance   Transfers: Sit to/from Stand Sit to Stand: Supervision         General transfer comment: supervision for intial standing balance but once standing independent     Balance Overall balance assessment: Needs assistance Sitting-balance support: No upper extremity supported;Feet supported Sitting balance-Leahy Scale: Good     Standing balance support: No upper extremity supported;During functional activity Standing balance-Leahy Scale: Fair                             ADL either performed or assessed with clinical judgement   ADL Overall ADL's : At  baseline                                       General ADL Comments: set-upA for UB/LB ADL- pt stood at sink with no physical assist required set-upA to modified independence     Vision Baseline Vision/History: Wears glasses Wears Glasses: At all times Vision Assessment?: No apparent visual deficits     Perception     Praxis      Pertinent Vitals/Pain Pain Assessment: No/denies pain     Hand Dominance Right   Extremity/Trunk Assessment Upper Extremity Assessment Upper Extremity Assessment: Overall WFL for tasks assessed   Lower Extremity Assessment Lower Extremity Assessment: Overall WFL for tasks assessed   Cervical / Trunk Assessment Cervical / Trunk Assessment: Normal   Communication Communication Communication: No difficulties   Cognition Arousal/Alertness: Awake/alert Behavior During Therapy: WFL for tasks assessed/performed Overall Cognitive Status: Within Functional Limits for tasks assessed                                     General Comments  Pt feels that he is back to baseline.    Exercises     Shoulder Instructions      Home Living Family/patient expects to be discharged to:: Private residence Living Arrangements: Children Available Help at Discharge: Family Type of Home: House Home Access: Stairs to  enter Entrance Stairs-Number of Steps: 3 Entrance Stairs-Rails: Left Home Layout: One level     Bathroom Shower/Tub: Tub/shower unit;Walk-in Psychologist, prison and probation services: Standard     Home Equipment: Shower seat          Prior Functioning/Environment Level of Independence: Independent        Comments: Patient has equipment but has not had to use it        OT Problem List:        OT Treatment/Interventions:      OT Goals(Current goals can be found in the care plan section) Acute Rehab OT Goals Patient Stated Goal: to go home  OT Frequency:     Barriers to D/C:            Co-evaluation               AM-PAC OT "6 Clicks" Daily Activity     Outcome Measure Help from another person eating meals?: None Help from another person taking care of personal grooming?: None Help from another person toileting, which includes using toliet, bedpan, or urinal?: None Help from another person bathing (including washing, rinsing, drying)?: A Little Help from another person to put on and taking off regular upper body clothing?: None Help from another person to put on and taking off regular lower body clothing?: None 6 Click Score: 23   End of Session Equipment Utilized During Treatment: Gait belt Nurse Communication: Mobility status  Activity Tolerance: Patient tolerated treatment well Patient left: in chair;with call bell/phone within reach;with chair alarm set  OT Visit Diagnosis: Unsteadiness on feet (R26.81);Other abnormalities of gait and mobility (R26.89);Other symptoms and signs involving cognitive function                Time: 1245-8099 OT Time Calculation (min): 15 min Charges:  OT General Charges $OT Visit: 1 Visit OT Evaluation $OT Eval Moderate Complexity: 1 Mod  Darryl Nestle) Marsa Aris OTR/L Acute Rehabilitation Services Pager: [phone] Office: [phone]   Audie Pinto 02/21/2019, 4:28 PM

## 2019-02-21 NOTE — Progress Notes (Signed)
SLP Cancellation Note  Patient Details Name: Eric Wade MRN: [id number] DOB: 1940-12-16   Cancelled treatment:       Reason Eval/Treat Not Completed: SLP screened, no needs identified, will sign off   Elvina Sidle, M.S., CCC-SLP 02/21/2019, 12:59 PM

## 2019-02-21 NOTE — TOC Initial Note (Signed)
Transition of Care [facility]) - Initial/Assessment Note    Patient Details  Name: Eric Wade MRN: [id number] Date of Birth: 22-Jun-1941  Transition of Care [facility]) CM/SW Contact:    Pollie Friar, RN Phone Number: 02/21/2019, 4:56 PM  Clinical Narrative:                 No f/u per PT. TOC following to see if [facility] RN needs to be continued for dressing changes to foot.   Expected Discharge Plan: [place] Barriers to Discharge: Continued Medical Work up   Patient Goals and CMS Choice        Expected Discharge Plan and Services Expected Discharge Plan: [place]   Discharge Planning Services: CM Consult   Living arrangements for the past 2 months: Single Family Home                                      Prior Living Arrangements/Services Living arrangements for the past 2 months: Single Family Home Lives with:: Adult Children(daughter) Patient language and need for interpreter reviewed:: Yes(no needs) Do you feel safe going back to the place where you live?: Yes      Need for Family Participation in Patient Care: Yes (Comment) Care giver support system in place?: Yes (comment)(has 24 hour supervision at daughters home.) Current home services: DME, Home PT, Home RN(3 in 1 and walker) Criminal Activity/Legal Involvement Pertinent to Current Situation/Hospitalization: No - Comment as needed  Activities of Daily Living Home Assistive Devices/Equipment: None ADL Screening (condition at time of admission) Patient's cognitive ability adequate to safely complete daily activities?: Yes Is the patient deaf or have difficulty hearing?: No Does the patient have difficulty seeing, even when wearing glasses/contacts?: No Does the patient have difficulty concentrating, remembering, or making decisions?: No Patient able to express need for assistance with ADLs?: Yes Does the patient have difficulty dressing or bathing?: No Independently  performs ADLs?: Yes (appropriate for developmental age) Does the patient have difficulty walking or climbing stairs?: No Weakness of Legs: None Weakness of Arms/Hands: None  Permission Sought/Granted                  Emotional Assessment Appearance:: Appears stated age Attitude/Demeanor/Rapport: Engaged Affect (typically observed): Accepting, Pleasant Orientation: : Oriented to Self, Oriented to Place, Oriented to  Time, Oriented to Situation   Psych Involvement: No (comment)  Admission diagnosis:  Syncope, unspecified syncope type [R55] Patient Active Problem List   Diagnosis Date Noted  . Diabetic ulcer of ankle ([place]) 02/21/2019  . AMS (altered mental status) 02/21/2019  . Neurology follow-up encounter 02/20/2019  . Episodic altered awareness 02/20/2019  . Acute kidney injury superimposed on chronic kidney disease ([place]) 02/20/2019  . Normocytic anemia 02/20/2019  . Increased ammonia level 01/10/2019  . Hypokalemia   . Hypophosphatemia   . Acute metabolic encephalopathy   . Encephalopathy acute   . Stroke ([place]) 12/28/2018  . Allergic rhinitis 08/25/2016  . Preventative health care 12/28/2010  . Prostate cancer ([place]) 07/02/2008  . GOUT 07/01/2007  . CKD (chronic kidney disease) stage 3, GFR 30-59 ml/min (HCC) 07/01/2007  . Insulin dependent diabetes mellitus ([place]) 05/08/2007  . Hyperlipidemia 05/08/2007  . OBESITY 05/08/2007  . ERECTILE DYSFUNCTION 05/08/2007  . Essential hypertension 05/08/2007   PCP:  [name], MD Pharmacy:   [place], [place] -  [place] [address] Phone: [phone] Fax: [phone]  [id number] - [place], [place] [address] [place] [address] Phone: [phone] Fax: [phone]     Social Determinants of Health (SDOH) Interventions    Readmission Risk Interventions No flowsheet  data found.

## 2019-02-21 NOTE — Progress Notes (Signed)
[facility] PROGRESS NOTE  Eric Wade:[id number] DOB: 80/12/1940 DOA: 02/20/2019 PCP: [name], MD  Assessment/Plan: Episodic  altered awareness: Acute.  Patient presented from home after having episode of unresponsiveness for approximately 10 minutes.  Heart rate in the 40s.  TSH within normal limits on 6/15.  CT scan of the brain negative for any acute abnormalities. No events on tele. Ammonia level within limits of normal. ?ability to administer meds properly.  -awaiting orthostatic vital signs  Diabetes mellitus type 2: Uncontrolled.  Blood glucose 321 on admission.  Hemoglobin A1c noted to be 13.6 on 12/30/2018. ?   Patient on 70/30 insulin at home. Of note HgA1c went from 7 to 13 in 15 months. Spoke with daughter who verified he has been giving himself injections BID as prescribed. Also admits that he drinking fruit punch with apple cider.  -Continue 70/30 insulin 8 units twice daily -CBGs q. before meals and with moderate SSI  Acute kidney injury superimposed on chronic kidney disease stage III: Baseline creatinine thought to be around 1.31.  Patient presented with creatinine elevated up to 1.81.  Suspect secondary to uncontrolled diabetes. Trending down to 1.5 today.  - received. Normal saline IV fluids at 100 mL/h overnight -Recheck BMP in a.m.  Normocytic anemia: Hemoglobin 11.8 on admission which appears near patient's baseline -Continue to monitor.  Dysphagia: Patient previously recommended dysphagia 3 diet during last hospitalization. -Aspiration precautions -Dysphasia diet  Essential hypertension: Blood pressures currently appears stable. -Continue hydralazine, lisinopril, isosorbide mononitrate, and diltiazem  Foot ulcer: top of second toe right foot. Evaluated by wound who recommended daily dressing changes. No odor. No fever.     Code Status: full Family Communication: sandra daughter on phone Disposition Plan: home hopefully  tomorrow   Consultants:    Procedures:    Antibiotics:    HPI/Subjective: 78 yo admitted with unresponsive episode. Etiology unclear. suspicous for uncontrolled diabetes leading to dehydration, hypotension, acute on chronic kidney disease. Improving after IV fluids and improved control   Objective: Vitals:   02/21/19 0916 02/21/19 1154  BP: (!) 159/64 (!) 158/66  Pulse: (!) 51 (!) 58  Resp: 17 16  Temp: 98.3 F (36.8 C) 98.2 F (36.8 C)  SpO2: 96% 99%    Intake/Output Summary (Last 24 hours) at 02/21/2019 1317 Last data filed at 02/21/2019 1157 Gross per 24 hour  Intake 1153.33 ml  Output 525 ml  Net 628.33 ml   Filed Weights   02/20/19 2021  Weight: 81.2 kg    Exam:   General:  Awake alert no acute distress  Cardiovascular: rrr no mgr no LE edema  Respiratory: normal effort BS clear bilaterally no wheeze  Abdomen: non-distended non-tender +BS no guarding or rebounding  Musculoskeletal: joints without swelling/erythema   Data Reviewed: Basic Metabolic Panel: Recent Labs  Lab 02/20/19 1406 02/21/19 0607  NA 136 137  K 3.6 3.5  CL 101 106  CO2 25 23  GLUCOSE 321* 287*  BUN 16 17  CREATININE 1.81* 1.50*  CALCIUM 9.3 8.8*   Liver Function Tests: Recent Labs  Lab 02/20/19 1406  AST 17  ALT 13  ALKPHOS 70  BILITOT 1.0  PROT 7.3  ALBUMIN 3.0*   No results for input(s): LIPASE, AMYLASE in the last 168 hours. Recent Labs  Lab 02/20/19 1406 02/20/19 2220  AMMONIA 26 30   CBC: Recent Labs  Lab 02/20/19 1406 02/21/19 0607  WBC 9.4 6.8  NEUTROABS 7.0  --   HGB 11.8* 10.9*  HCT 35.8* 32.8*  MCV 89.9 87.9  PLT 271 248   Cardiac Enzymes: No results for input(s): CKTOTAL, CKMB, CKMBINDEX, TROPONINI in the last 168 hours. BNP (last 3 results) Recent Labs    02/13/19 1811  BNP 154.5*    ProBNP (last 3 results) No results for input(s): PROBNP in the last 8760 hours.  CBG: Recent Labs  Lab 02/20/19 1359 02/20/19 2116  02/21/19 0611 02/21/19 1151  GLUCAP 307* 330* 318* 158*    Recent Results (from the past 240 hour(s))  SARS Coronavirus 2 (CEPHEID - Performed in [place] hospital lab), Hosp Order     Status: None   Collection Time: 02/20/19  2:25 PM   Specimen: Nasopharyngeal Swab  Result Value Ref Range Status   SARS Coronavirus 2 NEGATIVE NEGATIVE Final    Comment: (NOTE) If result is NEGATIVE SARS-CoV-2 target nucleic acids are NOT DETECTED. The SARS-CoV-2 RNA is generally detectable in upper and lower  respiratory specimens during the acute phase of infection. The lowest  concentration of SARS-CoV-2 viral copies this assay can detect is 250  copies / mL. A negative result does not preclude SARS-CoV-2 infection  and should not be used as the sole basis for treatment or other  patient management decisions.  A negative result may occur with  improper specimen collection / handling, submission of specimen other  than nasopharyngeal swab, presence of viral mutation(s) within the  areas targeted by this assay, and inadequate number of viral copies  (<250 copies / mL). A negative result must be combined with clinical  observations, patient history, and epidemiological information. If result is POSITIVE SARS-CoV-2 target nucleic acids are DETECTED. The SARS-CoV-2 RNA is generally detectable in upper and lower  respiratory specimens dur ing the acute phase of infection.  Positive  results are indicative of active infection with SARS-CoV-2.  Clinical  correlation with patient history and other diagnostic information is  necessary to determine patient infection status.  Positive results do  not rule out bacterial infection or co-infection with other viruses. If result is PRESUMPTIVE POSTIVE SARS-CoV-2 nucleic acids MAY BE PRESENT.   A presumptive positive result was obtained on the submitted specimen  and confirmed on repeat testing.  While 2019 novel coronavirus  (SARS-CoV-2) nucleic acids may  be present in the submitted sample  additional confirmatory testing may be necessary for epidemiological  and / or clinical management purposes  to differentiate between  SARS-CoV-2 and other Sarbecovirus currently known to infect humans.  If clinically indicated additional testing with an alternate test  methodology [phone]) is advised. The SARS-CoV-2 RNA is generally  detectable in upper and lower respiratory sp ecimens during the acute  phase of infection. The expected result is Negative. Fact Sheet for Patients:  StrictlyIdeas.no Fact Sheet for Healthcare Providers: BankingDealers.co.za This test is not yet approved or cleared by the [place] FDA and has been authorized for detection and/or diagnosis of SARS-CoV-2 by FDA under an Emergency Use Authorization (EUA).  This EUA will remain in effect (meaning this test can be used) for the duration of the COVID-19 declaration under Section 564(b)(1) of the Act, 21 U.S.C. section 360bbb-3(b)(1), unless the authorization is terminated or revoked sooner. Performed at [facility], [place] [address], [place], [place] [address]      Studies: Dg Chest 2 View  Result Date: 02/20/2019 CLINICAL DATA:  Status post fall, syncope EXAM: CHEST - 2 VIEW COMPARISON:  12/31/2018 FINDINGS: The heart size and mediastinal contours are within normal  limits. Both lungs are clear. The visualized skeletal structures are unremarkable. IMPRESSION: No active cardiopulmonary disease. Electronically Signed   By: Kathreen Devoid   On: 02/20/2019 13:44   Ct Head Wo Contrast  Result Date: 02/20/2019 CLINICAL DATA:  Syncope with 10 minute period of unresponsiveness EXAM: CT HEAD WITHOUT CONTRAST TECHNIQUE: Contiguous axial images were obtained from the base of the skull through the vertex without intravenous contrast. COMPARISON:  February 13, 2019. FINDINGS: Brain: Mild diffuse atrophy remains stable. There is no  intracranial mass, hemorrhage, extra-axial fluid collection, or midline shift. Patchy small vessel disease in the centra semiovale bilaterally is stable. No acute infarct is demonstrable on this study. Vascular: There is no hyperdense vessel. There is calcification in each carotid siphon region. Skull: The bony calvarium appears intact. Sinuses/Orbits: There is mucosal thickening in several ethmoid air cells. Other visualized paranasal sinuses are clear. Orbits appear symmetric bilaterally except for previous cataract removal on the right. Other: Mastoid air cells are clear. IMPRESSION: Stable atrophy with periventricular small vessel disease. No acute infarct evident. No mass or hemorrhage. There are foci of arterial vascular calcification. Mucosal thickening in several ethmoid air cells noted. Electronically Signed   By: Lowella Grip III M.D.   On: 02/20/2019 13:42    Scheduled Meds: . aspirin EC  81 mg Oral Daily  . atorvastatin  20 mg Oral q1800  . diltiazem  240 mg Oral Daily  . heparin  5,000 Units Subcutaneous Q8H  . hydrALAZINE  25 mg Oral TID  . insulin aspart  0-15 Units Subcutaneous TID WC  . insulin aspart  0-5 Units Subcutaneous QHS  . insulin aspart protamine- aspart  8 Units Subcutaneous BID WC  . isosorbide mononitrate  30 mg Oral Daily  . lactulose  10 g Oral Daily  . lisinopril  20 mg Oral Daily  . sodium chloride flush  3 mL Intravenous Q12H  . tamsulosin  0.4 mg Oral QHS   Continuous Infusions: . sodium chloride 50 mL/hr at 02/21/19 1029    Principal Problem:   Episodic altered awareness Active Problems:   Insulin dependent diabetes mellitus (HCC)   Essential hypertension   Acute kidney injury superimposed on chronic kidney disease (HCC)   Diabetic ulcer of ankle (HCC)   Normocytic anemia    Time spent: 40 minutes    [facility] M NP [facility] P If 7PM-7AM, please contact night-coverage at [email or web address], password [facility] 02/21/2019, 1:17 PM  LOS:  0 days

## 2019-02-21 NOTE — Progress Notes (Signed)
Inpatient Diabetes Program Recommendations  AACE/ADA: New Consensus Statement on Inpatient Glycemic Control   Target Ranges:  Prepandial:   less than 140 mg/dL      Peak postprandial:   less than 180 mg/dL (1-2 hours)      Critically ill patients:  140 - 180 mg/dL  Results for Eric Wade, Eric Wade (MRN [id number]) as of 02/21/2019 07:27  Ref. Range 02/20/2019 13:59 02/20/2019 21:16 02/21/2019 06:11  Glucose-Capillary Latest Ref Range: 70 - 99 mg/dL 307 (H) 330 (H) 318 (H)   Results for Eric Wade, Eric Wade (MRN [id number]) as of 02/21/2019 07:27  Ref. Range 12/30/2018 02:17  Hemoglobin A1C Latest Ref Range: 4.8 - 5.6 % 13.6 (H)   Review of Glycemic Control  Diabetes history: DM2 Outpatient Diabetes medications: 70/30 8 units BID Current orders for Inpatient glycemic control: 70/30 8 units BID, Novolog 0-15 units TID with meals  Inpatient Diabetes Program Recommendations:   Insulin-Basal: Noted 70/30 8 units BID is ordered but patient has not received any 70/30 since being admitted but should receive 70/30 8 units this morning with breakfast.  Correction (SSI): Please consider adding Novolog 0-5 units QHS for bedtime correction.  HgbA1C: A1C 13.6% on 12/30/18. Diabetes Coordintor spoke with patient's daughter on 01/02/19 regarding A1C and insulin during patient's last hospitalization.  Diet: If appropriate, please consider adding Carb Modified to Dys 2 diet.  NOTE: Noted consult for evaluation and recommendations. Chart reviewed. Patient was an inpatient from 12/28/18 to 01/03/19. During last hospital admission, inpatient diabetes coordinator spoke with patient's daughter over the phone regarding A1C and insulin since patient was discharged home with his daughter. Will follow along while inpatient and make further recommendations if needed.  Thanks, Barnie Alderman, RN, MSN, CDE Diabetes Coordinator Inpatient Diabetes Program [phone] (Team Pager from 8am to 5pm)

## 2019-02-21 NOTE — Progress Notes (Signed)
Ortho Vitals   Lying:  BP: 143/67  Pulse: 57  Sitting BP:140/68   Pulse: 61  Standing BP: 128/87  Pulse: 64

## 2019-02-21 NOTE — Progress Notes (Signed)
Nursing order states to call provider if pt's BP is above 160. Pt's BP in am was 159/64. Gave am dose of BP meds. At noon BP was 158/66. Paged Provider. NP stated that BP is ok.

## 2019-02-21 NOTE — Clinical Social Work Note (Signed)
CSW acknowledges SNF consult. PT recommending no follow up.  Dayton Scrape, [place]

## 2019-02-22 ENCOUNTER — Ambulatory Visit: Payer: Medicare HMO | Admitting: Internal Medicine

## 2019-02-22 DIAGNOSIS — L97309 Non-pressure chronic ulcer of unspecified ankle with unspecified severity: Secondary | ICD-10-CM

## 2019-02-22 DIAGNOSIS — E11649 Type 2 diabetes mellitus with hypoglycemia without coma: Secondary | ICD-10-CM

## 2019-02-22 DIAGNOSIS — I10 Essential (primary) hypertension: Secondary | ICD-10-CM

## 2019-02-22 DIAGNOSIS — N189 Chronic kidney disease, unspecified: Secondary | ICD-10-CM

## 2019-02-22 DIAGNOSIS — N179 Acute kidney failure, unspecified: Secondary | ICD-10-CM

## 2019-02-22 DIAGNOSIS — D649 Anemia, unspecified: Secondary | ICD-10-CM

## 2019-02-22 DIAGNOSIS — E119 Type 2 diabetes mellitus without complications: Secondary | ICD-10-CM

## 2019-02-22 DIAGNOSIS — Z794 Long term (current) use of insulin: Secondary | ICD-10-CM

## 2019-02-22 DIAGNOSIS — E11622 Type 2 diabetes mellitus with other skin ulcer: Secondary | ICD-10-CM

## 2019-02-22 DIAGNOSIS — R404 Transient alteration of awareness: Principal | ICD-10-CM

## 2019-02-22 LAB — GLUCOSE, CAPILLARY
Glucose-Capillary: 263 mg/dL — ABNORMAL HIGH (ref 70–99)
Glucose-Capillary: 245 mg/dL — ABNORMAL HIGH (ref 70–99)
Glucose-Capillary: 71 mg/dL (ref 70–99)
Glucose-Capillary: 222 mg/dL — ABNORMAL HIGH (ref 70–99)

## 2019-02-22 LAB — BASIC METABOLIC PANEL
Anion gap: 9 (ref 5–15)
BUN: 18 mg/dL (ref 8–23)
CO2: 22 mmol/L (ref 22–32)
Calcium: 8.7 mg/dL — ABNORMAL LOW (ref 8.9–10.3)
Chloride: 106 mmol/L (ref 98–111)
Creatinine, Ser: 1.59 mg/dL — ABNORMAL HIGH (ref 0.61–1.24)
GFR calc Af Amer: 47 mL/min — ABNORMAL LOW (ref >60)
GFR calc non Af Amer: 40 mL/min — ABNORMAL LOW (ref >60)
Glucose, Bld: 309 mg/dL — ABNORMAL HIGH (ref 70–99)
Potassium: 3.5 mmol/L (ref 3.5–5.1)
Sodium: 137 mmol/L (ref 135–145)

## 2019-02-22 MED ORDER — INSULIN NPH (HUMAN) (ISOPHANE) 100 UNIT/ML ~~LOC~~ SUSP
6.0000 [IU] | Freq: Every day | SUBCUTANEOUS | Status: DC
  Administered 2019-02-22: 6 [IU] via SUBCUTANEOUS
  Filled 2019-02-22: qty 10

## 2019-02-22 MED ORDER — INSULIN NPH (HUMAN) (ISOPHANE) 100 UNIT/ML ~~LOC~~ SUSP
12.0000 [IU] | Freq: Every day | SUBCUTANEOUS | Status: DC
  Administered 2019-02-23: 12 [IU] via SUBCUTANEOUS
  Filled 2019-02-22: qty 10

## 2019-02-22 MED ORDER — INSULIN GLARGINE 100 UNIT/ML ~~LOC~~ SOLN
10.0000 [IU] | Freq: Every day | SUBCUTANEOUS | Status: DC
  Administered 2019-02-22: 13:00:00 10 [IU] via SUBCUTANEOUS
  Filled 2019-02-22: qty 0.1

## 2019-02-22 MED ORDER — HYDRALAZINE HCL 50 MG PO TABS
50.0000 mg | ORAL_TABLET | Freq: Four times a day (QID) | ORAL | Status: DC
  Administered 2019-02-22 – 2019-02-23 (×5): 50 mg via ORAL
  Filled 2019-02-22 (×5): qty 1

## 2019-02-22 NOTE — Progress Notes (Signed)
Inpatient Diabetes Program Recommendations  AACE/ADA: New Consensus Statement on Inpatient Glycemic Control (2015)  Target Ranges:  Prepandial:   less than 140 mg/dL      Peak postprandial:   less than 180 mg/dL (1-2 hours)      Critically ill patients:  140 - 180 mg/dL   Lab Results  Component Value Date   GLUCAP 263 (H) 02/22/2019   HGBA1C 13.6 (H) 12/30/2018    Review of Glycemic Control Results for Eric Wade, Eric Wade (MRN [id number]) as of 02/22/2019 11:30  Ref. Range 02/21/2019 21:12 02/21/2019 23:16 02/22/2019 06:04  Glucose-Capillary Latest Ref Range: 70 - 99 mg/dL 57 (L) 197 (H) 263 (H)   Diabetes history: DM2 Outpatient Diabetes medications: 70/30 8 units BID Current orders for Inpatient glycemic control: 70/30 8 units BID, Novolog 0-15 units TID with meals, Lantus 10 units QD, Novolog 0-5 units QHS  Inpatient Diabetes Program Recommendations:   Noted hypoglycemia on 6/23 of 57 mg/dL following administration of Novolog 3 units per correction scale and 70/30 8 units.  Consider reducing Novolog 0-9 units TID.  Also, noted that Lantus was added this AM. Unusual to have both 70/30 and Lantus ordered, as this could place patient at risk of lows. Consider discontinuing Lantus 10 units QD. May could benefit from 70/30 increase once insulin needs are better identified with adjustments.   Thanks, Bronson Curb, MSN, RNC-OB Diabetes Coordinator [phone] (8a-5p)

## 2019-02-22 NOTE — Progress Notes (Signed)
PROGRESS NOTE  PEDRAM GOODCHILD QJJ:[id number] DOB: Jan 04, 1941 DOA: 02/20/2019 PCP: [name], MD  Brief History   Eric Wade is a 78 y.o. male here with episodic altered awareness.  Recently in the hospital with similar presentation that was attributed to poorly controlled DM.  Blood sugars are still high and patient not following a strict diabetic diet at home.  He came in with AKI.  Last hospitalization he had an EEG  which was negative for seizure. Does seem to have some memory impairment-- ? If this is interfering with his ability to give insulin injections?-- also ? If he is changing insulin injection site?  ? Need for outpatient LTM EEG?   Will continue IVF as close monitoring for possible seizures or other explanation for these recurrent episodes.  Has an ulcer on his right second toe but this does not appear grossly infected.  Continue wound care and monitor for healing.  Will change to inpatient  Consultants  . Wound care  Antibiotics Anti-infectives (From admission, onward)   None       Subjective  The patient is somnolent, but arousable. No new complaints.  Objective   Vitals:  Vitals:   02/22/19 0802 02/22/19 1253  BP: (!) 173/75 (!) 147/68  Pulse: (!) 59 67  Resp: 17 17  Temp: 98.1 F (36.7 C) 98 F (36.7 C)  SpO2: 96% 99%    Exam:  Constitutional:  . The patient is arousable, but somnolent and goes right back to sleep. Respiratory:  . No increased work of breathing. . No wheezes, rales, or rhonchi. . No tactile fremitus. Cardiovascular:  . Regular rate and rhythm . No murmurs, ectopy, or gallups are auscultated. . No lateral PMI. No thrills. Abdomen:  . Abdomen is soft, non-tender, non-distended. . No hernias, masses, or organomegaly are appreciated. . Normoactive bowel sounds. Musculoskeletal:  . No cyanosis, clubbing or edema. Skin:  . No rashes, lesions, ulcers . palpation of skin: no induration or nodules Neurologic:  . CN 2-12  intact . Sensation all 4 extremities intact; motor intact. Psychiatric:  Unable to evaluate due to patient somnolence.   I have personally reviewed the following:   Today's Data  . Vitals, Glucoses, BMP  Other Data  . UA with large leukocyte esterase. Not contaminated.  Scheduled Meds: . aspirin EC  81 mg Oral Daily  . atorvastatin  20 mg Oral q1800  . diltiazem  240 mg Oral Daily  . heparin  5,000 Units Subcutaneous Q8H  . hydrALAZINE  50 mg Oral Q6H  . insulin aspart  0-15 Units Subcutaneous TID WC  . insulin aspart  0-5 Units Subcutaneous QHS  . insulin glargine  10 Units Subcutaneous Daily  . isosorbide mononitrate  30 mg Oral Daily  . lactulose  10 g Oral Daily  . lisinopril  20 mg Oral Daily  . sodium chloride flush  3 mL Intravenous Q12H  . tamsulosin  0.4 mg Oral QHS   Continuous Infusions: . sodium chloride 50 mL/hr at 02/22/19 0442    Principal Problem:   Episodic altered awareness Active Problems:   Insulin dependent diabetes mellitus ([place])   Essential hypertension   Acute kidney injury superimposed on chronic kidney disease (HCC)   Normocytic anemia   Diabetic ulcer of ankle (HCC)   AMS (altered mental status)   LOS: 1 day    A & P  Episodicaltered awareness: Acute. Patient presented from home after having episode of unresponsiveness for approximately 10 minutes.  Heart rate in the 40s. TSH within normal limits on 6/15. CT scan of the brain negative for any acute abnormalities. No events on tele. Ammonia level within limits of normal. Given the patient's apparent cognitive deficits it is questionable as to whether or not the patient is able to administer meds properly. The patient is not orthostatic, but he has had a low blood glucose last night. Altered awareness may be due to low or even high blood glucoses.  Diabetes mellitus type 2: Uncontrolled. Blood glucose 321on admission.Hemoglobin A1c noted to be 13.6 on 12/30/2018. Patient on 70/30  insulin at home. Of note HgA1c went from 7 to 13 in 15 months. Spoke with daughter who verified he has been giving himself injections BID as prescribed. Also admits that he drinking fruit punch with apple cider. Pt has been receiving 70/30 insulin 8 units twice daily with CBGs q before meals and with moderate SSI. Glucoses have been high except for one episode of hypoglycemia on the evening of 02/21/2019. I have changed NPH to 12 units qam and 6 units q pm. Monitor. Possible the patient's episodes of mental status changes may be due to glucose issues.  Acute kidney injury superimposed on chronic kidney disease stage III: Baseline creatinine thought to be around 1.50. Lowest creatinine over the past 3 months has been 1.31. Most creatinine have been in the 1.5 range sometimes increasing to around 2.0. Patient presented with creatinine elevated up to 1.81. Suspect secondary to uncontrolled diabetes. Trending down to 1.59 today. [place]. Avoid nephrotoxic agents and hypotension. Stop IV fluids and monitor creatinine, electrolytes, and volume status.  Normocytic anemia: Hemoglobin 11.8 on admission which appears near patient's baseline 10.9 on 02/22/2019. Continue to monitor.  Dysphagia: Patient previously recommended dysphagia3diet during last hospitalization. This has been continued.   Essential hypertension: Blood pressures currently appears stable. Continue hydralazine, lisinopril, isosorbide mononitrate, and diltiazem.  Foot ulcer: top of second toe right foot. Evaluated by wound care nurse who recommended daily dressing changes. No odor. No fever.   I have seen and examined this patient myself. I have spent 35 minutes in his evaluation and care.  Code Status: full Family Communication: sandra daughter on phone Disposition Plan: Home  DVT prophylaxis: Heparin 5000 units sub q tid.  Kullen Tomasetti, DO [facility] Direct contact: see [email or web address]  7PM-7AM contact night coverage as  above 02/22/2019, 2:33 PM  LOS: 1 day

## 2019-02-23 LAB — CBC WITH DIFFERENTIAL/PLATELET
Abs Immature Granulocytes: 0.04 10*3/uL (ref 0.00–0.07)
Basophils Absolute: 0 10*3/uL (ref 0.0–0.1)
Basophils Relative: 1 %
Eosinophils Absolute: 0.1 10*3/uL (ref 0.0–0.5)
Eosinophils Relative: 2 %
HCT: 30.7 % — ABNORMAL LOW (ref 39.0–52.0)
Hemoglobin: 10.2 g/dL — ABNORMAL LOW (ref 13.0–17.0)
Immature Granulocytes: 1 %
Lymphocytes Relative: 24 %
Lymphs Abs: 1.9 10*3/uL (ref 0.7–4.0)
MCH: 29.4 pg (ref 26.0–34.0)
MCHC: 33.2 g/dL (ref 30.0–36.0)
MCV: 88.5 fL (ref 80.0–100.0)
Monocytes Absolute: 0.8 10*3/uL (ref 0.1–1.0)
Monocytes Relative: 10 %
Neutro Abs: 4.9 10*3/uL (ref 1.7–7.7)
Neutrophils Relative %: 62 %
Platelets: 233 10*3/uL (ref 150–400)
RBC: 3.47 MIL/uL — ABNORMAL LOW (ref 4.22–5.81)
RDW: 13.3 % (ref 11.5–15.5)
WBC: 7.7 10*3/uL (ref 4.0–10.5)
nRBC: 0 % (ref 0.0–0.2)

## 2019-02-23 LAB — BASIC METABOLIC PANEL
Anion gap: 7 (ref 5–15)
BUN: 11 mg/dL (ref 8–23)
CO2: 23 mmol/L (ref 22–32)
Calcium: 8.6 mg/dL — ABNORMAL LOW (ref 8.9–10.3)
Chloride: 109 mmol/L (ref 98–111)
Creatinine, Ser: 1.26 mg/dL — ABNORMAL HIGH (ref 0.61–1.24)
GFR calc Af Amer: >60 mL/min (ref >60)
GFR calc non Af Amer: 54 mL/min — ABNORMAL LOW (ref >60)
Glucose, Bld: 120 mg/dL — ABNORMAL HIGH (ref 70–99)
Potassium: 3 mmol/L — ABNORMAL LOW (ref 3.5–5.1)
Sodium: 139 mmol/L (ref 135–145)

## 2019-02-23 LAB — GLUCOSE, CAPILLARY
Glucose-Capillary: 107 mg/dL — ABNORMAL HIGH (ref 70–99)
Glucose-Capillary: 298 mg/dL — ABNORMAL HIGH (ref 70–99)
Glucose-Capillary: 304 mg/dL — ABNORMAL HIGH (ref 70–99)

## 2019-02-23 MED ORDER — HYDRALAZINE HCL 50 MG PO TABS: 75.0000 mg | ORAL_TABLET | Freq: Four times a day (QID) | ORAL | 0 refills | Status: DC

## 2019-02-23 MED ORDER — INSULIN NPH (HUMAN) (ISOPHANE) 100 UNIT/ML ~~LOC~~ SUSP: 12.0000 [IU] | Freq: Every day | SUBCUTANEOUS | 11 refills | Status: DC

## 2019-02-23 MED ORDER — INSULIN NPH (HUMAN) (ISOPHANE) 100 UNIT/ML ~~LOC~~ SUSP: 6.0000 [IU] | Freq: Every day | SUBCUTANEOUS | 11 refills | Status: DC

## 2019-02-23 MED ORDER — POTASSIUM CHLORIDE CRYS ER 20 MEQ PO TBCR
40.0000 meq | EXTENDED_RELEASE_TABLET | ORAL | Status: AC
  Administered 2019-02-23 (×2): 40 meq via ORAL
  Filled 2019-02-23 (×2): qty 2

## 2019-02-23 NOTE — TOC Transition Note (Signed)
Transition of Care [facility]) - CM/SW Discharge Note   Patient Details  Name: Eric Wade MRN: [id number] Date of Birth: 22-Jul-1941  Transition of Care [facility]) CM/SW Contact:  Pollie Friar, RN Phone Number: 02/23/2019, 11:19 AM   Clinical Narrative:    Pt discharging home with [facility] services. Tiffany with [facility] aware of d/c.  Pt has transportation home.   Final next level of care: [place] Barriers to Discharge: No Barriers Identified   Patient Goals and CMS Choice        Discharge Placement                       Discharge Plan and Services   Discharge Planning Services: CM Consult                      HH Arranged: RN, PT HH Agency: [facility] [employer] Date [place]: 02/23/19 Time [place]: 1115 Representative spoke with at [place]: [place] ([place]) Interventions     Readmission Risk Interventions No flowsheet data found.

## 2019-02-23 NOTE — Discharge Summary (Signed)
Physician Discharge Summary  Eric Wade EKB:[id number] DOB: 02-14-1941 DOA: 02/20/2019  PCP: [name], MD  Admit date: 02/20/2019 Discharge date: 02/23/2019  Recommendations for Outpatient Follow-up:  1. The patient is to follow up with home health at home for his wound care. 2. He is to follow up with his PCP in 7-10 days.   Discharge Diagnoses: Principal diagnosis is #1 1. Episodic altered awareness.  2. DM II Uncontrolled. 3. Hypoglycemia 4. Acute kidney injury 5. Normocytic anemia  Discharge Condition: Fair Disposition: Home with home health  Diet recommendation: Carbohydrate controlled diet.  Filed Weights   02/20/19 2021  Weight: 81.2 kg    History of present illness: Eric Wade is a 78 y.o. male with medical history significant of HTN, HLD, DM type II, obesity, dysphagia, and prostate cancer; who presented after having episode of unresponsiveness at home.  Patient's daughter reported that he had a glazed look over his face and was not responding to them for approximately 10 minutes.  They laid him down on the couch and he reportedly woke up.  The patient reports that he is had a dry cough and his heart is always beating fast.  At home family noted that the patient has not been wanting to eat, drink, or do much of anything at all.  He admits to urinating frequently and feeling weak all over.  Patient was just admitted into the hospital from 4/29-5/5  with strokelike symptoms and acute metabolic encephalopathy possibly related with uncontrolled diabetes.  During the hospital stay patient required intubation and was extubated on 5/1.  MRI was negative for any acute abnormalities. Ammonia levels were noted to be elevated up to 109, LFTs within normal limits, and mild fatty liver disease noted on abdominal ultrasound.  Patient's hemoglobin A1c of 13.6 and was started on 70/30 insulin.  He was also noted to have dysphagia for which he was placed on a dysphagia 3 diet  prior to discharge.  Skilled nursing facility was recommended, but patient declined this as he reported that the main rules.   Hospital Course:  Eric Wade a 78 y.o.malehere with episodic altered awareness. Recently in the hospital with similar presentation that was attributed to poorly controlled DM. Blood sugars are still high and patient not following a strict diabetic diet at home. He came in with AKI. Last hospitalization he had an EEG which was negative for seizure. Does seem to have some memory impairment-- ? If this is interfering with his ability to give insulin injections?-- also ? If he is changing insulin injection site? ? Need for outpatient LTM EEG? Will continue IVF as close monitoring for possible seizures or other explanation for these recurrent episodes. Has an ulcer on his right second toe but this does not appear grossly infected. Continue wound care and monitor for healing.   The patient is being discharged to home today with home health.  Today's assessment: S: The patient is resting comfortably. No new complaints. O: Vitals:  Vitals:   02/23/19 0807 02/23/19 1139  BP: (!) 156/62 (!) 149/63  Pulse: 63 67  Resp: 17 (!) 24  Temp: 98.7 F (37.1 C) 98.7 F (37.1 C)  SpO2: 97% 95%   Constitutional:   The patient is arousable, but somnolent and goes right back to sleep. Respiratory:   No increased work of breathing.  No wheezes, rales, or rhonchi.  No tactile fremitus. Cardiovascular:   Regular rate and rhythm  No murmurs, ectopy, or gallups are  auscultated.  No lateral PMI. No thrills. Abdomen:   Abdomen is soft, non-tender, non-distended.  No hernias, masses, or organomegaly are appreciated.  Normoactive bowel sounds. Musculoskeletal:   No cyanosis, clubbing or edema. Skin:   No rashes, lesions, ulcers  palpation of skin: no induration or nodules Neurologic:   CN 2-12 intact  Sensation all 4 extremities intact; motor  intact. Psychiatric:  Unable to evaluate due to patient somnolence.    Discharge Instructions  Discharge Instructions    Activity as tolerated - No restrictions   Complete by: As directed    Call MD for:  persistant nausea and vomiting   Complete by: As directed    Call MD for:  redness, tenderness, or signs of infection (pain, swelling, redness, odor or green/yellow discharge around incision site)   Complete by: As directed    Call MD for:  severe uncontrolled pain   Complete by: As directed    Call MD for:  temperature >100.4   Complete by: As directed    Diet - low sodium heart healthy   Complete by: As directed    Discharge wound care:   Complete by: As directed    Dressing procedure/placement/frequency: Gently cleanse between great toe, second toe, and third toe on right foot with soap and water. Then dry thoroughly. Place a strip of Aquacel Ag Kellie Simmering [phone]) cut to the proper length and width, between these toes and over the top of the wound on the right second toe.  Wrap in [place]. Change daily. Monitor the wound area(s) for worsening of condition such as: Signs/symptoms of infection,  Increase in size,  Development of or worsening of odor, Development of pain, or increased pain at the affected locations.  Notify the medical team if any of these develop.   Increase activity slowly   Complete by: As directed      Allergies as of 02/23/2019      Reactions   Nsaids    REACTION: renal insufficiency      Medication List    STOP taking these medications   insulin NPH-regular Human (70-30) 100 UNIT/ML injection     TAKE these medications   aspirin 81 MG EC tablet Take 81 mg by mouth daily.   atorvastatin 20 MG tablet Commonly known as: LIPITOR TAKE 1 TABLET (20 MG TOTAL) BY MOUTH DAILY AT 6 PM.   Auto-Lancet Misc Pt received from [place]   blood glucose meter kit and supplies Use four times daily as directed E11.9   blood glucose meter kit and supplies  Kit Pt receives from [employer]   blood glucose meter kit and supplies Kit Dispense based on patient and insurance preference. Use up to four times daily as directed. (FOR ICD-9 250.00, 250.01).   diltiazem 240 MG 24 hr capsule Commonly known as: Dilt-XR Take 1 capsule (240 mg total) by mouth daily.   hydrALAZINE 50 MG tablet Commonly known as: APRESOLINE Take 1.5 tablets (75 mg total) by mouth every 6 (six) hours. What changed:   medication strength  how much to take  when to take this   insulin NPH Human 100 UNIT/ML injection Commonly known as: NOVOLIN N Inject 0.06 mLs (6 Units total) into the skin at bedtime.   insulin NPH Human 100 UNIT/ML injection Commonly known as: NOVOLIN N Inject 0.12 mLs (12 Units total) into the skin daily before breakfast. Start taking on: February 24, 2019   isosorbide mononitrate 30 MG 24 hr tablet Commonly known as: IMDUR TAKE 1  TABLET BY MOUTH EVERY DAY   lactulose 10 GM/15ML solution Commonly known as: CHRONULAC Take 15 mLs (10 g total) by mouth daily.   Lancets Misc Use as directed four times per day E11.9   lisinopril 20 MG tablet Commonly known as: ZESTRIL Take 1 tablet (20 mg total) by mouth daily. Must keep May 12th appt for future refills   Resource ThickenUp Clear Powd For nectar thick liquid   tamsulosin 0.4 MG Caps capsule Commonly known as: FLOMAX Take 0.4 mg by mouth at bedtime.            Discharge Care Instructions  (From admission, onward)         Start     Ordered   02/23/19 0000  Discharge wound care:    Comments: Dressing procedure/placement/frequency: Gently cleanse between great toe, second toe, and third toe on right foot with soap and water. Then dry thoroughly. Place a strip of Aquacel Ag Kellie Simmering [phone]) cut to the proper length and width, between these toes and over the top of the wound on the right second toe.  Wrap in [place]. Change daily. Monitor the wound area(s) for worsening of condition such  as: Signs/symptoms of infection,  Increase in size,  Development of or worsening of odor, Development of pain, or increased pain at the affected locations.  Notify the medical team if any of these develop.   02/23/19 1056         Allergies  Allergen Reactions   Nsaids     REACTION: renal insufficiency    The results of significant diagnostics from this hospitalization (including imaging, microbiology, ancillary and laboratory) are listed below for reference.    Significant Diagnostic Studies: Dg Chest 2 View  Result Date: 02/20/2019 CLINICAL DATA:  Status post fall, syncope EXAM: CHEST - 2 VIEW COMPARISON:  12/31/2018 FINDINGS: The heart size and mediastinal contours are within normal limits. Both lungs are clear. The visualized skeletal structures are unremarkable. IMPRESSION: No active cardiopulmonary disease. Electronically Signed   By: Kathreen Devoid   On: 02/20/2019 13:44   Ct Head Wo Contrast  Result Date: 02/20/2019 CLINICAL DATA:  Syncope with 10 minute period of unresponsiveness EXAM: CT HEAD WITHOUT CONTRAST TECHNIQUE: Contiguous axial images were obtained from the base of the skull through the vertex without intravenous contrast. COMPARISON:  February 13, 2019. FINDINGS: Brain: Mild diffuse atrophy remains stable. There is no intracranial mass, hemorrhage, extra-axial fluid collection, or midline shift. Patchy small vessel disease in the centra semiovale bilaterally is stable. No acute infarct is demonstrable on this study. Vascular: There is no hyperdense vessel. There is calcification in each carotid siphon region. Skull: The bony calvarium appears intact. Sinuses/Orbits: There is mucosal thickening in several ethmoid air cells. Other visualized paranasal sinuses are clear. Orbits appear symmetric bilaterally except for previous cataract removal on the right. Other: Mastoid air cells are clear. IMPRESSION: Stable atrophy with periventricular small vessel disease. No acute infarct  evident. No mass or hemorrhage. There are foci of arterial vascular calcification. Mucosal thickening in several ethmoid air cells noted. Electronically Signed   By: Lowella Grip III M.D.   On: 02/20/2019 13:42   Ct Head Wo Contrast  Result Date: 02/13/2019 CLINICAL DATA:  Syncopal episode EXAM: CT HEAD WITHOUT CONTRAST TECHNIQUE: Contiguous axial images were obtained from the base of the skull through the vertex without intravenous contrast. COMPARISON:  12/29/2018 FINDINGS: Brain: Stable age related cerebral atrophy, ventriculomegaly and periventricular white matter disease. No extra-axial fluid collections are  identified. No CT findings for acute hemispheric infarction or intracranial hemorrhage. No mass lesions. The brainstem and cerebellum are normal. Vascular: Vascular calcifications but no definite aneurysm or hyperdense vessels. Skull: No bone lesions or fracture. Sinuses/Orbits: The paranasal sinuses and mastoid air cells are clear. The globes are intact. Previous cataract surgery on the right. Other: No scalp lesions or hematoma. IMPRESSION: 1. Stable age related cerebral atrophy, ventriculomegaly and periventricular white matter disease. 2. No acute intracranial findings or mass lesions. Electronically Signed   By: Marijo Sanes M.D.   On: 02/13/2019 18:56   Dg Carlena Hurl Op Medicare Speech Path  Result Date: 02/14/2019 Objective Swallowing Evaluation: Type of Study: MBS-Modified Barium Swallow Study  Patient Details Name: BRAELYN BORDONARO MRN: [id number] Date of Birth: 02/19/1941 Today's Date: 02/14/2019 Time: SLP Start Time (ACUTE ONLY): 1135 -SLP Stop Time (ACUTE ONLY): 1150 SLP Time Calculation (min) (ACUTE ONLY): 15 min Past Medical History: Past Medical History: Diagnosis Date  Allergic rhinitis 08/25/2016  DIABETES MELLITUS, TYPE II 05/08/2007  ERECTILE DYSFUNCTION 05/08/2007  GOUT 07/01/2007  HYPERLIPIDEMIA 05/08/2007  HYPERTENSION 05/08/2007  OBESITY 05/08/2007  Preventative health  care 12/28/2010  Prostate cancer ([place]) 07/02/2008  Qualifier: Diagnosis of  By: Jenny Reichmann MD, Hunt Oris   PSA, INCREASED 07/02/2008  RENAL INSUFFICIENCY 07/01/2007  Seasonal allergies 12/31/2018 Past Surgical History: No past surgical history on file. HPI: 78 year old male with prior history of renal insufficiency, hypertension, hyperlipidemia, and DM referred for OP MBS. Hospital admission 4/29-5/5 for CVA.  ETT 4/29-5/1. MBS 5/2 revealed mild oropharyngeal dysphagia with trace aspiration of thin liquids (audible).  Dys3/nectar recommended.  Subjective: alert, pleasant Assessment / Plan / Recommendation CHL IP CLINICAL IMPRESSIONS 02/14/2019 Clinical Impression Pt presents with normal oropharyngeal swallow - mastication is effective; there is swift transition of all POs through  pharynx and UES; reliable laryngeal vestibule closure; no penetration/aspiration.  13 mm barium pill passed though pharynx/esophagus easily.  Pt may resume regular consistency diet with thin liquids; meds whole with liquids.  No further dysphagia.   SLP Visit Diagnosis Dysphagia, unspecified (R13.10) Attention and concentration deficit following -- Frontal lobe and executive function deficit following -- Impact on safety and function No limitations   CHL IP TREATMENT RECOMMENDATION 02/14/2019 Treatment Recommendations No treatment recommended at this time   Prognosis 12/31/2018 Prognosis for Safe Diet Advancement Good Barriers to Reach Goals Time post onset Barriers/Prognosis Comment -- CHL IP DIET RECOMMENDATION 02/14/2019 SLP Diet Recommendations Regular solids;Thin liquid Liquid Administration via Straw;Cup Medication Administration Whole meds with liquid Compensations -- Postural Changes --   CHL IP OTHER RECOMMENDATIONS 02/14/2019 Recommended Consults -- Oral Care Recommendations Oral care BID Other Recommendations --   CHL IP FOLLOW UP RECOMMENDATIONS 02/14/2019 Follow up Recommendations None   CHL IP FREQUENCY AND DURATION 12/31/2018 Speech Therapy  Frequency (ACUTE ONLY) min 2x/week Treatment Duration 1 week      CHL IP ORAL PHASE 02/14/2019 Oral Phase WFL Oral - Pudding Teaspoon -- Oral - Pudding Cup -- Oral - Honey Teaspoon -- Oral - Honey Cup -- Oral - Nectar Teaspoon -- Oral - Nectar Cup -- Oral - Nectar Straw -- Oral - Thin Teaspoon -- Oral - Thin Cup -- Oral - Thin Straw -- Oral - Puree -- Oral - Mech Soft -- Oral - Regular -- Oral - Multi-Consistency -- Oral - Pill -- Oral Phase - Comment --  CHL IP PHARYNGEAL PHASE 02/14/2019 Pharyngeal Phase WFL Pharyngeal- Pudding Teaspoon -- Pharyngeal -- Pharyngeal- Pudding Cup -- Pharyngeal -- Pharyngeal-  Honey Teaspoon -- Pharyngeal -- Pharyngeal- Honey Cup -- Pharyngeal -- Pharyngeal- Nectar Teaspoon -- Pharyngeal -- Pharyngeal- Nectar Cup -- Pharyngeal -- Pharyngeal- Nectar Straw -- Pharyngeal -- Pharyngeal- Thin Teaspoon -- Pharyngeal -- Pharyngeal- Thin Cup -- Pharyngeal -- Pharyngeal- Thin Straw -- Pharyngeal -- Pharyngeal- Puree -- Pharyngeal -- Pharyngeal- Mechanical Soft -- Pharyngeal -- Pharyngeal- Regular -- Pharyngeal -- Pharyngeal- Multi-consistency -- Pharyngeal -- Pharyngeal- Pill -- Pharyngeal -- Pharyngeal Comment --  CHL IP CERVICAL ESOPHAGEAL PHASE 12/31/2018 Cervical Esophageal Phase WFL Pudding Teaspoon -- Pudding Cup -- Honey Teaspoon -- Honey Cup -- Nectar Teaspoon -- Nectar Cup -- Nectar Straw -- Thin Teaspoon -- Thin Cup -- Thin Straw -- Puree -- Mechanical Soft -- Regular -- Multi-consistency -- Pill -- Cervical Esophageal Comment -- Juan Quam Laurice 02/14/2019, 12:36 PM            CLINICAL DATA:  Dysphagia. Left-sided weakness. EXAM: MODIFIED BARIUM SWALLOW TECHNIQUE: Different consistencies of barium were administered orally to the patient by the Speech Pathologist. Imaging of the pharynx was performed in the lateral projection. The radiologist was present in the fluoroscopy room for this study, providing personal supervision. FLUOROSCOPY TIME:  Fluoroscopy Time:  42 seconds Radiation  Exposure Index (if provided by the fluoroscopic device): Number of Acquired Spot Images: 0 COMPARISON:  None. FINDINGS: Various consistencies barium were administered during lateral fluoroscopic evaluation of the pharynx. No laryngeal penetration or aspiration was demonstrated. There is only mild oral delay. IMPRESSION: No significant findings.  No aspiration identified. Please refer to the Speech Pathologists report for complete details and recommendations. Electronically Signed   By: Richardean Sale M.D.   On: 02/14/2019 12:31    Microbiology: Recent Results (from the past 240 hour(s))  SARS Coronavirus 2 (CEPHEID - Performed in [facility] hospital lab), Hosp Order     Status: None   Collection Time: 02/20/19  2:25 PM   Specimen: Nasopharyngeal Swab  Result Value Ref Range Status   SARS Coronavirus 2 NEGATIVE NEGATIVE Final    Comment: (NOTE) If result is NEGATIVE SARS-CoV-2 target nucleic acids are NOT DETECTED. The SARS-CoV-2 RNA is generally detectable in upper and lower  respiratory specimens during the acute phase of infection. The lowest  concentration of SARS-CoV-2 viral copies this assay can detect is 250  copies / mL. A negative result does not preclude SARS-CoV-2 infection  and should not be used as the sole basis for treatment or other  patient management decisions.  A negative result may occur with  improper specimen collection / handling, submission of specimen other  than nasopharyngeal swab, presence of viral mutation(s) within the  areas targeted by this assay, and inadequate number of viral copies  (<250 copies / mL). A negative result must be combined with clinical  observations, patient history, and epidemiological information. If result is POSITIVE SARS-CoV-2 target nucleic acids are DETECTED. The SARS-CoV-2 RNA is generally detectable in upper and lower  respiratory specimens dur ing the acute phase of infection.  Positive  results are indicative of active  infection with SARS-CoV-2.  Clinical  correlation with patient history and other diagnostic information is  necessary to determine patient infection status.  Positive results do  not rule out bacterial infection or co-infection with other viruses. If result is PRESUMPTIVE POSTIVE SARS-CoV-2 nucleic acids MAY BE PRESENT.   A presumptive positive result was obtained on the submitted specimen  and confirmed on repeat testing.  While 2019 novel coronavirus  (SARS-CoV-2) nucleic acids may be present in the  submitted sample  additional confirmatory testing may be necessary for epidemiological  and / or clinical management purposes  to differentiate between  SARS-CoV-2 and other Sarbecovirus currently known to infect humans.  If clinically indicated additional testing with an alternate test  methodology [phone]) is advised. The SARS-CoV-2 RNA is generally  detectable in upper and lower respiratory sp ecimens during the acute  phase of infection. The expected result is Negative. Fact Sheet for Patients:  StrictlyIdeas.no Fact Sheet for Healthcare Providers: BankingDealers.co.za This test is not yet approved or cleared by the [place] FDA and has been authorized for detection and/or diagnosis of SARS-CoV-2 by FDA under an Emergency Use Authorization (EUA).  This EUA will remain in effect (meaning this test can be used) for the duration of the COVID-19 declaration under Section 564(b)(1) of the Act, 21 U.S.C. section 360bbb-3(b)(1), unless the authorization is terminated or revoked sooner. Performed at [facility], [place] [address] [place], [place] [address]      Labs: Basic Metabolic Panel: Recent Labs  Lab 02/20/19 1406 02/21/19 0607 02/22/19 0437 02/23/19 0434  NA 136 137 137 139  K 3.6 3.5 3.5 3.0*  CL 101 106 106 109  CO2 '25 23 22 23  ' GLUCOSE 321* 287* 309* 120*  BUN '16 17 18 11  ' CREATININE 1.81* 1.50* 1.59* 1.26*    CALCIUM 9.3 8.8* 8.7* 8.6*   Liver Function Tests: Recent Labs  Lab 02/20/19 1406  AST 17  ALT 13  ALKPHOS 70  BILITOT 1.0  PROT 7.3  ALBUMIN 3.0*   No results for input(s): LIPASE, AMYLASE in the last 168 hours. Recent Labs  Lab 02/20/19 1406 02/20/19 2220  AMMONIA 26 30   CBC: Recent Labs  Lab 02/20/19 1406 02/21/19 0607 02/23/19 0434  WBC 9.4 6.8 7.7  NEUTROABS 7.0  --  4.9  HGB 11.8* 10.9* 10.2*  HCT 35.8* 32.8* 30.7*  MCV 89.9 87.9 88.5  PLT 271 248 233   Cardiac Enzymes: No results for input(s): CKTOTAL, CKMB, CKMBINDEX, TROPONINI in the last 168 hours. BNP: BNP (last 3 results) Recent Labs    02/13/19 1811  BNP 154.5*    ProBNP (last 3 results) No results for input(s): PROBNP in the last 8760 hours.  CBG: Recent Labs  Lab 02/22/19 1725 02/22/19 2124 02/23/19 0603 02/23/19 1305 02/23/19 1359  GLUCAP 71 222* 107* 298* 304*    Principal Problem:   Episodic altered awareness Active Problems:   Insulin dependent diabetes mellitus (HCC)   Essential hypertension   Acute kidney injury superimposed on chronic kidney disease (HCC)   Normocytic anemia   Diabetic ulcer of ankle (HCC)   AMS (altered mental status)   Time coordinating discharge: 38 minutes.  Signed:        Zawadi Aplin, DO [facility]  02/23/2019, 3:36 PM

## 2019-02-23 NOTE — Progress Notes (Signed)
Pt discharging to his home with home health services. Pt is alert and oriented. Pt has no new concerns. Pt verbalizes understanding discharge instructions with teach back. All pt belongings sent home with patient.

## 2019-02-23 NOTE — Plan of Care (Signed)
Pt met goals for this admission 

## 2019-02-24 ENCOUNTER — Telehealth: Payer: Self-pay | Admitting: *Deleted

## 2019-02-24 LAB — URINE CULTURE: Culture: >=100000 — AB

## 2019-02-24 NOTE — Telephone Encounter (Signed)
Daughter [name]) called back completed TCM below.Eric Wade  Transition Care Management Follow-up Telephone Call   Date discharged? 02/23/19   How have you been since you were released from the hospital? Per daughter she states he is doing ok   Do you understand why you were in the hospital? YES   Do you understand the discharge instructions? YES   Where were you discharged to? Home   Items Reviewed:  Medications reviewed: YES, daughter states they change his insulin. No longer taking 70/30. Currently taking Novolin twice a day. Med list was already updated with new insulin, and how he is taking his hydralazine  Allergies reviewed: YES  Dietary changes reviewed: YES, heart healthy and carb modified  Referrals reviewed: YES, daughter states home health called them on yesterday. Will come out this monday   Functional Questionnaire:   Activities of Daily Living (ADLs):   she states he are independent in the following: bathing and hygiene, feeding, continence, grooming and toileting States he require assistance with the following: ambulation and dressing   Any transportation issues/concerns?: NO   Any patient concerns? NO   Confirmed importance and date/time of follow-up visits scheduled YES, appt 03/02/19  Provider Appointment booked with Dr. Jenny Reichmann  Confirmed with patient if condition begins to worsen call PCP or go to the ER.  Patient was given the office number and encouraged to call back with question or concerns.  : YES

## 2019-02-24 NOTE — Telephone Encounter (Signed)
Tried calling pt several times line keep being busy. Also called daughter (sandra), but there was no answer and could not leave vm being vm is full. Will retry later...Eric Wade

## 2019-02-25 ENCOUNTER — Other Ambulatory Visit: Payer: Self-pay | Admitting: Internal Medicine

## 2019-03-02 ENCOUNTER — Ambulatory Visit (INDEPENDENT_AMBULATORY_CARE_PROVIDER_SITE_OTHER): Payer: Medicare HMO | Admitting: Internal Medicine

## 2019-03-02 ENCOUNTER — Encounter: Payer: Self-pay | Admitting: Internal Medicine

## 2019-03-02 ENCOUNTER — Other Ambulatory Visit (INDEPENDENT_AMBULATORY_CARE_PROVIDER_SITE_OTHER): Payer: Medicare HMO

## 2019-03-02 ENCOUNTER — Other Ambulatory Visit: Payer: Self-pay

## 2019-03-02 VITALS — BP 142/86 | HR 74 | Temp 98.3°F | Ht 74.0 in | Wt 189.0 lb

## 2019-03-02 DIAGNOSIS — I10 Essential (primary) hypertension: Secondary | ICD-10-CM

## 2019-03-02 DIAGNOSIS — N183 Chronic kidney disease, stage 3 unspecified: Secondary | ICD-10-CM

## 2019-03-02 DIAGNOSIS — E611 Iron deficiency: Secondary | ICD-10-CM

## 2019-03-02 DIAGNOSIS — E559 Vitamin D deficiency, unspecified: Secondary | ICD-10-CM

## 2019-03-02 DIAGNOSIS — E538 Deficiency of other specified B group vitamins: Secondary | ICD-10-CM

## 2019-03-02 DIAGNOSIS — N189 Chronic kidney disease, unspecified: Secondary | ICD-10-CM

## 2019-03-02 DIAGNOSIS — Z794 Long term (current) use of insulin: Secondary | ICD-10-CM

## 2019-03-02 DIAGNOSIS — IMO0001 Reserved for inherently not codable concepts without codable children: Secondary | ICD-10-CM

## 2019-03-02 DIAGNOSIS — E119 Type 2 diabetes mellitus without complications: Secondary | ICD-10-CM

## 2019-03-02 DIAGNOSIS — N179 Acute kidney failure, unspecified: Secondary | ICD-10-CM

## 2019-03-02 LAB — BASIC METABOLIC PANEL
BUN: 11 mg/dL (ref 6–23)
CO2: 24 mEq/L (ref 19–32)
Calcium: 9 mg/dL (ref 8.4–10.5)
Chloride: 106 mEq/L (ref 96–112)
Creatinine, Ser: 1.44 mg/dL (ref 0.40–1.50)
GFR: 57.05 mL/min — ABNORMAL LOW (ref >60.00)
Glucose, Bld: 142 mg/dL — ABNORMAL HIGH (ref 70–99)
Potassium: 3.8 mEq/L (ref 3.5–5.1)
Sodium: 139 mEq/L (ref 135–145)

## 2019-03-02 LAB — CBC WITH DIFFERENTIAL/PLATELET
Basophils Absolute: 0.1 10*3/uL (ref 0.0–0.1)
Basophils Relative: 1.2 % (ref 0.0–3.0)
Eosinophils Absolute: 0.1 10*3/uL (ref 0.0–0.7)
Eosinophils Relative: 1.4 % (ref 0.0–5.0)
HCT: 34.3 % — ABNORMAL LOW (ref 39.0–52.0)
Hemoglobin: 11.3 g/dL — ABNORMAL LOW (ref 13.0–17.0)
Lymphocytes Relative: 27.3 % (ref 12.0–46.0)
Lymphs Abs: 1.6 10*3/uL (ref 0.7–4.0)
MCHC: 33.1 g/dL (ref 30.0–36.0)
MCV: 91 fl (ref 78.0–100.0)
Monocytes Absolute: 0.7 10*3/uL (ref 0.1–1.0)
Monocytes Relative: 11.9 % (ref 3.0–12.0)
Neutro Abs: 3.5 10*3/uL (ref 1.4–7.7)
Neutrophils Relative %: 58.2 % (ref 43.0–77.0)
Platelets: 297 10*3/uL (ref 150.0–400.0)
RBC: 3.77 Mil/uL — ABNORMAL LOW (ref 4.22–5.81)
RDW: 14.8 % (ref 11.5–15.5)
WBC: 5.9 10*3/uL (ref 4.0–10.5)

## 2019-03-02 LAB — VITAMIN D 25 HYDROXY (VIT D DEFICIENCY, FRACTURES): VITD: 45.08 ng/mL (ref 30.00–100.00)

## 2019-03-02 LAB — HEPATIC FUNCTION PANEL
ALT: 11 U/L (ref 0–53)
AST: 14 U/L (ref 0–37)
Albumin: 3.7 g/dL (ref 3.5–5.2)
Alkaline Phosphatase: 58 U/L (ref 39–117)
Bilirubin, Direct: 0.1 mg/dL (ref 0.0–0.3)
Total Bilirubin: 0.5 mg/dL (ref 0.2–1.2)
Total Protein: 7.8 g/dL (ref 6.0–8.3)

## 2019-03-02 LAB — IBC PANEL
Iron: 36 ug/dL — ABNORMAL LOW (ref 42–165)
Saturation Ratios: 15 % — ABNORMAL LOW (ref 20.0–50.0)
Transferrin: 171 mg/dL — ABNORMAL LOW (ref 212.0–360.0)

## 2019-03-02 LAB — VITAMIN B12: Vitamin B-12: 605 pg/mL (ref 211–911)

## 2019-03-02 NOTE — Patient Instructions (Signed)
Please continue all other medications as before, and refills have been done if requested.  Please have the pharmacy call with any other refills you may need.  Please continue your efforts at being more active, low cholesterol diet, and weight control.  You are otherwise up to date with prevention measures today.  Please keep your appointments with your specialists as you may have planned  Please go to the LAB in the Basement (turn left off the elevator) for the tests to be done today  You will be contacted by phone if any changes need to be made immediately.  Otherwise, you will receive a letter about your results with an explanation, but please check with MyChart first.  Please remember to sign up for MyChart if you have not done so, as this will be important to you in the future with finding out test results, communicating by private email, and scheduling acute appointments online when needed.  Please return in 3 months, or sooner if needed 

## 2019-03-02 NOTE — Progress Notes (Signed)
° °Subjective:  ° ° Patient ID: Eric Wade, male    DOB: 05/11/1941, 78 y.o.   MRN: [id number] ° °HPI  78 y.o. male with medical history significant of HTN, HLD, DM type II, obesity, dysphagia, and prostate cancer; who presented tp ED after having episode of unresponsiveness at home.  Patient's daughter reported that he had a glazed look over his face and was not responding to them for approximately 10 minutes.  They laid him down on the couch and he reportedly woke up.  At home family noted that the patient has not been wanting to eat, drink, or do much of anything at all.  He admitted to urinating frequently and feeling weak all over.  Patient had been just admitted into the hospital from 4/29-5/5  with strokelike symptoms and acute metabolic encephalopathy possibly related with uncontrolled diabetes.  During the hospital stay patient required intubation and was extubated on 5/1.  MRI was negative for any acute abnormalities. Ammonia levels were noted to be elevated up to 109, LFTs within normal limits, and mild fatty liver disease noted on abdominal ultrasound.  Patient's hemoglobin A1c of 13.6 and was started on 70/30 insulin.  He was also noted to have dysphagia for which he was placed on a dysphagia 3 diet prior to discharge.  Skilled nursing facility was recommended, but patient declined.  Also has an ulcer on his right second toe but this does not appear grossly infected.  Advised to continue wound care and monitor for healing.  Was d/c with HH.  Here with family (daughter I believe) who is supportive and confirms his hx  - Pt denies chest pain, increased sob or doe, wheezing, orthopnea, PND, increased LE swelling, palpitations, dizziness or syncope.  Pt denies new neurological symptoms such as new headache, or facial or extremity weakness or numbness   Pt denies polydipsia, polyuria, Denies urinary symptoms such as dysuria, frequency, urgency, flank pain, hematuria or n/v, fever, chills.  Denies  worsening reflux, abd pain, dysphagia, n/v, bowel change or blood.   Pt denies fever, wt loss, night sweats  Since home states CBGs are in low 100s °Past Medical History:  °Diagnosis Date  °• Allergic rhinitis 08/25/2016  °• DIABETES MELLITUS, TYPE II 05/08/2007  °• ERECTILE DYSFUNCTION 05/08/2007  °• GOUT 07/01/2007  °• HYPERLIPIDEMIA 05/08/2007  °• HYPERTENSION 05/08/2007  °• OBESITY 05/08/2007  °• Preventative health care 12/28/2010  °• Prostate cancer (HCC) 07/02/2008  ° Qualifier: Diagnosis of  By:  MD,  W   °• PSA, INCREASED 07/02/2008  °• RENAL INSUFFICIENCY 07/01/2007  °• Seasonal allergies 12/31/2018  ° °No past surgical history on file. ° reports that he has quit smoking. He has never used smokeless tobacco. He reports that he does not drink alcohol or use drugs. °family history includes Hypertension in an other family member. °Allergies  °Allergen Reactions  °• Nsaids   °  REACTION: renal insufficiency  ° °Current Outpatient Medications on File Prior to Visit  °Medication Sig Dispense Refill  °• aspirin 81 MG EC tablet Take 81 mg by mouth daily.      °• atorvastatin (LIPITOR) 20 MG tablet TAKE 1 TABLET (20 MG TOTAL) BY MOUTH DAILY AT 6 PM. 90 tablet 0  °• blood glucose meter kit and supplies KIT Pt receives from [facility] 1 each 0  °• blood glucose meter kit and supplies KIT Dispense based on patient and insurance preference. Use up to four times daily as directed. (FOR ICD-9 250.00, 250.01). 1 each 0  °• blood glucose meter kit and supplies Use four times daily as directed E11.9 1   each 0  °• diltiazem (DILT-XR) 240 MG 24 hr capsule Take 1 capsule (240 mg total) by mouth daily. 90 capsule 1  °• hydrALAZINE (APRESOLINE) 50 MG tablet Take 1.5 tablets (75 mg total) by mouth every 6 (six) hours. 240 tablet 0  °• insulin NPH Human (NOVOLIN N) 100 UNIT/ML injection Inject 0.12 mLs (12 Units total) into the skin daily before breakfast. 10 mL 11  °• insulin NPH Human (NOVOLIN N) 100 UNIT/ML injection Inject 0.06 mLs  (6 Units total) into the skin at bedtime. 10 mL 11  °• isosorbide mononitrate (IMDUR) 30 MG 24 hr tablet TAKE 1 TABLET BY MOUTH EVERY DAY (Patient taking differently: Take 30 mg by mouth daily. ) 30 tablet 2  °• lactulose (CHRONULAC) 10 GM/15ML solution Take 15 mLs (10 g total) by mouth daily. 236 mL 0  °• Lancet Devices (AUTO-LANCET) MISC Pt received from [facility] 1 each 0  °• Lancets MISC Use as directed four times per day E11.9 100 each 11  °• lisinopril (ZESTRIL) 20 MG tablet Take 1 tablet (20 mg total) by mouth daily. Must keep May 12th appt for future refills 30 tablet 0  °• Maltodextrin-Xanthan Gum (RESOURCE THICKENUP CLEAR) POWD For nectar thick liquid 10 Can 0  °• tamsulosin (FLOMAX) 0.4 MG CAPS capsule Take 0.4 mg by mouth at bedtime.    ° °No current facility-administered medications on file prior to visit.   ° °Review of Systems ° Constitutional: Negative for other unusual diaphoresis or sweats °HENT: Negative for ear discharge or swelling °Eyes: Negative for other worsening visual disturbances °Respiratory: Negative for stridor or other swelling  °Gastrointestinal: Negative for worsening distension or other blood °Genitourinary: Negative for retention or other urinary change °Musculoskeletal: Negative for other MSK pain or swelling °Skin: Negative for color change or other new lesions °Neurological: Negative for worsening tremors and other numbness  °Psychiatric/Behavioral: Negative for worsening agitation or other fatigue °All other system neg per pt °   °Objective:  ° Physical Exam °BP (!) 142/86    Pulse 74    Temp 98.3 °F (36.8 °C) (Oral)    Ht 6' 2" (1.88 m)    Wt 189 lb (85.7 kg)    SpO2 96%    BMI 24.27 kg/m²  °VS noted,  °Constitutional: Pt appears in NAD °HENT: Head: NCAT.  °Right Ear: External ear normal.  °Left Ear: External ear normal.  °Eyes: . Pupils are equal, round, and reactive to light. Conjunctivae and EOM are normal °Nose: without d/c or deformity °Neck: Neck supple. Gross normal  ROM °Cardiovascular: Normal rate and regular rhythm.   °Pulmonary/Chest: Effort normal and breath sounds without rales or wheezing.  °Abd:  Soft, NT, ND, + BS, no organomegaly °Neurological: Pt is alert. At baseline orientation, motor grossly intact °Skin: Skin is warm. No rashes, other new lesions, no LE edema °Psychiatric: Pt behavior is normal without agitation  °No other exam findings °Lab Results  °Component Value Date  ° WBC 5.9 03/02/2019  ° HGB 11.3 (L) 03/02/2019  ° HCT 34.3 (L) 03/02/2019  ° PLT 297.0 03/02/2019  ° GLUCOSE 142 (H) 03/02/2019  ° CHOL 179 12/30/2018  ° TRIG 59 12/30/2018  ° HDL 66 12/30/2018  ° LDLDIRECT 72.5 12/30/2010  ° LDLCALC 101 (H) 12/30/2018  ° ALT 11 03/02/2019  ° AST 14 03/02/2019  ° NA 139 03/02/2019  ° K 3.8 03/02/2019  ° CL 106 03/02/2019  ° CREATININE 1.44 03/02/2019  ° BUN 11 03/02/2019  ° CO2 24   03/02/2019  ° TSH 2.214 02/13/2019  ° PSA 0.30 08/25/2016  ° INR 0.9 12/28/2018  ° HGBA1C 13.6 (H) 12/30/2018  ° MICROALBUR 2.6 (H) 03/11/2017  ° °   °Assessment & Plan:  ° °

## 2019-03-03 ENCOUNTER — Encounter: Payer: Self-pay | Admitting: Internal Medicine

## 2019-03-03 NOTE — Assessment & Plan Note (Signed)
For f/u BMP, suspect improved,  to f/u any worsening symptoms or concerns

## 2019-03-03 NOTE — Assessment & Plan Note (Signed)
stable overall by history and exam, recent data reviewed with pt, and pt to continue medical treatment as before,  to f/u any worsening symptoms or concerns  

## 2019-03-03 NOTE — Assessment & Plan Note (Signed)
Recently severe uncontrolled, to f/u endo, also refer DM education

## 2019-03-06 ENCOUNTER — Telehealth: Payer: Self-pay | Admitting: Internal Medicine

## 2019-03-06 DIAGNOSIS — R1310 Dysphagia, unspecified: Secondary | ICD-10-CM

## 2019-03-06 DIAGNOSIS — N183 Chronic kidney disease, stage 3 (moderate): Secondary | ICD-10-CM

## 2019-03-06 DIAGNOSIS — I69391 Dysphagia following cerebral infarction: Secondary | ICD-10-CM

## 2019-03-06 DIAGNOSIS — I129 Hypertensive chronic kidney disease with stage 1 through stage 4 chronic kidney disease, or unspecified chronic kidney disease: Secondary | ICD-10-CM | POA: Diagnosis not present

## 2019-03-06 DIAGNOSIS — E785 Hyperlipidemia, unspecified: Secondary | ICD-10-CM

## 2019-03-06 DIAGNOSIS — E1122 Type 2 diabetes mellitus with diabetic chronic kidney disease: Secondary | ICD-10-CM | POA: Diagnosis not present

## 2019-03-06 DIAGNOSIS — R55 Syncope and collapse: Secondary | ICD-10-CM

## 2019-03-06 DIAGNOSIS — E11621 Type 2 diabetes mellitus with foot ulcer: Secondary | ICD-10-CM | POA: Diagnosis not present

## 2019-03-06 DIAGNOSIS — Z8546 Personal history of malignant neoplasm of prostate: Secondary | ICD-10-CM

## 2019-03-06 DIAGNOSIS — Z794 Long term (current) use of insulin: Secondary | ICD-10-CM

## 2019-03-06 DIAGNOSIS — L97511 Non-pressure chronic ulcer of other part of right foot limited to breakdown of skin: Secondary | ICD-10-CM | POA: Diagnosis not present

## 2019-03-06 DIAGNOSIS — Z6823 Body mass index (BMI) 23.0-23.9, adult: Secondary | ICD-10-CM

## 2019-03-06 DIAGNOSIS — I69354 Hemiplegia and hemiparesis following cerebral infarction affecting left non-dominant side: Secondary | ICD-10-CM

## 2019-03-06 DIAGNOSIS — E669 Obesity, unspecified: Secondary | ICD-10-CM

## 2019-03-06 DIAGNOSIS — M109 Gout, unspecified: Secondary | ICD-10-CM

## 2019-03-06 NOTE — Telephone Encounter (Signed)
Verbal orders given via VM 

## 2019-03-06 NOTE — Telephone Encounter (Signed)
Caller/Agency: Emeline Gins at [facility] Number: [phone] [place] to leave verbal on VM Requesting OT/PT/Skilled Nursing/Social Work/Speech Therapy: Speech Therapy Frequency: 2 times a week for 4 weeks to address cognition.

## 2019-03-23 ENCOUNTER — Telehealth: Payer: Self-pay | Admitting: Internal Medicine

## 2019-03-23 ENCOUNTER — Other Ambulatory Visit: Payer: Self-pay | Admitting: Internal Medicine

## 2019-03-23 MED ORDER — ACCU-CHEK GUIDE VI STRP: ORAL_STRIP | 12 refills | Status: DC

## 2019-03-23 NOTE — Telephone Encounter (Signed)
Refill for the Accu Chek Guide Me test strips instead of the ACCU-CHEK AVIVA PLUS test strip.[name] the Guide Me strips are less expensive.     Pharmacy:  [id number] - [place], [place] [phone] (Phone) [phone] (Fax)

## 2019-03-23 NOTE — Telephone Encounter (Signed)
Corrected and sent to pharmacy

## 2019-03-28 ENCOUNTER — Other Ambulatory Visit: Payer: Self-pay | Admitting: Internal Medicine

## 2019-03-30 ENCOUNTER — Telehealth: Payer: Self-pay | Admitting: Internal Medicine

## 2019-03-30 NOTE — Telephone Encounter (Signed)
Copied from [place] [id number]. Topic: Quick Communication - Home Health Verbal Orders >> Mar 30, 2019  3:08 PM Ivar Drape wrote: Caller/Agency:  Junie Panning w/[facility] Number:  [phone] . She wanted to report a missed visit.  No answer at door or on phone.  It will be a non present discharge.

## 2019-03-30 NOTE — Telephone Encounter (Signed)
Noted  

## 2019-04-04 ENCOUNTER — Other Ambulatory Visit: Payer: Self-pay

## 2019-04-04 NOTE — Patient Outreach (Signed)
First attempt to obtain mRs. Patient's only number is invalid. Left message on daughter's home phone.

## 2019-04-11 ENCOUNTER — Ambulatory Visit: Payer: Medicare HMO | Admitting: Internal Medicine

## 2019-04-11 ENCOUNTER — Other Ambulatory Visit: Payer: Self-pay

## 2019-04-11 NOTE — Patient Outreach (Signed)
Telephone outreach to patient to obtain mRs was successfully completed. mRs= 2. Spoke with patient to obtain score.

## 2019-05-05 ENCOUNTER — Other Ambulatory Visit: Payer: Self-pay | Admitting: Internal Medicine

## 2019-06-02 ENCOUNTER — Ambulatory Visit: Payer: Medicare HMO | Admitting: Internal Medicine

## 2019-06-02 DIAGNOSIS — Z0289 Encounter for other administrative examinations: Secondary | ICD-10-CM

## 2019-06-05 ENCOUNTER — Other Ambulatory Visit: Payer: Self-pay | Admitting: Internal Medicine

## 2019-06-08 ENCOUNTER — Other Ambulatory Visit: Payer: Self-pay | Admitting: Internal Medicine

## 2019-06-18 ENCOUNTER — Other Ambulatory Visit: Payer: Self-pay | Admitting: Internal Medicine

## 2019-08-05 ENCOUNTER — Other Ambulatory Visit: Payer: Self-pay | Admitting: Internal Medicine

## 2019-10-07 ENCOUNTER — Other Ambulatory Visit: Payer: Self-pay | Admitting: Internal Medicine

## 2019-10-23 ENCOUNTER — Other Ambulatory Visit: Payer: Self-pay

## 2019-10-23 MED ORDER — HYDRALAZINE HCL 50 MG PO TABS: 75.0000 mg | ORAL_TABLET | Freq: Four times a day (QID) | ORAL | 1 refills | Status: DC

## 2019-10-23 MED ORDER — DILTIAZEM HCL ER 240 MG PO CP24: 240.0000 mg | ORAL_CAPSULE | Freq: Every day | ORAL | 1 refills | Status: DC

## 2019-10-23 NOTE — Telephone Encounter (Signed)
Please refill as per office routine med refill policy (all routine meds refilled for 3 mo or monthly per pt preference up to one year from last visit, then month to month grace period for 3 mo, then further med refills will have to be denied)  

## 2020-01-13 ENCOUNTER — Other Ambulatory Visit: Payer: Self-pay | Admitting: Internal Medicine

## 2020-01-13 NOTE — Telephone Encounter (Signed)
Please refill as per office routine med refill policy (all routine meds refilled for 3 mo or monthly per pt preference up to one year from last visit, then month to month grace period for 3 mo, then further med refills will have to be denied)  

## 2020-02-02 ENCOUNTER — Other Ambulatory Visit: Payer: Self-pay | Admitting: Internal Medicine

## 2020-02-29 ENCOUNTER — Other Ambulatory Visit: Payer: Self-pay | Admitting: Internal Medicine

## 2020-02-29 NOTE — Telephone Encounter (Signed)
Please refill as per office routine med refill policy (all routine meds refilled for 3 mo or monthly per pt preference up to one year from last visit, then month to month grace period for 3 mo, then further med refills will have to be denied)  

## 2020-03-11 ENCOUNTER — Emergency Department (HOSPITAL_COMMUNITY): Payer: Medicare HMO

## 2020-03-11 ENCOUNTER — Observation Stay (HOSPITAL_COMMUNITY)
Admission: EM | Admit: 2020-03-11 | Discharge: 2020-03-12 | Disposition: A | Discharge status: Home / Self Care | Payer: Medicare HMO | Attending: Emergency Medicine | Admitting: Emergency Medicine

## 2020-03-11 ENCOUNTER — Other Ambulatory Visit: Payer: Self-pay

## 2020-03-11 ENCOUNTER — Encounter (HOSPITAL_COMMUNITY): Payer: Self-pay

## 2020-03-11 DIAGNOSIS — E860 Dehydration: Secondary | ICD-10-CM | POA: Diagnosis not present

## 2020-03-11 DIAGNOSIS — Z7982 Long term (current) use of aspirin: Secondary | ICD-10-CM | POA: Diagnosis not present

## 2020-03-11 DIAGNOSIS — E1165 Type 2 diabetes mellitus with hyperglycemia: Principal | ICD-10-CM | POA: Insufficient documentation

## 2020-03-11 DIAGNOSIS — R9431 Abnormal electrocardiogram [ECG] [EKG]: Secondary | ICD-10-CM

## 2020-03-11 DIAGNOSIS — Z20822 Contact with and (suspected) exposure to covid-19: Secondary | ICD-10-CM | POA: Insufficient documentation

## 2020-03-11 DIAGNOSIS — Z87891 Personal history of nicotine dependence: Secondary | ICD-10-CM | POA: Diagnosis not present

## 2020-03-11 DIAGNOSIS — N1831 Chronic kidney disease, stage 3a: Secondary | ICD-10-CM | POA: Insufficient documentation

## 2020-03-11 DIAGNOSIS — Z8546 Personal history of malignant neoplasm of prostate: Secondary | ICD-10-CM | POA: Insufficient documentation

## 2020-03-11 DIAGNOSIS — I129 Hypertensive chronic kidney disease with stage 1 through stage 4 chronic kidney disease, or unspecified chronic kidney disease: Secondary | ICD-10-CM | POA: Diagnosis not present

## 2020-03-11 DIAGNOSIS — C61 Malignant neoplasm of prostate: Secondary | ICD-10-CM

## 2020-03-11 DIAGNOSIS — Z794 Long term (current) use of insulin: Secondary | ICD-10-CM | POA: Diagnosis not present

## 2020-03-11 DIAGNOSIS — R262 Difficulty in walking, not elsewhere classified: Secondary | ICD-10-CM | POA: Diagnosis not present

## 2020-03-11 DIAGNOSIS — R739 Hyperglycemia, unspecified: Secondary | ICD-10-CM

## 2020-03-11 DIAGNOSIS — I10 Essential (primary) hypertension: Secondary | ICD-10-CM

## 2020-03-11 DIAGNOSIS — R001 Bradycardia, unspecified: Secondary | ICD-10-CM | POA: Diagnosis not present

## 2020-03-11 DIAGNOSIS — Z79899 Other long term (current) drug therapy: Secondary | ICD-10-CM | POA: Diagnosis not present

## 2020-03-11 DIAGNOSIS — E785 Hyperlipidemia, unspecified: Secondary | ICD-10-CM

## 2020-03-11 DIAGNOSIS — N179 Acute kidney failure, unspecified: Secondary | ICD-10-CM | POA: Diagnosis not present

## 2020-03-11 DIAGNOSIS — N189 Chronic kidney disease, unspecified: Secondary | ICD-10-CM | POA: Diagnosis present

## 2020-03-11 LAB — BASIC METABOLIC PANEL
Anion gap: 14 (ref 5–15)
BUN: 38 mg/dL — ABNORMAL HIGH (ref 8–23)
CO2: 22 mmol/L (ref 22–32)
Calcium: 9.2 mg/dL (ref 8.9–10.3)
Chloride: 95 mmol/L — ABNORMAL LOW (ref 98–111)
Creatinine, Ser: 2.23 mg/dL — ABNORMAL HIGH (ref 0.61–1.24)
GFR calc Af Amer: 31 mL/min — ABNORMAL LOW (ref >60)
GFR calc non Af Amer: 27 mL/min — ABNORMAL LOW (ref >60)
Glucose, Bld: 623 mg/dL (ref 70–99)
Potassium: 4.5 mmol/L (ref 3.5–5.1)
Sodium: 131 mmol/L — ABNORMAL LOW (ref 135–145)

## 2020-03-11 LAB — TROPONIN I (HIGH SENSITIVITY)
Troponin I (High Sensitivity): 12 ng/L (ref <18)
Troponin I (High Sensitivity): 12 ng/L

## 2020-03-11 LAB — URINALYSIS, ROUTINE W REFLEX MICROSCOPIC
Bilirubin Urine: NEGATIVE
Glucose, UA: >=500 mg/dL — AB
Hgb urine dipstick: NEGATIVE
Ketones, ur: 5 mg/dL — AB
Nitrite: NEGATIVE
Protein, ur: NEGATIVE mg/dL
Specific Gravity, Urine: 1.02 (ref 1.005–1.030)
pH: 5 (ref 5.0–8.0)

## 2020-03-11 LAB — CBC
HCT: 40.6 % (ref 39.0–52.0)
Hemoglobin: 14 g/dL (ref 13.0–17.0)
MCH: 30.5 pg (ref 26.0–34.0)
MCHC: 34.5 g/dL (ref 30.0–36.0)
MCV: 88.5 fL (ref 80.0–100.0)
Platelets: 204 10*3/uL (ref 150–400)
RBC: 4.59 MIL/uL (ref 4.22–5.81)
RDW: 12.4 % (ref 11.5–15.5)
WBC: 8.6 10*3/uL (ref 4.0–10.5)
nRBC: 0 % (ref 0.0–0.2)

## 2020-03-11 LAB — HEPATIC FUNCTION PANEL
ALT: 15 U/L (ref 0–44)
AST: 17 U/L (ref 15–41)
Albumin: 3.8 g/dL (ref 3.5–5.0)
Alkaline Phosphatase: 87 U/L (ref 38–126)
Bilirubin, Direct: 0.2 mg/dL (ref 0.0–0.2)
Indirect Bilirubin: 0.5 mg/dL (ref 0.3–0.9)
Total Bilirubin: 0.7 mg/dL (ref 0.3–1.2)
Total Protein: 7.5 g/dL (ref 6.5–8.1)

## 2020-03-11 LAB — BLOOD GAS, VENOUS
Acid-base deficit: 0.1 mmol/L (ref 0.0–2.0)
Bicarbonate: 25.3 mmol/L (ref 20.0–28.0)
O2 Saturation: 65.5 %
Patient temperature: 98.6
pCO2, Ven: 46.5 mmHg (ref 44.0–60.0)
pH, Ven: 7.354 (ref 7.250–7.430)
pO2, Ven: 39.9 mmHg (ref 32.0–45.0)

## 2020-03-11 LAB — CBG MONITORING, ED
Glucose-Capillary: >600 mg/dL (ref 70–99)
Glucose-Capillary: 568 mg/dL (ref 70–99)
Glucose-Capillary: 427 mg/dL — ABNORMAL HIGH (ref 70–99)
Glucose-Capillary: 382 mg/dL — ABNORMAL HIGH (ref 70–99)
Glucose-Capillary: 208 mg/dL — ABNORMAL HIGH (ref 70–99)

## 2020-03-11 LAB — CK: Total CK: 38 U/L — ABNORMAL LOW (ref 49–397)

## 2020-03-11 LAB — SARS CORONAVIRUS 2 BY RT PCR (HOSPITAL ORDER, PERFORMED IN ~~LOC~~ HOSPITAL LAB): SARS Coronavirus 2: NEGATIVE

## 2020-03-11 LAB — MAGNESIUM: Magnesium: 2.5 mg/dL — ABNORMAL HIGH (ref 1.7–2.4)

## 2020-03-11 LAB — AMMONIA: Ammonia: 25 umol/L (ref 9–35)

## 2020-03-11 LAB — PHOSPHORUS: Phosphorus: 2 mg/dL — ABNORMAL LOW (ref 2.5–4.6)

## 2020-03-11 MED ORDER — INSULIN REGULAR(HUMAN) IN NACL 100-0.9 UT/100ML-% IV SOLN
INTRAVENOUS | Status: DC
  Administered 2020-03-11: 10.5 [IU]/h via INTRAVENOUS
  Filled 2020-03-11: qty 100

## 2020-03-11 MED ORDER — DEXTROSE-NACL 5-0.45 % IV SOLN: INTRAVENOUS | Status: DC

## 2020-03-11 MED ORDER — DEXTROSE 50 % IV SOLN: 0.0000 mL | INTRAVENOUS | Status: DC | PRN

## 2020-03-11 MED ORDER — SODIUM CHLORIDE 0.9 % IV BOLUS
1000.0000 mL | INTRAVENOUS | Status: AC
  Administered 2020-03-11: 1000 mL via INTRAVENOUS

## 2020-03-11 MED ORDER — SODIUM CHLORIDE 0.9 % IV SOLN: INTRAVENOUS | Status: DC

## 2020-03-11 MED ORDER — SODIUM CHLORIDE 0.9 % IV BOLUS
1000.0000 mL | Freq: Once | INTRAVENOUS | Status: AC
  Administered 2020-03-11: 1000 mL via INTRAVENOUS

## 2020-03-11 NOTE — H&P (Addendum)
Eric Wade YHC:[id number] DOB: 1941-05-31 DOA: 03/11/2020     PCP: [name], MD   Outpatient Specialists:  NONE    Patient arrived to ER on 03/11/20 at 40 Referred by Attending Sherwood Gambler, MD   Patient coming from: home Lives   With family    Chief Complaint:  Hyperglycemia   HPI: Eric Wade is a 79 y.o. male with medical history significant of diabetes mellitus type 2, gout, HLD, HTN, obesity, prostate cancer history of diabetic ankle ulcer.,  Anemia, history of stroke    Presented with initial blood sugar today read high patient gave himself 11 units of insulin and called EMS.  On their arrival he was given a 500 mL normal saline CBG was rechecked and was 588.  He was brought into [facility]. Patient endorsing overall feeling weak no chest pain or shortness of breath no nausea no vomiting no abdominal pain. His daughter stated that his blood sugar has been elevated for the past 1 week stay between 400-500.  He has been using his cane more frequently been feeling more fatigued.  Per daughter he has been eating a lot more fruit cups lately and she thought that that was causing his blood sugar to go up   Infectious risk factors:  Reports  fatigue     Has been vaccinated against COVID    Initial COVID TEST  NEGATIVE   Lab Results  Component Value Date   [place] 03/11/2020   [place] NEGATIVE 02/20/2019     Regarding pertinent Chronic problems:    Hyperlipidemia - on statins Lipitor Lipid Panel     Component Value Date/Time   CHOL 179 12/30/2018 0217   TRIG 59 12/30/2018 0217   HDL 66 12/30/2018 0217   CHOLHDL 2.7 12/30/2018 0217   VLDL 12 12/30/2018 0217   LDLCALC 101 (H) 12/30/2018 0217   LDLDIRECT 72.5 12/30/2010 0905     HTN on diltiazem hydralazine Imdur lisinopril   chronic CHF diastolic - last echo 5176 showed preserved EF     DM 2 -  Lab Results  Component Value Date   HGBA1C 13.6 (H) 12/30/2018    on  insulin     Hx of CVA 2020- *with/out residual deficits on Aspirin 81 mg     CKD stage III - baseline Cr 1.2 Estimated Creatinine Clearance: 30 mL/min (A) (by C-G formula based on SCr of 2.23 mg/dL (H)).  Lab Results  Component Value Date   CREATININE 2.23 (H) 03/11/2020   CREATININE 1.44 03/02/2019   CREATININE 1.26 (H) 02/23/2019    BPH - on Flomax,      While in ER: Noted to have AKI with creatinine up to 2.2 Patient appears to be dryAnd and dehydrated blood sugar above 600 Patient was started on insulin drip AG 14 Does not appear to be in DKA Hospitalist was called for admission for hyperglycemia  The following Work up has been ordered so far:  Orders Placed This Encounter  Procedures  . SARS Coronavirus 2 by RT PCR (hospital order, performed in [facility] hospital lab) Nasopharyngeal Nasopharyngeal Swab  . DG Chest Portable 1 View  . Basic metabolic panel  . CBC  . Urinalysis, Routine w reflex microscopic  . Diet NPO time specified  . [place]ine Lock IV, Maintain IV access  . Cardiac monitoring  . Initiate Carrier Fluid Protocol  . Notify physician  . If present, discontinue Insulin Pump after IV Insulin is initiated.  [name]  Do NOT use lab glucose values in [employer].  If CBG meter reads "Critical High", enter 600.  . Consult to hospitalist  ALL PATIENTS BEING ADMITTED/HAVING PROCEDURES NEED COVID-19 SCREENING  . Consult to hospitalist  ALL PATIENTS BEING ADMITTED/HAVING PROCEDURES NEED COVID-19 SCREENING  . Pulse oximetry, continuous  . CBG monitoring, ED  . CBG monitoring, ED  . ED EKG  . Insert peripheral IV    Following Medications were ordered in ER: Medications  insulin regular, human (MYXREDLIN) 100 units/ 100 mL infusion (has no administration in time range)  dextrose 5 %-0.45 % sodium chloride infusion (has no administration in time range)  dextrose 50 % solution 0-50 mL (has no administration in time range)  0.9 %  sodium chloride infusion (has no  administration in time range)  sodium chloride 0.9 % bolus 1,000 mL (1,000 mLs Intravenous New Bag/Given 03/11/20 1733)  sodium chloride 0.9 % bolus 1,000 mL (1,000 mLs Intravenous New Bag/Given 03/11/20 1831)        Consult Orders  (From admission, onward)         Start     Ordered   03/11/20 1755  Consult to hospitalist  ALL PATIENTS BEING ADMITTED/HAVING PROCEDURES NEED COVID-19 SCREENING  Once       Comments: ALL PATIENTS BEING ADMITTED/HAVING PROCEDURES NEED COVID-19 SCREENING  Provider:  (Not yet assigned)  Question Answer Comment  Place call to: [facility]   Reason for Consult Admit      03/11/20 1754   03/11/20 1754  Consult to hospitalist  ALL PATIENTS BEING ADMITTED/HAVING PROCEDURES NEED COVID-19 SCREENING  Once       Comments: ALL PATIENTS BEING ADMITTED/HAVING PROCEDURES NEED COVID-19 SCREENING  Provider:  (Not yet assigned)  Question Answer Comment  Place call to: [facility]   Reason for Consult Admit      03/11/20 1753          Significant initial  Findings: Abnormal Labs Reviewed  BASIC METABOLIC PANEL - Abnormal; Notable for the following components:      Result Value   Sodium 131 (*)    Chloride 95 (*)    Glucose, Bld 623 (*)    BUN 38 (*)    Creatinine, Ser 2.23 (*)    GFR calc non Af Amer 27 (*)    GFR calc Af Amer 31 (*)    All other components within normal limits  CBG MONITORING, ED - Abnormal; Notable for the following components:   Glucose-Capillary >600 (*)    All other components within normal limits  CBG MONITORING, ED - Abnormal; Notable for the following components:   Glucose-Capillary 568 (*)    All other components within normal limits    Otherwise labs showing:    Recent Labs  Lab 03/11/20 1618  NA 131*  K 4.5  CO2 22  GLUCOSE 623*  BUN 38*  CREATININE 2.23*  CALCIUM 9.2    Cr    Up from baseline see below Lab Results  Component Value Date   CREATININE 2.23 (H) 03/11/2020   CREATININE 1.44 03/02/2019     CREATININE 1.26 (H) 02/23/2019    No results for input(s): AST, ALT, ALKPHOS, BILITOT, PROT, ALBUMIN in the last 168 hours. Lab Results  Component Value Date   CALCIUM 9.2 03/11/2020   PHOS 1.8 (L) 01/03/2019     WBC      Component Value Date/Time   WBC 8.6 03/11/2020 1618   ANC    Component Value Date/Time     NEUTROABS 3.5 03/02/2019 1055   ALC No components found for: LYMPHAB    Plt: Lab Results  Component Value Date   PLT 204 03/11/2020       COVID-19 Labs  No results for input(s): DDIMER, FERRITIN, LDH, CRP in the last 72 hours.  Lab Results  Component Value Date   SARSCOV2NAA NEGATIVE 03/11/2020   SARSCOV2NAA NEGATIVE 02/20/2019     HG/HCT  stable,       Component Value Date/Time   HGB 14.0 03/11/2020 1618   HCT 40.6 03/11/2020 1618    No results for input(s): LIPASE, AMYLASE in the last 168 hours. No results for input(s): AMMONIA in the last 168 hours.     Troponin  ordered Cardiac Panel (last 3 results) Recent Labs    03/11/20 1618  CKTOTAL 38*       ECG: Ordered Personally reviewed by me showing: HR : 50 Rhythm:  , Sinus bradycardia    no evidence of ischemic changes QTC 484    DM  labs:  HbA1C: No results for input(s): HGBA1C in the last 8760 hours.     CBG (last 3)  Recent Labs    03/11/20 1824 03/11/20 1953 03/11/20 2110  GLUCAP 568* 427* 382*       UA  no evidence of UTI      Urine analysis:    Component Value Date/Time   COLORURINE AMBER (A) 03/11/2020 1618   APPEARANCEUR CLEAR 03/11/2020 1618   LABSPEC 1.020 03/11/2020 1618   PHURINE 5.0 03/11/2020 1618   GLUCOSEU >=500 (A) 03/11/2020 1618   GLUCOSEU NEGATIVE 03/11/2017 1553   HGBUR NEGATIVE 03/11/2020 1618   BILIRUBINUR NEGATIVE 03/11/2020 1618   KETONESUR 5 (A) 03/11/2020 1618   PROTEINUR NEGATIVE 03/11/2020 1618   UROBILINOGEN 0.2 03/11/2017 1553   NITRITE NEGATIVE 03/11/2020 1618   LEUKOCYTESUR TRACE (A) 03/11/2020 1618      Ordered    CXR -  NON acute     ED Triage Vitals  Enc Vitals Group     BP 03/11/20 1555 133/63     Pulse Rate 03/11/20 1555 (!) 57     Resp 03/11/20 1555 18     Temp 03/11/20 1555 98.2 F (36.8 C)     Temp Source 03/11/20 1555 Oral     SpO2 03/11/20 1555 95 %     Weight 03/11/20 1601 180 lb (81.6 kg)     Height 03/11/20 1601 6' 2" (1.88 m)     Head Circumference --      Peak Flow --      Pain Score 03/11/20 1601 0     Pain Loc --      Pain Edu? --      Excl. in GC? --   TMAX(24)@       Latest  Blood pressure 140/62, pulse (!) 47, temperature 98.2 F (36.8 C), temperature source Oral, resp. rate 19, height 6' 2" (1.88 m), weight 81.6 kg, SpO2 97 %.   Review of Systems:    Pertinent positives include:   Fatigue, hyperglycemia  Constitutional:  No weight loss, night sweats, Fevers, chills, weight loss  HEENT:  No headaches, Difficulty swallowing,Tooth/dental problems,Sore throat,  No sneezing, itching, ear ache, nasal congestion, post nasal drip,  Cardio-vascular:  No chest pain, Orthopnea, PND, anasarca, dizziness, palpitations.no Bilateral lower extremity swelling  GI:  No heartburn, indigestion, abdominal pain, nausea, vomiting, diarrhea, change in bowel habits, loss of appetite, melena, blood in stool, hematemesis Resp:  no shortness of   breath at rest. No dyspnea on exertion, No excess mucus, no productive cough, No non-productive cough, No coughing up of blood.No change in color of mucus.No wheezing. Skin:  no rash or lesions. No jaundice GU:  no dysuria, change in color of urine, no urgency or frequency. No straining to urinate.  No flank pain.  Musculoskeletal:  No joint pain or no joint swelling. No decreased range of motion. No back pain.  Psych:  No change in mood or affect. No depression or anxiety. No memory loss.  Neuro: no localizing neurological complaints, no tingling, no weakness, no double vision, no gait abnormality, no slurred speech, no confusion  All systems  reviewed and apart from HOPI all are negative  Past Medical History:   Past Medical History:  Diagnosis Date  . Allergic rhinitis 08/25/2016  . DIABETES MELLITUS, TYPE II 05/08/2007  . ERECTILE DYSFUNCTION 05/08/2007  . GOUT 07/01/2007  . HYPERLIPIDEMIA 05/08/2007  . HYPERTENSION 05/08/2007  . OBESITY 05/08/2007  . Preventative health care 12/28/2010  . Prostate cancer (HCC) 07/02/2008   Qualifier: Diagnosis of  By: John MD, James W   . PSA, INCREASED 07/02/2008  . RENAL INSUFFICIENCY 07/01/2007  . Seasonal allergies 12/31/2018    History reviewed. No pertinent surgical history.  Social History:  Ambulatory   Cane      reports that he has quit smoking. He has never used smokeless tobacco. He reports that he does not drink alcohol and does not use drugs.   Family History:   Family History  Problem Relation Age of Onset  . Hypertension Other     Allergies: Allergies  Allergen Reactions  . Nsaids     REACTION: renal insufficiency     Prior to Admission medications   Medication Sig Start Date End Date Taking? Authorizing Provider  albuterol (VENTOLIN HFA) 108 (90 Base) MCG/ACT inhaler Inhale 2 puffs into the lungs every 6 (six) hours as needed for wheezing or shortness of breath.  12/01/19  Yes [provider]  atorvastatin (LIPITOR) 20 MG tablet TAKE 1 TABLET (20 MG TOTAL) BY MOUTH DAILY AT 6 PM. Patient taking differently: Take 20 mg by mouth in the morning and at bedtime.  02/02/20  Yes John, James W, MD  diltiazem (DILT-XR) 240 MG 24 hr capsule Take 1 capsule (240 mg total) by mouth daily. 10/23/19  Yes John, James W, MD  furosemide (LASIX) 40 MG tablet Take 40 mg by mouth daily.  01/18/20  Yes [provider]  hydrALAZINE (APRESOLINE) 50 MG tablet Take 1.5 tablets (75 mg total) by mouth every 6 (six) hours. 10/23/19  Yes John, James W, MD  insulin NPH Human (NOVOLIN N) 100 UNIT/ML injection Inject 0.12 mLs (12 Units total) into the skin daily before  breakfast. Patient taking differently: Inject 0-12 Units into the skin in the morning, at noon, and at bedtime.  02/24/19  Yes Swayze, Ava, DO  LANTUS 100 UNIT/ML injection Inject 6 Units into the skin at bedtime.  11/28/19  Yes [provider]  tamsulosin (FLOMAX) 0.4 MG CAPS capsule Take 0.4 mg by mouth at bedtime. 02/06/19  Yes [provider]  Accu-Chek FastClix Lancets MISC USE TO CHECK BLOOD SUGAR UP TO 4 TIMES A DAY (E11.9) 10/07/19   John, James W, MD  aspirin 81 MG EC tablet Take 81 mg by mouth every evening.  Patient not taking: Reported on 03/11/2020    [provider]  blood glucose meter kit and supplies KIT Pt receives from [facility]   01/03/19   Xu, Fang, MD  blood glucose meter kit and supplies KIT Dispense based on patient and insurance preference. Use up to four times daily as directed. (FOR ICD-9 250.00, 250.01). 01/03/19   Xu, Fang, MD  blood glucose meter kit and supplies Use four times daily as directed E11.9 02/15/19   John, James W, MD  dorzolamide-timolol (COSOPT) 22.3-6.8 MG/ML ophthalmic solution Place 1 drop into both eyes daily.  Patient not taking: Reported on 03/11/2020 03/01/20   [provider]  glucose blood (ACCU-CHEK GUIDE) test strip Use to check blood sugar up to 4 times a day. 03/23/19   John, James W, MD  insulin NPH Human (NOVOLIN N) 100 UNIT/ML injection Inject 0.06 mLs (6 Units total) into the skin at bedtime. Patient not taking: Reported on 03/11/2020 02/23/19   Swayze, Ava, DO  insulin NPH-regular Human (70-30) 100 UNIT/ML injection Inject into the skin. Patient not taking: Reported on 03/11/2020    [provider]  isosorbide mononitrate (IMDUR) 30 MG 24 hr tablet TAKE 1 TABLET BY MOUTH EVERY DAY Patient not taking: Reported on 03/11/2020 06/08/19   John, James W, MD  lactulose (CHRONULAC) 10 GM/15ML solution Take 15 mLs (10 g total) by mouth daily. Patient not taking: Reported on 03/11/2020 01/03/19   Xu, Fang, MD  Lancet Devices  (AUTO-LANCET) MISC Pt received from [facility] 01/10/15   John, James W, MD  lisinopril (ZESTRIL) 20 MG tablet Take 1 tablet (20 mg total) by mouth daily. Must keep May 12th appt for future refills Patient not taking: Reported on 03/11/2020 01/05/19   John, James W, MD  Maltodextrin-Xanthan Gum (RESOURCE THICKENUP CLEAR) POWD For nectar thick liquid Patient not taking: Reported on 03/11/2020 01/03/19   Xu, Fang, MD  terbinafine (LAMISIL) 250 MG tablet Take 250 mg by mouth daily. Patient not taking: Reported on 03/11/2020 01/19/20   [provider]  traZODone (DESYREL) 100 MG tablet  12/25/19   [provider]   Physical Exam: Vitals with BMI 03/11/2020 03/11/2020 03/11/2020  Height - 6' 2" -  Weight - 180 lbs -  BMI - 23.1 -  Systolic 140 - 133  Diastolic 62 - 63  Pulse 47 - 57     1. General:  in No Acute distress   Chronically ill -appearing 2. Psychological: Alert and Oriented 3. Head/ENT:   Dry Mucous Membranes                          Head Non traumatic, neck supple                        Poor Dentition 4. SKIN:   decreased Skin turgor,  Skin clean Dry and intact no rash 5. Heart: Regular rate and rhythm  Systolic Murmur, no Rub or gallop 6. Lungs: no wheezes or crackles   7. Abdomen: Soft,  non-tender, Non distended bowel sounds present 8. Lower extremities: no clubbing, cyanosis, no  edema 9. Neurologically Grossly intact, moving all 4 extremities equally   10. MSK: Normal range of motion   All other LABS:     Recent Labs  Lab 03/11/20 1618  WBC 8.6  HGB 14.0  HCT 40.6  MCV 88.5  PLT 204     Recent Labs  Lab 03/11/20 1618  NA 131*  K 4.5  CL 95*  CO2 22  GLUCOSE 623*  BUN 38*  CREATININE 2.23*  CALCIUM 9.2       No results for input(s): AST, ALT, ALKPHOS, BILITOT, PROT, ALBUMIN in the last 168 hours.     Cultures:    Component Value Date/Time   SDES URINE, CLEAN CATCH 02/22/2019 1921   SPECREQUEST  02/22/2019 1921    NONE Performed at  [facility] [address], [place], [place] [address]    CULT >=100,000 COLONIES/mL KLEBSIELLA PNEUMONIAE (A) 02/22/2019 1921   REPTSTATUS 02/24/2019 FINAL 02/22/2019 1921     Radiological Exams on Admission: DG Chest Portable 1 View  Result Date: 03/11/2020 CLINICAL DATA:  Hyperglycemia, hypertension, diabetes, tobacco abuse EXAM: PORTABLE CHEST 1 VIEW COMPARISON:  02/20/2019 FINDINGS: Single frontal view of the chest demonstrates an unremarkable cardiac silhouette. No airspace disease, effusion, or pneumothorax. No acute bony abnormalities. IMPRESSION: 1. No acute intrathoracic process. Electronically Signed   By: Randa Ngo M.D.   On: 03/11/2020 17:19    Chart has been reviewed   Assessment/Plan   79 y.o. male with medical history significant of diabetes mellitus type 2, gout, HLD, HTN, obesity, prostate cancer history of diabetic ankle ulcer.,  Anemia, history of stroke  Admitted for hyperglycemia and bradycardia  Present on Admission: . Hyperglycemia/HHS   -will admit per HHS protocol, obtain serial BMET, start on glucosestabalizer  IVF.   [name] So far work up of possible causes of DKA/HSS with CXR, ECG one set of cardiac enzymes(pending), UA.  have been unremarkable  Most likely cause been dietary  noncompliance Monitor in Stepdown. Replace potassium as needed.    Consult diabetes coordinator    . Acute kidney injury superimposed on chronic kidney disease ([place]) in the setting of hyperglycemia.  We will rehydrate and follow renal function status avoid nephrotoxic medications obtain urine electrolytes  . Essential hypertension -hold diltiazem given bradycardia.  Continue hydralazine for now hold  ACE inhibitor restart  Imdur if able unsure if been taking at home   . Hyperlipidemia -stable continue home medications  . Prostate cancer [facility]) follow-up as an outpatient   . Bradycardia hold on diltiazem check TSH monitor on telemetry, will notify Cardiology given  significant bradycardia down to 61'B  Systolic murmur - will order echo  Other plan as per orders.  DVT prophylaxis:   Lovenox       Code Status:    Code Status: Prior FULL CODE  as per patient   I had personally discussed CODE STATUS with patient      Family Communication:   Family not at  Bedside    Disposition Plan:     To home once workup is complete and patient is stable   Following barriers for discharge:                           Hyperglycemia  corrected                                                        Will need to be able to tolerate PO                            Will likely need home health, home O2, set up  EXPECT DC tomorrow                    Would benefit from PT/OT eval prior to DC  Ordered                                      Diabetes care coordinator                  Consults called:none  Admission status:  ED Disposition    ED Disposition Condition Comment   Admit  Hospital Area: [facility] [[id number]] Level of Care: Stepdown [14] Admit to SDU based on following criteria: Severe physiological/psychological symptoms:  Any diagnosis requiring assessment & intervention at least every 4 hours on an o ngoing basis to obtain desired patient outcomes including stability and rehabilitation Covid Evaluation: COVID negative Diagnosis: Hyperglycemia [[id number]] Admitting Physician: ,  [[id number]] Attending Physician: ,  [[id number]]         Obs       Level of care         SDU tele indefinitely please discontinue once patient no longer qualifies COVID-19 Labs    Lab Results  Component Value Date   SARSCOV2NAA NEGATIVE 03/11/2020     Precautions: admitted as  asymptomatic screening protocol    PPE: Used by the provider:   N95  eye Goggles,  Gloves        03/11/2020, 9:38 PM    [facility]     after 2 AM please page floor coverage PA If 7AM-7PM,  please contact the day team taking care of the patient using [email or web address]   Patient was evaluated in the context of the global COVID-19 pandemic, which necessitated consideration that the patient might be at risk for infection with the SARS-CoV-2 virus that causes COVID-19. Institutional protocols and algorithms that pertain to the evaluation of patients at risk for COVID-19 are in a state of rapid change based on information released by regulatory bodies including the CDC and federal and state organizations. These policies and algorithms were followed during the patient's care.     

## 2020-03-11 NOTE — ED Notes (Signed)
Date and time results received: 03/11/20 5:20 PM  (use smartphrase ".now" to insert current time)  Test: Glucose Critical Value: 623  Name of Provider Notified: Rolla Plate PA  Orders Received? Or Actions Taken?: Orders Received - See Orders for details

## 2020-03-11 NOTE — ED Notes (Signed)
[place] collected and sent to lab at this time.

## 2020-03-11 NOTE — ED Provider Notes (Signed)
[facility] DEPT Provider Note   CSN: [id number] Arrival date & time: 03/11/20  1546     History No chief complaint on file.   Eric Wade is a 79 y.o. male.  HPI  Patient is an 79 year old male with medical history as noted below.  Patient states he was feeling weak today noticed his blood sugar was significantly elevated so he called EMS.  He has no complaints at this time.  He states that "he took some insulin" before he came to the ED. Per nursing noted, he took 11 units of insulin prior to arrival. He was given 500 ml of NS in route. He denies any chest pain, shortness of breath, nausea, vomiting, abdominal pain, syncope.    I spoke to his daughter he states that she noticed that his blood sugar was in 400s and 500s all last week. He is typically very active but his daughter states that he hasn't been getting up as much and is using his cane more frequently. Pt has been eating "more fruit cups" lately.      Past Medical History:  Diagnosis Date  . Allergic rhinitis 08/25/2016  . DIABETES MELLITUS, TYPE II 05/08/2007  . ERECTILE DYSFUNCTION 05/08/2007  . GOUT 07/01/2007  . HYPERLIPIDEMIA 05/08/2007  . HYPERTENSION 05/08/2007  . OBESITY 05/08/2007  . Preventative health care 12/28/2010  . Prostate cancer ([place]) 07/02/2008   Qualifier: Diagnosis of  By: Jenny Reichmann MD, Hunt Oris   . PSA, INCREASED 07/02/2008  . RENAL INSUFFICIENCY 07/01/2007  . Seasonal allergies 12/31/2018    Patient Active Problem List   Diagnosis Date Noted  . Diabetic ulcer of ankle ([place]) 02/21/2019  . AMS (altered mental status) 02/21/2019  . Neurology follow-up encounter 02/20/2019  . Episodic altered awareness 02/20/2019  . Acute kidney injury superimposed on chronic kidney disease ([place]) 02/20/2019  . Normocytic anemia 02/20/2019  . Increased ammonia level 01/10/2019  . Hypokalemia   . Hypophosphatemia   . Acute metabolic encephalopathy   . Encephalopathy acute   . Stroke ([place])  12/28/2018  . Allergic rhinitis 08/25/2016  . Preventative health care 12/28/2010  . Prostate cancer ([place]) 07/02/2008  . GOUT 07/01/2007  . CKD (chronic kidney disease) stage 3, GFR 30-59 ml/min 07/01/2007  . Insulin dependent diabetes mellitus 05/08/2007  . Hyperlipidemia 05/08/2007  . OBESITY 05/08/2007  . ERECTILE DYSFUNCTION 05/08/2007  . Essential hypertension 05/08/2007    History reviewed. No pertinent surgical history.     Family History  Problem Relation Age of Onset  . Hypertension Other     Social History   Tobacco Use  . Smoking status: Former Research scientist (life sciences)  . Smokeless tobacco: Never Used  Vaping Use  . Vaping Use: Never used  Substance Use Topics  . Alcohol use: No  . Drug use: No    Home Medications Prior to Admission medications   Medication Sig Start Date End Date Taking? Authorizing Provider  Accu-Chek FastClix Lancets MISC USE TO CHECK BLOOD SUGAR UP TO 4 TIMES A DAY (E11.9) 10/07/19   [name], MD  aspirin 81 MG EC tablet Take 81 mg by mouth daily.      [provider]  atorvastatin (LIPITOR) 20 MG tablet TAKE 1 TABLET (20 MG TOTAL) BY MOUTH DAILY AT 6 PM. 02/02/20   [name], MD  blood glucose meter kit and supplies KIT Pt receives from [facility] 01/03/19   Florencia Reasons, MD  blood glucose meter kit and supplies KIT Dispense based on  patient and insurance preference. Use up to four times daily as directed. (FOR ICD-9 250.00, 250.01). 01/03/19   Florencia Reasons, MD  blood glucose meter kit and supplies Use four times daily as directed E11.9 02/15/19   [name], MD  diltiazem (DILT-XR) 240 MG 24 hr capsule Take 1 capsule (240 mg total) by mouth daily. 10/23/19   [name], MD  glucose blood (ACCU-CHEK GUIDE) test strip Use to check blood sugar up to 4 times a day. 03/23/19   [name], MD  hydrALAZINE (APRESOLINE) 50 MG tablet Take 1.5 tablets (75 mg total) by mouth every 6 (six) hours. 10/23/19   [name], MD  insulin NPH Human (NOVOLIN N) 100  UNIT/ML injection Inject 0.12 mLs (12 Units total) into the skin daily before breakfast. 02/24/19   Swayze, Ava, DO  insulin NPH Human (NOVOLIN N) 100 UNIT/ML injection Inject 0.06 mLs (6 Units total) into the skin at bedtime. 02/23/19   Swayze, Ava, DO  isosorbide mononitrate (IMDUR) 30 MG 24 hr tablet TAKE 1 TABLET BY MOUTH EVERY DAY 06/08/19   [name], MD  lactulose (CHRONULAC) 10 GM/15ML solution Take 15 mLs (10 g total) by mouth daily. 01/03/19   Florencia Reasons, MD  Lancet Devices (AUTO-LANCET) MISC Pt received from [facility] 01/10/15   [name], MD  lisinopril (ZESTRIL) 20 MG tablet Take 1 tablet (20 mg total) by mouth daily. Must keep May 12th appt for future refills 01/05/19   [name], MD  Maltodextrin-Xanthan Gum (Romeo) POWD For nectar thick liquid 01/03/19   Florencia Reasons, MD  tamsulosin (FLOMAX) 0.4 MG CAPS capsule Take 0.4 mg by mouth at bedtime. 02/06/19   [provider]    Allergies    Nsaids  Review of Systems   Review of Systems  All other systems reviewed and are negative. Ten systems reviewed and are negative for acute change, except as noted in the HPI.    Physical Exam Updated Vital Signs BP 133/63 (BP Location: Right Arm)   Pulse (!) 57   Temp 98.2 F (36.8 C) (Oral)   Resp 18   Ht '6\' 2"'  (1.88 m)   Wt 81.6 kg   SpO2 95% Comment: Simultaneous filing. User may not have seen previous data.  BMI 23.11 kg/m   Physical Exam Vitals and nursing note reviewed.  Constitutional:      General: He is not in acute distress.    Appearance: Normal appearance. He is normal weight. He is not ill-appearing, toxic-appearing or diaphoretic.  HENT:     Head: Normocephalic and atraumatic.     Right Ear: External ear normal.     Left Ear: External ear normal.     Nose: Nose normal.     Mouth/Throat:     Mouth: Mucous membranes are dry.     Pharynx: Oropharynx is clear. No oropharyngeal exudate or posterior oropharyngeal erythema.  Eyes:     General: No  scleral icterus.       Right eye: No discharge.        Left eye: No discharge.     Extraocular Movements: Extraocular movements intact.     Conjunctiva/sclera: Conjunctivae normal.  Cardiovascular:     Rate and Rhythm: Regular rhythm. Bradycardia present.     Pulses: Normal pulses.     Heart sounds: Normal heart sounds. No murmur heard.  No friction rub. No gallop.   Pulmonary:     Effort: Pulmonary effort is  normal. No respiratory distress.     Breath sounds: Normal breath sounds. No stridor. No wheezing, rhonchi or rales.  Abdominal:     General: Abdomen is flat.     Palpations: Abdomen is soft.     Tenderness: There is no abdominal tenderness.  Musculoskeletal:        General: Normal range of motion.     Cervical back: Normal range of motion and neck supple. No tenderness.  Skin:    General: Skin is warm and dry.  Neurological:     General: No focal deficit present.     Mental Status: He is alert and oriented to person, place, and time.     Comments: Pt is oriented x 4.  Patient is speaking clearly, coherently, in complete sentences.  Patient is able to move all 4 extremities spontaneously without difficulty.  Psychiatric:        Mood and Affect: Mood normal.        Behavior: Behavior normal.    ED Results / Procedures / Treatments   Labs (all labs ordered are listed, but only abnormal results are displayed) Labs Reviewed  BASIC METABOLIC PANEL - Abnormal; Notable for the following components:      Result Value   Sodium 131 (*)    Chloride 95 (*)    Glucose, Bld 623 (*)    BUN 38 (*)    Creatinine, Ser 2.23 (*)    GFR calc non Af Amer 27 (*)    GFR calc Af Amer 31 (*)    All other components within normal limits  CBG MONITORING, ED - Abnormal; Notable for the following components:   Glucose-Capillary >600 (*)    All other components within normal limits  CBG MONITORING, ED - Abnormal; Notable for the following components:   Glucose-Capillary 568 (*)    All other  components within normal limits  SARS CORONAVIRUS 2 BY RT PCR (HOSPITAL ORDER, [facility])  CBC  URINALYSIS, ROUTINE W REFLEX MICROSCOPIC  HEPATIC FUNCTION PANEL  CK   EKG EKG Interpretation  Date/Time:  Monday March 11 2020 17:05:23 EDT Ventricular Rate:  50 PR Interval:    QRS Duration: 106 QT Interval:  530 QTC Calculation: 484 R Axis:   36 Text Interpretation: Sinus bradycardia Abnormal R-wave progression, early transition Borderline prolonged QT interval similar to June 2020 Confirmed by Sherwood Gambler [phone]) on 03/11/2020 5:10:43 PM  Radiology DG Chest Portable 1 View  Result Date: 03/11/2020 CLINICAL DATA:  Hyperglycemia, hypertension, diabetes, tobacco abuse EXAM: PORTABLE CHEST 1 VIEW COMPARISON:  02/20/2019 FINDINGS: Single frontal view of the chest demonstrates an unremarkable cardiac silhouette. No airspace disease, effusion, or pneumothorax. No acute bony abnormalities. IMPRESSION: 1. No acute intrathoracic process. Electronically Signed   By: Randa Ngo M.D.   On: 03/11/2020 17:19   Procedures Procedures   Medications Ordered in ED Medications  insulin regular, human (MYXREDLIN) 100 units/ 100 mL infusion (10.5 Units/hr Intravenous New Bag/Given 03/11/20 1840)  dextrose 5 %-0.45 % sodium chloride infusion (has no administration in time range)  dextrose 50 % solution 0-50 mL (has no administration in time range)  0.9 %  sodium chloride infusion (has no administration in time range)  sodium chloride 0.9 % bolus 1,000 mL (1,000 mLs Intravenous New Bag/Given 03/11/20 1733)  sodium chloride 0.9 % bolus 1,000 mL (1,000 mLs Intravenous New Bag/Given 03/11/20 1831)    ED Course  I have reviewed the triage vital signs and the nursing notes.  Pertinent labs & imaging results that were available during my care of the patient were reviewed by me and considered in my medical decision making (see chart for details).  Clinical Course as of Mar 12 1839  Mon Mar 11, 2020  1747 Elevated from 1.44, 1 year ago  Creatinine(!): 2.23 [LJ]  1748 Glucose(!!): 623 [LJ]  1748 Potassium: 4.5 [LJ]  1748 Anion gap: 14 [LJ]    Clinical Course User Index [LJ] Rayna Sexton, PA-C   MDM Rules/Calculators/A&P                          Pt is a 79 y.o. male that present with a history, physical exam, ED Clinical Course as noted above.   Patient presents today due to uncontrolled diabetes mellitus resulting in hyperglycemia. No anion gap at this time. Exam not concerning for DKA at this time. Patient started on insulin as well as given IV fluids. Potassium is 4.5. Elevated creatinine at 2.23. Possible AKI. Likely 2/2 dehydration.   Discussed with hospitalist who will admit the patient at this time. Added hepatic function panel and CK, per hospitalist request. COVID-19 test ordered.  Note: Portions of this report may have been transcribed using voice recognition software. Every effort was made to ensure accuracy; however, inadvertent computerized transcription errors may be present.   Final Clinical Impression(s) / ED Diagnoses Final diagnoses:  Hyperglycemia due to diabetes mellitus ([place])  AKI (acute kidney injury) ([place])  Dehydration    Rx / DC Orders ED Discharge Orders    None       Rayna Sexton, PA-C 03/11/20 1842    Sherwood Gambler, MD 03/12/20 0011

## 2020-03-11 NOTE — ED Triage Notes (Signed)
Per EMS- Patient's initial CBG read "high." Patient had given himself 11 units insulin approx 1  Hour ago. Patient also received 500 ml NS prior to arrival to the ED.  Last CBG- 588.  CBG  In triage -read high.

## 2020-03-12 ENCOUNTER — Other Ambulatory Visit (HOSPITAL_COMMUNITY): Payer: Medicare HMO

## 2020-03-12 ENCOUNTER — Other Ambulatory Visit: Payer: Self-pay | Admitting: Physician Assistant

## 2020-03-12 ENCOUNTER — Telehealth: Payer: Self-pay

## 2020-03-12 DIAGNOSIS — E860 Dehydration: Secondary | ICD-10-CM | POA: Diagnosis not present

## 2020-03-12 DIAGNOSIS — I10 Essential (primary) hypertension: Secondary | ICD-10-CM | POA: Diagnosis not present

## 2020-03-12 DIAGNOSIS — N179 Acute kidney failure, unspecified: Secondary | ICD-10-CM | POA: Diagnosis not present

## 2020-03-12 DIAGNOSIS — E1165 Type 2 diabetes mellitus with hyperglycemia: Secondary | ICD-10-CM | POA: Diagnosis not present

## 2020-03-12 DIAGNOSIS — R9431 Abnormal electrocardiogram [ECG] [EKG]: Secondary | ICD-10-CM | POA: Diagnosis not present

## 2020-03-12 DIAGNOSIS — R011 Cardiac murmur, unspecified: Secondary | ICD-10-CM

## 2020-03-12 DIAGNOSIS — R072 Precordial pain: Secondary | ICD-10-CM

## 2020-03-12 DIAGNOSIS — N189 Chronic kidney disease, unspecified: Secondary | ICD-10-CM | POA: Diagnosis not present

## 2020-03-12 DIAGNOSIS — R001 Bradycardia, unspecified: Secondary | ICD-10-CM | POA: Diagnosis not present

## 2020-03-12 LAB — BASIC METABOLIC PANEL
Anion gap: 14 (ref 5–15)
Anion gap: 7 (ref 5–15)
Anion gap: 8 (ref 5–15)
BUN: 24 mg/dL — ABNORMAL HIGH (ref 8–23)
BUN: 25 mg/dL — ABNORMAL HIGH (ref 8–23)
BUN: 25 mg/dL — ABNORMAL HIGH (ref 8–23)
CO2: 23 mmol/L (ref 22–32)
CO2: 25 mmol/L (ref 22–32)
CO2: 23 mmol/L (ref 22–32)
Calcium: 9.1 mg/dL (ref 8.9–10.3)
Calcium: 8.9 mg/dL (ref 8.9–10.3)
Calcium: 9.1 mg/dL (ref 8.9–10.3)
Chloride: 107 mmol/L (ref 98–111)
Chloride: 106 mmol/L (ref 98–111)
Chloride: 104 mmol/L (ref 98–111)
Creatinine, Ser: 1.49 mg/dL — ABNORMAL HIGH (ref 0.61–1.24)
Creatinine, Ser: 1.57 mg/dL — ABNORMAL HIGH (ref 0.61–1.24)
Creatinine, Ser: 1.56 mg/dL — ABNORMAL HIGH (ref 0.61–1.24)
GFR calc Af Amer: 50 mL/min — ABNORMAL LOW (ref >60)
GFR calc Af Amer: 47 mL/min — ABNORMAL LOW (ref >60)
GFR calc Af Amer: 48 mL/min — ABNORMAL LOW (ref >60)
GFR calc non Af Amer: 43 mL/min — ABNORMAL LOW (ref >60)
GFR calc non Af Amer: 41 mL/min — ABNORMAL LOW (ref >60)
GFR calc non Af Amer: 41 mL/min — ABNORMAL LOW (ref >60)
Glucose, Bld: 162 mg/dL — ABNORMAL HIGH (ref 70–99)
Glucose, Bld: 266 mg/dL — ABNORMAL HIGH (ref 70–99)
Glucose, Bld: 278 mg/dL — ABNORMAL HIGH (ref 70–99)
Potassium: 3.5 mmol/L (ref 3.5–5.1)
Potassium: 3.7 mmol/L (ref 3.5–5.1)
Potassium: 3.5 mmol/L (ref 3.5–5.1)
Sodium: 144 mmol/L (ref 135–145)
Sodium: 138 mmol/L (ref 135–145)
Sodium: 135 mmol/L (ref 135–145)

## 2020-03-12 LAB — COMPREHENSIVE METABOLIC PANEL
ALT: 13 U/L (ref 0–44)
AST: 18 U/L (ref 15–41)
Albumin: 3.3 g/dL — ABNORMAL LOW (ref 3.5–5.0)
Alkaline Phosphatase: 60 U/L (ref 38–126)
Anion gap: 6 (ref 5–15)
BUN: 26 mg/dL — ABNORMAL HIGH (ref 8–23)
CO2: 27 mmol/L (ref 22–32)
Calcium: 8.6 mg/dL — ABNORMAL LOW (ref 8.9–10.3)
Chloride: 112 mmol/L — ABNORMAL HIGH (ref 98–111)
Creatinine, Ser: 1.58 mg/dL — ABNORMAL HIGH (ref 0.61–1.24)
GFR calc Af Amer: 47 mL/min — ABNORMAL LOW (ref >60)
GFR calc non Af Amer: 40 mL/min — ABNORMAL LOW (ref >60)
Glucose, Bld: 114 mg/dL — ABNORMAL HIGH (ref 70–99)
Potassium: 3.1 mmol/L — ABNORMAL LOW (ref 3.5–5.1)
Sodium: 145 mmol/L (ref 135–145)
Total Bilirubin: 0.7 mg/dL (ref 0.3–1.2)
Total Protein: 7 g/dL (ref 6.5–8.1)

## 2020-03-12 LAB — CBG MONITORING, ED
Glucose-Capillary: 107 mg/dL — ABNORMAL HIGH (ref 70–99)
Glucose-Capillary: 112 mg/dL — ABNORMAL HIGH (ref 70–99)
Glucose-Capillary: 142 mg/dL — ABNORMAL HIGH (ref 70–99)
Glucose-Capillary: 171 mg/dL — ABNORMAL HIGH (ref 70–99)
Glucose-Capillary: 172 mg/dL — ABNORMAL HIGH (ref 70–99)
Glucose-Capillary: 194 mg/dL — ABNORMAL HIGH (ref 70–99)
Glucose-Capillary: 207 mg/dL — ABNORMAL HIGH (ref 70–99)
Glucose-Capillary: 94 mg/dL (ref 70–99)

## 2020-03-12 LAB — HEMOGLOBIN A1C
Hgb A1c MFr Bld: 13.3 % — ABNORMAL HIGH (ref 4.8–5.6)
Mean Plasma Glucose: 335.01 mg/dL

## 2020-03-12 LAB — CBC WITH DIFFERENTIAL/PLATELET
Abs Immature Granulocytes: 0.03 10*3/uL (ref 0.00–0.07)
Basophils Absolute: 0 10*3/uL (ref 0.0–0.1)
Basophils Relative: 0 %
Eosinophils Absolute: 0.1 10*3/uL (ref 0.0–0.5)
Eosinophils Relative: 2 %
HCT: 39.6 % (ref 39.0–52.0)
Hemoglobin: 13.3 g/dL (ref 13.0–17.0)
Immature Granulocytes: 0 %
Lymphocytes Relative: 39 %
Lymphs Abs: 3 10*3/uL (ref 0.7–4.0)
MCH: 30.2 pg (ref 26.0–34.0)
MCHC: 33.6 g/dL (ref 30.0–36.0)
MCV: 89.8 fL (ref 80.0–100.0)
Monocytes Absolute: 0.7 10*3/uL (ref 0.1–1.0)
Monocytes Relative: 10 %
Neutro Abs: 3.8 10*3/uL (ref 1.7–7.7)
Neutrophils Relative %: 49 %
Platelets: 183 10*3/uL (ref 150–400)
RBC: 4.41 MIL/uL (ref 4.22–5.81)
RDW: 12.6 % (ref 11.5–15.5)
WBC: 7.7 10*3/uL (ref 4.0–10.5)
nRBC: 0 % (ref 0.0–0.2)

## 2020-03-12 LAB — CREATININE, URINE, RANDOM: Creatinine, Urine: 72.01 mg/dL

## 2020-03-12 LAB — OSMOLALITY: Osmolality: 297 mOsm/kg — ABNORMAL HIGH (ref 275–295)

## 2020-03-12 LAB — PHOSPHORUS: Phosphorus: 2.7 mg/dL (ref 2.5–4.6)

## 2020-03-12 LAB — TSH: TSH: 0.647 u[IU]/mL (ref 0.350–4.500)

## 2020-03-12 LAB — GLUCOSE, CAPILLARY: Glucose-Capillary: 256 mg/dL — ABNORMAL HIGH (ref 70–99)

## 2020-03-12 LAB — MAGNESIUM: Magnesium: 2.3 mg/dL (ref 1.7–2.4)

## 2020-03-12 LAB — SODIUM, URINE, RANDOM: Sodium, Ur: 38 mmol/L

## 2020-03-12 MED ORDER — INSULIN ASPART 100 UNIT/ML ~~LOC~~ SOLN
0.0000 [IU] | Freq: Three times a day (TID) | SUBCUTANEOUS | Status: DC
  Administered 2020-03-12: 2 [IU] via SUBCUTANEOUS
  Administered 2020-03-12: 5 [IU] via SUBCUTANEOUS
  Filled 2020-03-12: qty 0.09

## 2020-03-12 MED ORDER — TAMSULOSIN HCL 0.4 MG PO CAPS: 0.4000 mg | ORAL_CAPSULE | Freq: Every day | ORAL | Status: DC

## 2020-03-12 MED ORDER — SODIUM CHLORIDE 0.9 % IV SOLN: INTRAVENOUS | Status: DC

## 2020-03-12 MED ORDER — INSULIN REGULAR(HUMAN) IN NACL 100-0.9 UT/100ML-% IV SOLN
INTRAVENOUS | Status: DC
  Administered 2020-03-12: 3.4 [IU]/h via INTRAVENOUS

## 2020-03-12 MED ORDER — ONDANSETRON HCL 4 MG PO TABS: 4.0000 mg | ORAL_TABLET | Freq: Four times a day (QID) | ORAL | Status: DC | PRN

## 2020-03-12 MED ORDER — HYDROCODONE-ACETAMINOPHEN 5-325 MG PO TABS: 1.0000 | ORAL_TABLET | ORAL | Status: DC | PRN

## 2020-03-12 MED ORDER — ACETAMINOPHEN 650 MG RE SUPP: 650.0000 mg | Freq: Four times a day (QID) | RECTAL | Status: DC | PRN

## 2020-03-12 MED ORDER — INSULIN ASPART 100 UNIT/ML ~~LOC~~ SOLN
0.0000 [IU] | Freq: Every day | SUBCUTANEOUS | Status: DC
  Filled 2020-03-12: qty 0.05

## 2020-03-12 MED ORDER — ALBUTEROL SULFATE HFA 108 (90 BASE) MCG/ACT IN AERS: 2.0000 | INHALATION_SPRAY | Freq: Four times a day (QID) | RESPIRATORY_TRACT | Status: DC | PRN

## 2020-03-12 MED ORDER — ONDANSETRON HCL 4 MG/2ML IJ SOLN: 4.0000 mg | Freq: Four times a day (QID) | INTRAMUSCULAR | Status: DC | PRN

## 2020-03-12 MED ORDER — AMOXICILLIN-POT CLAVULANATE 875-125 MG PO TABS
1.0000 | ORAL_TABLET | Freq: Two times a day (BID) | ORAL | Status: DC
  Administered 2020-03-12: 1 via ORAL
  Filled 2020-03-12: qty 1

## 2020-03-12 MED ORDER — INSULIN GLARGINE 100 UNIT/ML ~~LOC~~ SOLN
6.0000 [IU] | Freq: Every day | SUBCUTANEOUS | Status: DC
  Administered 2020-03-12: 6 [IU] via SUBCUTANEOUS
  Filled 2020-03-12: qty 0.06

## 2020-03-12 MED ORDER — ACETAMINOPHEN 325 MG PO TABS: 650.0000 mg | ORAL_TABLET | Freq: Four times a day (QID) | ORAL | Status: DC | PRN

## 2020-03-12 MED ORDER — ENOXAPARIN SODIUM 30 MG/0.3ML ~~LOC~~ SOLN
30.0000 mg | SUBCUTANEOUS | Status: DC
  Administered 2020-03-12: 30 mg via SUBCUTANEOUS
  Filled 2020-03-12: qty 0.3

## 2020-03-12 MED ORDER — HYDRALAZINE HCL 50 MG PO TABS
75.0000 mg | ORAL_TABLET | Freq: Four times a day (QID) | ORAL | Status: DC
  Administered 2020-03-12 (×2): 75 mg via ORAL
  Filled 2020-03-12: qty 3
  Filled 2020-03-12: qty 1

## 2020-03-12 MED ORDER — TAMSULOSIN HCL 0.4 MG PO CAPS: 0.4000 mg | ORAL_CAPSULE | Freq: Every day | ORAL | 0 refills | Status: AC

## 2020-03-12 MED ORDER — DEXTROSE-NACL 5-0.45 % IV SOLN: INTRAVENOUS | Status: DC

## 2020-03-12 MED ORDER — AMLODIPINE BESYLATE 5 MG PO TABS
5.0000 mg | ORAL_TABLET | Freq: Every day | ORAL | 1 refills | Status: AC
Start: 2020-03-12 — End: 2021-08-11

## 2020-03-12 MED ORDER — SODIUM CHLORIDE 0.9% FLUSH
3.0000 mL | Freq: Two times a day (BID) | INTRAVENOUS | Status: DC
  Administered 2020-03-12 (×2): 3 mL via INTRAVENOUS

## 2020-03-12 MED ORDER — DEXTROSE 50 % IV SOLN: 0.0000 mL | INTRAVENOUS | Status: DC | PRN

## 2020-03-12 MED ORDER — DOCUSATE SODIUM 100 MG PO CAPS
100.0000 mg | ORAL_CAPSULE | Freq: Two times a day (BID) | ORAL | Status: DC
  Administered 2020-03-12: 100 mg via ORAL
  Filled 2020-03-12: qty 1

## 2020-03-12 NOTE — Progress Notes (Signed)
Initial Nutrition Assessment  DOCUMENTATION CODES:   Non-severe (moderate) malnutrition in context of chronic illness  INTERVENTION:   -Glucerna Shake po TID, each supplement provides 220 kcal and 10 grams of protein -Provide "Carbohydrate Counting" handout from [employer]  NUTRITION DIAGNOSIS:   Moderate Malnutrition related to chronic illness (diabetes) as evidenced by moderate fat depletion, moderate muscle depletion.  GOAL:   Patient will meet greater than or equal to 90% of their needs  MONITOR:   PO intake, Supplement acceptance, Labs, Weight trends, I & O's  REASON FOR ASSESSMENT:   Consult Assessment of nutrition requirement/status  ASSESSMENT:   79 y.o. male with medical history significant of diabetes mellitus type 2, gout, HLD, HTN, obesity, prostate cancer history of diabetic ankle ulcer.,  Anemia, history of stroke  Patient in room, states he did eat some grits, eggs and sausage for breakfast this morning. Pt had not ordered lunch yet. Pt states he sometimes skips meals at home but tries to eat 3 meals a day. States his daughter helps him at home. Pt denies any issues swallowing or chewing at this time. Pt states PTA he was consuming ~2 cups of fruit cocktail daily. Pt states he mainly drinks water at home.  RD reviewed DM coordinator notes and pt reported to her that he consumed sweets at night.  Provided "Carbohydrate Counting" handout and reviewed carbohydrate basics with patient.   Per weight records, pt has lost 8 lbs since July 2020. Insignificant for time frame.  Medications: [facility] Labs reviewed: CBGs: 207-256  NUTRITION - FOCUSED PHYSICAL EXAM:    Most Recent Value  Orbital Region No depletion  Upper Arm Region Moderate depletion  Thoracic and Lumbar Region Unable to assess  Buccal Region Mild depletion  Temple Region Mild depletion  Clavicle Bone Region Moderate depletion  Clavicle and Acromion Bone Region Mild  depletion  Scapular Bone Region Mild depletion  Dorsal Hand Moderate depletion  Patellar Region Moderate depletion  Anterior Thigh Region Mild depletion  Posterior Calf Region Moderate depletion  Edema (RD Assessment) None  Hair Reviewed  Eyes Reviewed  Mouth Reviewed  Skin Reviewed  Nails Reviewed       Diet Order:   Diet Order            Diet Carb Modified Fluid consistency: Thin; Room service appropriate? Yes  Diet effective now                 EDUCATION NEEDS:   Education needs have been addressed  Skin:  Skin Assessment: Reviewed RN Assessment  Last BM:  7/12  Height:   Ht Readings from Last 1 Encounters:  03/11/20 6\' 2"  (1.88 m)    Weight:   Wt Readings from Last 1 Encounters:  03/11/20 81.6 kg    BMI:  Body mass index is 23.11 kg/m.  Estimated Nutritional Needs:   Kcal:  2000-2200  Protein:  85-95g  Fluid:  2L/day  Clayton Bibles, MS, RD, LDN Inpatient Clinical Dietitian Contact information available via Amion

## 2020-03-12 NOTE — Consult Note (Signed)
Cardiology Consultation:   Patient ID: Eric Wade MRN: [id number]; DOB: Jul 12, 1941  Admit date: 03/11/2020 Date of Consult: 03/12/2020  Primary Care Provider: [name], MD [facility] Cardiologist: Fransico Him, MD [facility] Electrophysiologist:  None    Patient Profile:   Eric Wade is a 79 y.o. male with a hx of HTN, HLD, DM2, obesity, dysphagia, and prostate cancer  who is being seen today for the evaluation of bradycardia at the request of D.r Renne Crigler.  History of Present Illness:   Eric Wade has a history of DM.  He was hospitalized 12/28/18-01/03/19 after being found unresponsive - admitted for concern for stroke, found to have metabolic encephalopathy felt to be related to DM. He required intubation to protect airway. Brain MRI negative for acute abnormalities, but ammonia was elevated with normal LFTs. A1c at that time was 13.6%. he was started on insulin. Echo at that time revealed normal EF and no valvular disease.    Yesterday, 03/11/20, he reportedly had a high BG reading at home. He took 11U insulin and called EMS. On arrival, he was given 500 cc NS. Pt daughter reports BG readings in the 400-500s last week with poor PO intake. He was admitted for hyperglycemia/HHS. BG on arrival was greater than 600.  sCr 2.23 K 4.5 CK 38 HS troponin 12 --> 12 Ammonia 25 A1c 13.3%  He was noted to have a systolic murmur and bradycardia, cardiology was consulted. On my interview, he denies anginal symptoms, SOB, lower extremity swelling, and syncope. He has not cardiac complaints.   Past Medical History:  Diagnosis Date  . Allergic rhinitis 08/25/2016  . DIABETES MELLITUS, TYPE II 05/08/2007  . ERECTILE DYSFUNCTION 05/08/2007  . GOUT 07/01/2007  . HYPERLIPIDEMIA 05/08/2007  . HYPERTENSION 05/08/2007  . OBESITY 05/08/2007  . Preventative health care 12/28/2010  . Prostate cancer ([place]) 07/02/2008   Qualifier: Diagnosis of  By: Jenny Reichmann MD, Hunt Oris   . PSA, INCREASED  07/02/2008  . RENAL INSUFFICIENCY 07/01/2007  . Seasonal allergies 12/31/2018    History reviewed. No pertinent surgical history.   Home Medications:  Prior to Admission medications   Medication Sig Start Date End Date Taking? Authorizing Provider  albuterol (VENTOLIN HFA) 108 (90 Base) MCG/ACT inhaler Inhale 2 puffs into the lungs every 6 (six) hours as needed for wheezing or shortness of breath.  12/01/19  Yes [provider]  atorvastatin (LIPITOR) 20 MG tablet TAKE 1 TABLET (20 MG TOTAL) BY MOUTH DAILY AT 6 PM. Patient taking differently: Take 20 mg by mouth in the morning and at bedtime.  02/02/20  Yes [name], MD  diltiazem (DILT-XR) 240 MG 24 hr capsule Take 1 capsule (240 mg total) by mouth daily. 10/23/19  Yes [name], MD  furosemide (LASIX) 40 MG tablet Take 40 mg by mouth daily.  01/18/20  Yes [provider]  hydrALAZINE (APRESOLINE) 50 MG tablet Take 1.5 tablets (75 mg total) by mouth every 6 (six) hours. 10/23/19  Yes [name], MD  insulin NPH Human (NOVOLIN N) 100 UNIT/ML injection Inject 0.12 mLs (12 Units total) into the skin daily before breakfast. Patient taking differently: Inject 0-12 Units into the skin in the morning, at noon, and at bedtime.  02/24/19  Yes Swayze, Ava, DO  LANTUS 100 UNIT/ML injection Inject 6 Units into the skin at bedtime.  11/28/19  Yes [provider]  tamsulosin (FLOMAX) 0.4 MG CAPS capsule Take 0.4 mg by mouth at bedtime.  02/06/19  Yes [provider]  Accu-Chek FastClix Lancets MISC USE TO CHECK BLOOD SUGAR UP TO 4 TIMES A DAY (E11.9) 10/07/19   [name], MD  aspirin 81 MG EC tablet Take 81 mg by mouth every evening.  Patient not taking: Reported on 03/11/2020    [provider]  blood glucose meter kit and supplies KIT Pt receives from [facility] 01/03/19   Florencia Reasons, MD  blood glucose meter kit and supplies KIT Dispense based on patient and insurance preference. Use up to four times daily as  directed. (FOR ICD-9 250.00, 250.01). 01/03/19   Florencia Reasons, MD  blood glucose meter kit and supplies Use four times daily as directed E11.9 02/15/19   [name], MD  dorzolamide-timolol (COSOPT) 22.3-6.8 MG/ML ophthalmic solution Place 1 drop into both eyes daily.  Patient not taking: Reported on 03/11/2020 03/01/20   [provider]  glucose blood (ACCU-CHEK GUIDE) test strip Use to check blood sugar up to 4 times a day. 03/23/19   [name], MD  insulin NPH Human (NOVOLIN N) 100 UNIT/ML injection Inject 0.06 mLs (6 Units total) into the skin at bedtime. Patient not taking: Reported on 03/11/2020 02/23/19   Swayze, Ava, DO  insulin NPH-regular Human (70-30) 100 UNIT/ML injection Inject into the skin. Patient not taking: Reported on 03/11/2020    [provider]  isosorbide mononitrate (IMDUR) 30 MG 24 hr tablet TAKE 1 TABLET BY MOUTH EVERY DAY Patient not taking: Reported on 03/11/2020 06/08/19   [name], MD  lactulose (CHRONULAC) 10 GM/15ML solution Take 15 mLs (10 g total) by mouth daily. Patient not taking: Reported on 03/11/2020 01/03/19   Florencia Reasons, MD  Lancet Devices (AUTO-LANCET) MISC Pt received from [facility] 01/10/15   [name], MD  lisinopril (ZESTRIL) 20 MG tablet Take 1 tablet (20 mg total) by mouth daily. Must keep May 12th appt for future refills Patient not taking: Reported on 03/11/2020 01/05/19   [name], MD  Maltodextrin-Xanthan Gum ([place]) POWD For nectar thick liquid Patient not taking: Reported on 03/11/2020 01/03/19   Florencia Reasons, MD  terbinafine (LAMISIL) 250 MG tablet Take 250 mg by mouth daily. Patient not taking: Reported on 03/11/2020 01/19/20   [provider]  traZODone (DESYREL) 100 MG tablet  12/25/19   [provider]    Inpatient Medications: Scheduled Meds: . amoxicillin-clavulanate  1 tablet Oral Q12H  . docusate sodium  100 mg Oral BID  . enoxaparin (LOVENOX) injection  30 mg Subcutaneous Q24H  .  hydrALAZINE  75 mg Oral Q6H  . insulin aspart  0-5 Units Subcutaneous QHS  . insulin aspart  0-9 Units Subcutaneous TID WC  . insulin glargine  6 Units Subcutaneous Daily  . sodium chloride flush  3 mL Intravenous Q12H  . tamsulosin  0.4 mg Oral QHS   Continuous Infusions:  PRN Meds: acetaminophen **OR** acetaminophen, albuterol, dextrose, dextrose, HYDROcodone-acetaminophen, ondansetron **OR** ondansetron (ZOFRAN) IV  Allergies:    Allergies  Allergen Reactions  . Nsaids     REACTION: renal insufficiency    Social History:   Social History   Socioeconomic History  . Marital status: Married    Spouse name: Not on file  . Number of children: 6  . Years of education: Not on file  . Highest education level: Not on file  Occupational History  . Occupation: retired since 2005 [employer] [place]: RETIRED  Tobacco Use  .  Smoking status: Former Research scientist (life sciences)  . Smokeless tobacco: Never Used  Vaping Use  . Vaping Use: Never used  Substance and Sexual Activity  . Alcohol use: No  . Drug use: No  . Sexual activity: Not Currently    Partners: Female    Birth control/protection: None  Other Topics Concern  . Not on file  Social History Narrative   1 child died homicide in [place] 08/25/23   Social Determinants of Health   Financial Resource Strain:   . Difficulty of Paying Living Expenses:   Food Insecurity:   . Worried About Charity fundraiser in the Last Year:   . Arboriculturist in the Last Year:   Transportation Needs:   . Film/video editor (Medical):   [name] Lack of Transportation (Non-Medical):   Physical Activity:   . Days of Exercise per Week:   . Minutes of Exercise per Session:   Stress:   . Feeling of Stress :   Social Connections:   . Frequency of Communication with Friends and Family:   . Frequency of Social Gatherings with Friends and Family:   . Attends Religious Services:   . Active Member of Clubs or Organizations:   . Attends Theatre manager Meetings:   [name] Marital Status:   Intimate Partner Violence:   . Fear of Current or Ex-Partner:   . Emotionally Abused:   [name] Physically Abused:   . Sexually Abused:     Family History:    Family History  Problem Relation Age of Onset  . Hypertension Other      ROS:  Please see the history of present illness.   All other ROS reviewed and negative.     Physical Exam/Data:   Vitals:   03/12/20 0800 03/12/20 0821 03/12/20 0902 03/12/20 1018  BP: (!) 146/66 (!) 156/65 (!) 156/65 (!) 141/66  Pulse: (!) 57 (!) 59 60 62  Resp: '17 18 18 18  ' Temp:  98.5 F (36.9 C) 98.5 F (36.9 C) 98.5 F (36.9 C)  TempSrc:  Oral Oral Oral  SpO2: 97% 97% 96% 95%  Weight:      Height:        Intake/Output Summary (Last 24 hours) at 03/12/2020 1051 Last data filed at 03/12/2020 0310 Gross per 24 hour  Intake 2349.74 ml  Output --  Net 2349.74 ml   Last 3 Weights 03/11/2020 03/02/2019 02/20/2019  Weight (lbs) 180 lb 189 lb 179 lb 0.2 oz  Weight (kg) 81.647 kg 85.73 kg 81.2 kg     Body mass index is 23.11 kg/m.  General:  Elderly male in NAD HEENT: normal Neck: no JVD Vascular: No carotid bruits Cardiac:  normal S1, S2; RRR, soft systolic murmur Lungs:  clear to auscultation bilaterally, no wheezing, rhonchi or rales  Abd: soft, nontender, no hepatomegaly  Ext: no edema Musculoskeletal:  No deformities, BUE and BLE strength normal and equal Skin: warm and dry  Neuro:  CNs 2-12 intact, no focal abnormalities noted Psych:  Normal affect   EKG:  The EKG was personally reviewed and demonstrates:  Sinus bradycardia HR 50 Telemetry:  Telemetry was personally reviewed and demonstrates:  Sinus to sinus bradycardia - rates in the 38-40s at night during sleep, HR 50-70s during waking hours  Relevant CV Studies:  Echo 03/12/20: pending   Echo 12/29/18: 1. The left ventricle has normal systolic function with an ejection  fraction of 60-65%. The cavity size was normal. Left  ventricular diastolic  Doppler parameters are consistent with impaired relaxation.  2. The right ventricle has normal systolic function. The cavity was  normal. There is no increase in right ventricular wall thickness.  3. No stenosis of the aortic valve.  4. The interatrial septum was not assessed.    Laboratory Data:  High Sensitivity Troponin:   Recent Labs  Lab 03/11/20 1950 03/11/20 2130  TROPONINIHS 12 12     Chemistry Recent Labs  Lab 03/11/20 1618 03/12/20 0500 03/12/20 0825  NA 131* 145 144  K 4.5 3.1* 3.5  CL 95* 112* 107  CO2 '22 27 23  ' GLUCOSE 623* 114* 162*  BUN 38* 26* 24*  CREATININE 2.23* 1.58* 1.49*  CALCIUM 9.2 8.6* 9.1  GFRNONAA 27* 40* 43*  GFRAA 31* 47* 50*  ANIONGAP '14 6 14    ' Recent Labs  Lab 03/11/20 1618 03/12/20 0500  PROT 7.5 7.0  ALBUMIN 3.8 3.3*  AST 17 18  ALT 15 13  ALKPHOS 87 60  BILITOT 0.7 0.7   Hematology Recent Labs  Lab 03/11/20 1618 03/12/20 0500  WBC 8.6 7.7  RBC 4.59 4.41  HGB 14.0 13.3  HCT 40.6 39.6  MCV 88.5 89.8  MCH 30.5 30.2  MCHC 34.5 33.6  RDW 12.4 12.6  PLT 204 183   BNPNo results for input(s): BNP, PROBNP in the last 168 hours.  DDimer No results for input(s): DDIMER in the last 168 hours.   Radiology/Studies:  DG Chest Portable 1 View  Result Date: 03/11/2020 CLINICAL DATA:  Hyperglycemia, hypertension, diabetes, tobacco abuse EXAM: PORTABLE CHEST 1 VIEW COMPARISON:  02/20/2019 FINDINGS: Single frontal view of the chest demonstrates an unremarkable cardiac silhouette. No airspace disease, effusion, or pneumothorax. No acute bony abnormalities. IMPRESSION: 1. No acute intrathoracic process. Electronically Signed   By: Randa Ngo M.D.   On: 03/11/2020 17:19   {  Assessment and Plan:   Sinus bradycardia - patient was sinus bradycardia during sleeping hours, in 50-70s during waking hours - will change cardizem to amlodipine as below   Systolic murmur - soft systolic murmur on exam -  he is not fluid overloaded - will obtain echo in the outpatient setting   Hypertension CKD stage III DM 2 - he is on cardizem at baseline for HTN - given his bradycardia, will switch this to amlodipine for now - DM + CKD - may consider cardiorenal protection with ACEI/ARB once BMP deemed stable   OK to discharge after Dr. Radford Pax sees.  I will arrange OP echo.      For questions or updates, please contact [place] Please consult [email or web address] for contact info under    Signed, Ledora Bottcher, PA  03/12/2020 10:51 AM

## 2020-03-12 NOTE — Evaluation (Signed)
Physical Therapy Evaluation Patient Details Name: Eric Wade MRN: [id number] DOB: Jun 14, 1941 Today's Date: 03/12/2020   History of Present Illness  Eric Wade is a 79 y.o. male with medical history significant of diabetes mellitus type 2, gout, HLD, HTN, obesity, prostate cancer history of diabetic ankle ulcer, anemia, and history of stroke. Pt called EMS due to hyperglyceamia. On their arrival he was given a 500 mL normal saline CBG was rechecked and was 588.  He was brought into [facility]. Patient endorsing overall feeling weak no chest pain or shortness of breath no nausea no vomiting no abdominal pain. Patient's daughter stated that his blood sugar has been elevated for the past 1 week stay between 400-500.  He has been using his cane more frequently been feeling more fatigued.    Clinical Impression  Eric Wade is 79 y.o. male admitted with above HPI and diagnosis. Patient is currently limited by functional impairments below (see PT problem list). Patient lives with his daughter and is modified independent with SPC/RW for mobility at baseline. Patient will benefit from continued skilled PT interventions to address impairments and progress independence with mobility, recommending OPPT to address functional weakness and balance impairments to prevent falls and maintain independence. Recommended/educated patient on benefit of use of RW for mobility to improve dynamic balance. Acute PT will follow and progress as able.      Follow Up Recommendations Outpatient PT    Equipment Recommendations  None recommended by PT    Recommendations for Other Services       Precautions / Restrictions Precautions Precautions: Fall Precaution Comments: denies falls in  6 months Restrictions Weight Bearing Restrictions: No      Mobility  Bed Mobility Overal bed mobility: Modified Independent           Transfers Overall transfer level: Needs assistance Equipment used: 1  person hand held assist;None Transfers: Sit to/from Stand Sit to Stand: Min guard;Min assist         General transfer comment: pt required light assist and min guard at times for sit<>stand transfer from EOB and recliner. pt required use of UE's for power up or use of momentum.  Ambulation/Gait Ambulation/Gait assistance: Min guard;Min assist Gait Distance (Feet): 170 Feet Assistive device: None Gait Pattern/deviations: Step-through pattern;Decreased stride length;Trunk flexed Gait velocity: fair   General Gait Details: pt with flexed posture requiring cues to correct. pt with 3x LOB by catching toe on groung (both LE's) and min assist provided to prevent regain stability. He has flexed hips/knees and poor foot clearance throughout. Educated pt on use of RW to improve safety with mobility.  Stairs       Wheelchair Mobility    Modified Rankin (Stroke Patients Only)       Balance Overall balance assessment: Needs assistance Sitting-balance support: Feet supported Sitting balance-Leahy Scale: Good     Standing balance support: During functional activity;No upper extremity supported Standing balance-Leahy Scale: Good Standing balance comment: 3 episodes of LOB in gait, pt started to correct himself and min assist provided.          Pertinent Vitals/Pain Pain Assessment: No/denies pain    Home Living Family/patient expects to be discharged to:: Private residence Living Arrangements: Children Available Help at Discharge: Family Type of Home: House Home Access: Stairs to enter Entrance Stairs-Rails: None Entrance Stairs-Number of Steps: 2 Home Layout: One level Home Equipment: Environmental consultant - 2 wheels;Cane - single point Additional Comments: pt reports he is currently living with  his daughter but sometimes is alone.     Prior Function Level of Independence: Independent with assistive device(s)         Comments: pt reports use of SPC and RW intermittently to mobilize  at home. He does mobilize with no device occasionally.     Hand Dominance   Dominant Hand: Right    Extremity/Trunk Assessment   Upper Extremity Assessment Upper Extremity Assessment: Overall WFL for tasks assessed    Lower Extremity Assessment Lower Extremity Assessment: Overall WFL for tasks assessed    Cervical / Trunk Assessment Cervical / Trunk Assessment: Normal  Communication   Communication: No difficulties  Cognition Arousal/Alertness: Awake/alert Behavior During Therapy: WFL for tasks assessed/performed Overall Cognitive Status: Within Functional Limits for tasks assessed           General Comments General comments (skin integrity, edema, etc.): 5x Sit<>Stand Test: 35.77 seconds with use of UE's and momentum to rise.    Exercises     Assessment/Plan    PT Assessment Patient needs continued PT services  PT Problem List Decreased strength;Decreased activity tolerance;Decreased balance;Decreased mobility;Decreased knowledge of use of DME       PT Treatment Interventions DME instruction;Gait training;Stair training;Functional mobility training;Therapeutic activities;Therapeutic exercise;Balance training;Patient/family education    PT Goals (Current goals can be found in the Care Plan section)  Acute Rehab PT Goals Patient Stated Goal: get stronger back home to independence PT Goal Formulation: With patient Time For Goal Achievement: 03/19/20 Potential to Achieve Goals: Good    Frequency Min 3X/week    AM-PAC PT "6 Clicks" Mobility  Outcome Measure Help needed turning from your back to your side while in a flat bed without using bedrails?: None Help needed moving from lying on your back to sitting on the side of a flat bed without using bedrails?: None Help needed moving to and from a bed to a chair (including a wheelchair)?: A Little Help needed standing up from a chair using your arms (e.g., wheelchair or bedside chair)?: A Little Help needed to walk  in hospital room?: A Little Help needed climbing 3-5 steps with a railing? : A Little 6 Click Score: 20    End of Session Equipment Utilized During Treatment: Gait belt Activity Tolerance: Patient tolerated treatment well Patient left: with call bell/phone within reach;in chair;with chair alarm set Nurse Communication: Mobility status PT Visit Diagnosis: Muscle weakness (generalized) (M62.81);Difficulty in walking, not elsewhere classified (R26.2);Other abnormalities of gait and mobility (R26.89);Unsteadiness on feet (R26.81)    Time: 0737-1062 PT Time Calculation (min) (ACUTE ONLY): 23 min   Charges:   PT Evaluation $PT Eval Low Complexity: 1 Low PT Treatments $Gait Training: 8-22 mins       Verner Mould, DPT Acute Rehabilitation Services  Office [phone] Pager [phone]  03/12/2020 2:06 PM

## 2020-03-12 NOTE — Telephone Encounter (Signed)
Left message for patient to call back and schedule echocardiogram and appointment.

## 2020-03-12 NOTE — ED Notes (Signed)
Report given to Hannah,RN

## 2020-03-12 NOTE — Progress Notes (Signed)
Inpatient Diabetes Program Recommendations  AACE/ADA: New Consensus Statement on Inpatient Glycemic Control (2015)  Target Ranges:  Prepandial:   less than 140 mg/dL      Peak postprandial:   less than 180 mg/dL (1-2 hours)      Critically ill patients:  140 - 180 mg/dL   Lab Results  Component Value Date   GLUCAP 256 (H) 03/12/2020   HGBA1C 13.3 (H) 03/12/2020    Review of Glycemic Control  Diabetes history: DM 2 Outpatient Diabetes medications: insulin, regular and 70/30 at night time Current orders for Inpatient glycemic control: Lantus 6 units and Nov 0-9 units tid + hs  A1c 13.3%  Spoke with pt at bedside regarding A1c level and DM control at home. Pt reports being naive to dietary changes, eating multiple fruits cups, regular beverages and eating several sweet snack throughout the night. Daughter reporting to attending MD that she has a hard time controlling glucose levels at home especially being high first thing in the am resulting in them using 70/30 at night to lower glucose levels.  Educated patient extensively on eating only 2 fruit cups a day draining the juice. Portion out the rest of his food into balance diet including vegetable and protein choices. Encouraged protein snack at bedtime to prevent hunger at night time.   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  Told patient to check glucose first thing in the morning ad alternating a second check during the day either before the meal or at bedtime. Pt reports having a glucometer at home.  Thanks, Tama Headings RN, MSN, BC-ADM Inpatient Diabetes Coordinator Team Pager [phone] (8a-5p)

## 2020-03-12 NOTE — Telephone Encounter (Signed)
-----   Message from Antonieta Iba, RN sent at 03/12/2020 12:13 PM EDT ----- Pt  needs to be scheduled for an outpatient echo for murmur and then follow up with Dr. Theodosia Blender team after echo.  Discharging today.  Thanks Angie

## 2020-03-12 NOTE — Discharge Summary (Signed)
Physician Discharge Summary  Eric Wade PTE:[id number] DOB: 03-12-1941 DOA: 03/11/2020  PCP: [name], MD  Admit date: 03/11/2020 Discharge date: 03/12/2020  Admitted From: home Disposition:  home  Recommendations for Outpatient Follow-up:  1. Follow up with PCP in 1-2 weeks 2. Please obtain BMP/CBC in one week  Home Health: none Equipment/Devices: none  Discharge Condition: stable CODE STATUS: Full code Diet recommendation: diabetic   HPI: Per admitting MD, Eric Wade is a 79 y.o. male with medical history significant of diabetes mellitus type 2, gout, HLD, HTN, obesity, prostate cancer history of diabetic ankle ulcer.,  Anemia, history of stroke Presented with initial blood sugar today read high patient gave himself 11 units of insulin and called EMS.  On their arrival he was given a 500 mL normal saline CBG was rechecked and was 588.  He was brought into [facility]. Patient endorsing overall feeling weak no chest pain or shortness of breath no nausea no vomiting no abdominal pain. His daughter stated that his blood sugar has been elevated for the past 1 week stay between 400-500.  He has been using his cane more frequently been feeling more fatigued.  Per daughter he has been eating a lot more fruit cups lately and she thought that that was causing his blood sugar to go up  Hospital Course / Discharge diagnoses: Principal problem: Hyperglycemia/HHS-patient was admitted to the hospital and placed on insulin infusion.  This is likely in the setting of dietary noncompliance.-Discussed with the patient as well as with the patient's daughter over the phone, he appears to eat several servings of sugary fruit cups, apple juice as well as Dr. Malachi Bonds.  She states that his sugars run in the 3-400s at home on a regular basis.  Patient is not sure what insulin he is taking and the latter is managing his insulin regimen.  His CBGs improved, much better, patient feels at  baseline asking to go home.  He will be discharged in stable condition, patient and daughter strongly advised for dietary changes.  Diabetes coordinator also consulted and discussed with the family.  For now I would continue the same insulin regimen on discharge and as long as he will make dietary changes his CBGs should improve over time.  Active problems: Acute kidney injury on chronic renal disease stage IIIa, baseline creatinine around 1.4-1.5, creatinine on presentation was 2.2 and improved back to baseline at 1.5 upon discharge. Essential hypertension-resume home medications, change diltiazem to amlodipine due to bradycardia Hyperlipidemia-stable, continue home medications Prostate cancer-follow-up as an outpatient Bradycardia-diltiazem on hold as above, heart rate has been stable in the 70s   Discharge Instructions   Allergies as of 03/12/2020      Reactions   Nsaids    REACTION: renal insufficiency      Medication List    STOP taking these medications   aspirin 81 MG EC tablet   diltiazem 240 MG 24 hr capsule Commonly known as: Dilt-XR   dorzolamide-timolol 22.3-6.8 MG/ML ophthalmic solution Commonly known as: COSOPT   isosorbide mononitrate 30 MG 24 hr tablet Commonly known as: IMDUR   lactulose 10 GM/15ML solution Commonly known as: CHRONULAC   Lantus 100 UNIT/ML injection Generic drug: insulin glargine   lisinopril 20 MG tablet Commonly known as: ZESTRIL   Resource ThickenUp Clear Powd   terbinafine 250 MG tablet Commonly known as: LAMISIL   traZODone 100 MG tablet Commonly known as: DESYREL     TAKE these medications  Accu-Chek FastClix Lancets Misc USE TO CHECK BLOOD SUGAR UP TO 4 TIMES A DAY (E11.9)   Accu-Chek Guide test strip Generic drug: glucose blood Use to check blood sugar up to 4 times a day.   albuterol 108 (90 Base) MCG/ACT inhaler Commonly known as: VENTOLIN HFA Inhale 2 puffs into the lungs every 6 (six) hours as needed for  wheezing or shortness of breath.   amLODipine 5 MG tablet Commonly known as: NORVASC Take 1 tablet (5 mg total) by mouth daily.   atorvastatin 20 MG tablet Commonly known as: LIPITOR TAKE 1 TABLET (20 MG TOTAL) BY MOUTH DAILY AT 6 PM. What changed: when to take this   Auto-Lancet Misc Pt received from [facility]   blood glucose meter kit and supplies Use four times daily as directed E11.9   blood glucose meter kit and supplies Kit Pt receives from [employer]   blood glucose meter kit and supplies Kit Dispense based on patient and insurance preference. Use up to four times daily as directed. (FOR ICD-9 250.00, 250.01).   furosemide 40 MG tablet Commonly known as: LASIX Take 40 mg by mouth daily.   hydrALAZINE 50 MG tablet Commonly known as: APRESOLINE Take 1.5 tablets (75 mg total) by mouth every 6 (six) hours.   insulin NPH Human 100 UNIT/ML injection Commonly known as: NOVOLIN N Inject 0.12 mLs (12 Units total) into the skin daily before breakfast. What changed:   how much to take  when to take this  Another medication with the same name was removed. Continue taking this medication, and follow the directions you see here.   insulin NPH-regular Human (70-30) 100 UNIT/ML injection Inject into the skin.   tamsulosin 0.4 MG Caps capsule Commonly known as: FLOMAX Take 1 capsule (0.4 mg total) by mouth at bedtime.       Follow-up Information    [name], MD. Schedule an appointment as soon as possible for a visit in 1 week(s).   Specialties: Internal Medicine, Radiology Contact information: [place] [address] [phone]        [name], MD .   Specialty: Cardiology Contact information: [phone] [address] [place] [address] [phone]               Consultations:  Cardiology   Procedures/Studies:  DG Chest Portable 1 View  Result Date: 03/11/2020 CLINICAL DATA:  Hyperglycemia, hypertension,  diabetes, tobacco abuse EXAM: PORTABLE CHEST 1 VIEW COMPARISON:  02/20/2019 FINDINGS: Single frontal view of the chest demonstrates an unremarkable cardiac silhouette. No airspace disease, effusion, or pneumothorax. No acute bony abnormalities. IMPRESSION: 1. No acute intrathoracic process. Electronically Signed   By: Randa Ngo M.D.   On: 03/11/2020 17:19     Subjective: - no chest pain, shortness of breath, no abdominal pain, nausea or vomiting.   Discharge Exam: BP (!) 153/68 (BP Location: Right Arm) Comment: Vinnie Langton, & MD notified of the vitals in room   Pulse 74   Temp 98.2 F (36.8 C) (Oral)   Resp 16   Ht '6\' 2"'  (1.88 m)   Wt 81.6 kg   SpO2 96%   BMI 23.11 kg/m   General: Pt is alert, awake, not in acute distress Cardiovascular: RRR, S1/S2 +, no rubs, no gallops Respiratory: CTA bilaterally, no wheezing, no rhonchi Abdominal: Soft, NT, ND, bowel sounds + Extremities: no edema, no cyanosis    The results of significant diagnostics from this hospitalization (including imaging,  microbiology, ancillary and laboratory) are listed below for reference.     Microbiology: Recent Results (from the past 240 hour(s))  SARS Coronavirus 2 by RT PCR (hospital order, performed in [facility] hospital lab) Nasopharyngeal Nasopharyngeal Swab     Status: None   Collection Time: 03/11/20  5:52 PM   Specimen: Nasopharyngeal Swab  Result Value Ref Range Status   SARS Coronavirus 2 NEGATIVE NEGATIVE Final    Comment: (NOTE) SARS-CoV-2 target nucleic acids are NOT DETECTED.  The SARS-CoV-2 RNA is generally detectable in upper and lower respiratory specimens during the acute phase of infection. The lowest concentration of SARS-CoV-2 viral copies this assay can detect is 250 copies / mL. A negative result does not preclude SARS-CoV-2 infection and should not be used as the sole basis for treatment or other patient management decisions.  A negative result may occur  with improper specimen collection / handling, submission of specimen other than nasopharyngeal swab, presence of viral mutation(s) within the areas targeted by this assay, and inadequate number of viral copies (<250 copies / mL). A negative result must be combined with clinical observations, patient history, and epidemiological information.  Fact Sheet for Patients:   StrictlyIdeas.no  Fact Sheet for Healthcare Providers: BankingDealers.co.za  This test is not yet approved or  cleared by the [place] FDA and has been authorized for detection and/or diagnosis of SARS-CoV-2 by FDA under an Emergency Use Authorization (EUA).  This EUA will remain in effect (meaning this test can be used) for the duration of the COVID-19 declaration under Section 564(b)(1) of the Act, 21 U.S.C. section 360bbb-3(b)(1), unless the authorization is terminated or revoked sooner.  Performed at [facility], [place] [address], [place], [place] [address]      Labs: Basic Metabolic Panel: Recent Labs  Lab 03/11/20 1618 03/11/20 2100 03/11/20 2130 03/12/20 0500 03/12/20 0825 03/12/20 1106  NA 131*  --   --  145 144 138  K 4.5  --   --  3.1* 3.5 3.7  CL 95*  --   --  112* 107 106  CO2 22  --   --  '27 23 25  ' GLUCOSE 623*  --   --  114* 162* 266*  BUN 38*  --   --  26* 24* 25*  CREATININE 2.23*  --   --  1.58* 1.49* 1.57*  CALCIUM 9.2  --   --  8.6* 9.1 8.9  MG  --   --  2.5* 2.3  --   --   PHOS  --  2.0*  --  2.7  --   --    Liver Function Tests: Recent Labs  Lab 03/11/20 1618 03/12/20 0500  AST 17 18  ALT 15 13  ALKPHOS 87 60  BILITOT 0.7 0.7  PROT 7.5 7.0  ALBUMIN 3.8 3.3*   CBC: Recent Labs  Lab 03/11/20 1618 03/12/20 0500  WBC 8.6 7.7  NEUTROABS  --  3.8  HGB 14.0 13.3  HCT 40.6 39.6  MCV 88.5 89.8  PLT 204 183   CBG: Recent Labs  Lab 03/12/20 0616 03/12/20 0814 03/12/20 0908 03/12/20 1011  03/12/20 1123  GLUCAP 94 171* 172* 207* 256*   Hgb A1c Recent Labs    03/12/20 0500  HGBA1C 13.3*   Lipid Profile No results for input(s): CHOL, HDL, LDLCALC, TRIG, CHOLHDL, LDLDIRECT in the last 72 hours. Thyroid function studies Recent Labs    03/12/20 0500  TSH 0.647   Urinalysis  Component Value Date/Time   COLORURINE AMBER (A) 03/11/2020 1618   APPEARANCEUR CLEAR 03/11/2020 1618   LABSPEC 1.020 03/11/2020 1618   PHURINE 5.0 03/11/2020 1618   GLUCOSEU >=500 (A) 03/11/2020 1618   GLUCOSEU NEGATIVE 03/11/2017 1553   HGBUR NEGATIVE 03/11/2020 1618   BILIRUBINUR NEGATIVE 03/11/2020 1618   KETONESUR 5 (A) 03/11/2020 1618   PROTEINUR NEGATIVE 03/11/2020 1618   UROBILINOGEN 0.2 03/11/2017 1553   NITRITE NEGATIVE 03/11/2020 1618   LEUKOCYTESUR TRACE (A) 03/11/2020 1618    FURTHER DISCHARGE INSTRUCTIONS:   Get Medicines reviewed and adjusted: Please take all your medications with you for your next visit with your Primary MD   Laboratory/radiological data: Please request your Primary MD to go over all hospital tests and procedure/radiological results at the follow up, please ask your Primary MD to get all Hospital records sent to his/her office.   In some cases, they will be blood work, cultures and biopsy results pending at the time of your discharge. Please request that your primary care M.D. goes through all the records of your hospital data and follows up on these results.   Also Note the following: If you experience worsening of your admission symptoms, develop shortness of breath, life threatening emergency, suicidal or homicidal thoughts you must seek medical attention immediately by calling 911 or calling your MD immediately  if symptoms less severe.   You must read complete instructions/literature along with all the possible adverse reactions/side effects for all the Medicines you take and that have been prescribed to you. Take any new Medicines after you have  completely understood and accpet all the possible adverse reactions/side effects.    Do not drive when taking Pain medications or sleeping medications (Benzodaizepines)   Do not take more than prescribed Pain, Sleep and Anxiety Medications. It is not advisable to combine anxiety,sleep and pain medications without talking with your primary care practitioner   Special Instructions: If you have smoked or chewed Tobacco  in the last 2 yrs please stop smoking, stop any regular Alcohol  and or any Recreational drug use.   Wear Seat belts while driving.   Please note: You were cared for by a hospitalist during your hospital stay. Once you are discharged, your primary care physician will handle any further medical issues. Please note that NO REFILLS for any discharge medications will be authorized once you are discharged, as it is imperative that you return to your primary care physician (or establish a relationship with a primary care physician if you do not have one) for your post hospital discharge needs so that they can reassess your need for medications and monitor your lab values.  Time coordinating discharge: 40 minutes  SIGNED:  Marzetta Board, MD, PhD 03/12/2020, 1:42 PM

## 2020-03-12 NOTE — Discharge Instructions (Signed)
Follow with [name], MD in 5-7 days  Please get a complete blood count and chemistry panel checked by your Primary MD at your next visit, and again as instructed by your Primary MD. Please get your medications reviewed and adjusted by your Primary MD.  Please request your Primary MD to go over all Hospital Tests and Procedure/Radiological results at the follow up, please get all Hospital records sent to your Prim MD by signing hospital release before you go home.  In some cases, there will be blood work, cultures and biopsy results pending at the time of your discharge. Please request that your primary care M.D. goes through all the records of your hospital data and follows up on these results.  If you had Pneumonia of Lung problems at the Hospital: Please get a 2 view Chest X ray done in 6-8 weeks after hospital discharge or sooner if instructed by your Primary MD.  If you have Congestive Heart Failure: Please call your Cardiologist or Primary MD anytime you have any of the following symptoms:  1) 3 pound weight gain in 24 hours or 5 pounds in 1 week  2) shortness of breath, with or without a dry hacking cough  3) swelling in the hands, feet or stomach  4) if you have to sleep on extra pillows at night in order to breathe  Follow cardiac low salt diet and 1.5 lit/day fluid restriction.  If you have diabetes Accuchecks 4 times/day, Once in AM empty stomach and then before each meal. Log in all results and show them to your primary doctor at your next visit. If any glucose reading is under 80 or above 300 call your primary MD immediately.  If you have Seizure/Convulsions/Epilepsy: Please do not drive, operate heavy machinery, participate in activities at heights or participate in high speed sports until you have seen by Primary MD or a Neurologist and advised to do so again. Per [facility] statutes, patients with seizures are not allowed to drive until they have been  seizure-free for six months.  Use caution when using heavy equipment or power tools. Avoid working on ladders or at heights. Take showers instead of baths. Ensure the water temperature is not too high on the home water heater. Do not go swimming alone. Do not lock yourself in a room alone (i.e. bathroom). When caring for infants or small children, sit down when holding, feeding, or changing them to minimize risk of injury to the child in the event you have a seizure. Maintain good sleep hygiene. Avoid alcohol.   If you had Gastrointestinal Bleeding: Please ask your Primary MD to check a complete blood count within one week of discharge or at your next visit. Your endoscopic/colonoscopic biopsies that are pending at the time of discharge, will also need to followed by your Primary MD.  Get Medicines reviewed and adjusted. Please take all your medications with you for your next visit with your Primary MD  Please request your Primary MD to go over all hospital tests and procedure/radiological results at the follow up, please ask your Primary MD to get all Hospital records sent to his/her office.  If you experience worsening of your admission symptoms, develop shortness of breath, life threatening emergency, suicidal or homicidal thoughts you must seek medical attention immediately by calling 911 or calling your MD immediately  if symptoms less severe.  You must read complete instructions/literature along with all the possible adverse reactions/side effects for all the Medicines you  take and that have been prescribed to you. Take any new Medicines after you have completely understood and accpet all the possible adverse reactions/side effects.   Do not drive or operate heavy machinery when taking Pain medications.   Do not take more than prescribed Pain, Sleep and Anxiety Medications  Special Instructions: If you have smoked or chewed Tobacco  in the last 2 yrs please stop smoking, stop any regular  Alcohol  and or any Recreational drug use.  Wear Seat belts while driving.  Please note You were cared for by a hospitalist during your hospital stay. If you have any questions about your discharge medications or the care you received while you were in the hospital after you are discharged, you can call the unit and asked to speak with the hospitalist on call if the hospitalist that took care of you is not available. Once you are discharged, your primary care physician will handle any further medical issues. Please note that NO REFILLS for any discharge medications will be authorized once you are discharged, as it is imperative that you return to your primary care physician (or establish a relationship with a primary care physician if you do not have one) for your aftercare needs so that they can reassess your need for medications and monitor your lab values.  You can reach the hospitalist office at phone [phone] or fax [phone]   If you do not have a primary care physician, you can call [phone] for a physician referral.  Activity: As tolerated with Full fall precautions use walker/cane & assistance as needed    Diet: carb modified  Disposition Home

## 2020-03-12 NOTE — Progress Notes (Signed)
OT Cancellation Note  Patient Details Name: Eric Wade MRN: [id number] DOB: [birth year]   Cancelled Treatment:    Reason Eval/Treat Not Completed: OT screened, no needs identified, will sign off. Spoke with PT reports doing well with mobility, spoke with patient reports no difficulties or concerns regarding self care. Will discontinue order at this time, please re-consult if new needs arise.  Delbert Phenix OT Pager: [phone] 03/12/2020, 1:05 PM

## 2020-03-12 NOTE — Progress Notes (Signed)
Pt ambulatory from stretcher to room 1532. Alert and oriented x 4. Lungs clear bilaterally. Skin intact. No complaints of pain. PIV #20 L AC, flushed with NS, patent. PIV #18, L FA flushed with NS, patent.   1128. Diabetes educator at bedside.

## 2020-03-13 ENCOUNTER — Telehealth: Payer: Self-pay | Admitting: *Deleted

## 2020-03-13 NOTE — Telephone Encounter (Signed)
Tried calling pt to set-up hosp f/u appt there was no answer LMOM RTC.Marland KitchenJohny Wade

## 2020-03-14 NOTE — Telephone Encounter (Signed)
Called pt/daughter again still no answer LMOM RTC.Eric KitchenJohny Chess

## 2020-03-15 ENCOUNTER — Encounter (HOSPITAL_COMMUNITY): Payer: Self-pay | Admitting: Physician Assistant

## 2020-03-18 NOTE — Telephone Encounter (Signed)
Have reach out to pt on several occassions no answer nor call back to make hosp f/u. Closing encounter,,,/lmb

## 2020-03-21 ENCOUNTER — Other Ambulatory Visit: Payer: Self-pay | Admitting: Internal Medicine

## 2020-03-21 NOTE — Telephone Encounter (Signed)
Please refill as per office routine med refill policy (all routine meds refilled for 3 mo or monthly per pt preference up to one year from last visit, then month to month grace period for 3 mo, then further med refills will have to be denied)  

## 2020-03-25 ENCOUNTER — Telehealth (HOSPITAL_COMMUNITY): Payer: Self-pay | Admitting: Physician Assistant

## 2020-03-25 NOTE — Telephone Encounter (Signed)
Just an FYI. We have made several attempts to contact this patient including sending a letter to schedule or reschedule their echocardiogram and Myocardial Stress teset. We will be removing the patient from the echo/nuclear WQ.     03/15/20 called and VM is full, mailed letter/LBW  03/13/20 [facility] to schedule echo and Myoview @ 9:17/LBW     Thank you

## 2020-03-26 ENCOUNTER — Other Ambulatory Visit: Payer: Self-pay | Admitting: Internal Medicine

## 2020-03-26 NOTE — Telephone Encounter (Signed)
Please refill as per office routine med refill policy (all routine meds refilled for 3 mo or monthly per pt preference up to one year from last visit, then month to month grace period for 3 mo, then further med refills will have to be denied)  

## 2020-05-10 ENCOUNTER — Other Ambulatory Visit: Payer: Self-pay | Admitting: Internal Medicine

## 2020-05-10 NOTE — Telephone Encounter (Signed)
Please refill as per office routine med refill policy (all routine meds refilled for 3 mo or monthly per pt preference up to one year from last visit, then month to month grace period for 3 mo, then further med refills will have to be denied)  

## 2020-06-14 ENCOUNTER — Other Ambulatory Visit: Payer: Self-pay | Admitting: Internal Medicine

## 2020-06-18 ENCOUNTER — Other Ambulatory Visit: Payer: Self-pay | Admitting: Internal Medicine

## 2020-06-18 NOTE — Telephone Encounter (Signed)
Please refill as per office routine med refill policy (all routine meds refilled for 3 mo or monthly per pt preference up to one year from last visit, then month to month grace period for 3 mo, then further med refills will have to be denied)  

## 2020-07-24 ENCOUNTER — Other Ambulatory Visit: Payer: Self-pay | Admitting: Internal Medicine

## 2020-07-24 NOTE — Telephone Encounter (Signed)
Please refill as per office routine med refill policy (all routine meds refilled for 3 mo or monthly per pt preference up to one year from last visit, then month to month grace period for 3 mo, then further med refills will have to be denied)  

## 2021-07-17 ENCOUNTER — Other Ambulatory Visit: Payer: Self-pay

## 2021-07-17 ENCOUNTER — Emergency Department (HOSPITAL_BASED_OUTPATIENT_CLINIC_OR_DEPARTMENT_OTHER): Payer: Medicare HMO | Admitting: Radiology

## 2021-07-17 ENCOUNTER — Emergency Department (HOSPITAL_BASED_OUTPATIENT_CLINIC_OR_DEPARTMENT_OTHER)
Admission: EM | Admit: 2021-07-17 | Discharge: 2021-07-17 | Disposition: A | Discharge status: Home / Self Care | Payer: Medicare HMO | Attending: Emergency Medicine | Admitting: Emergency Medicine

## 2021-07-17 ENCOUNTER — Encounter (HOSPITAL_BASED_OUTPATIENT_CLINIC_OR_DEPARTMENT_OTHER): Payer: Self-pay | Admitting: Emergency Medicine

## 2021-07-17 DIAGNOSIS — E1122 Type 2 diabetes mellitus with diabetic chronic kidney disease: Secondary | ICD-10-CM | POA: Diagnosis not present

## 2021-07-17 DIAGNOSIS — Z7984 Long term (current) use of oral hypoglycemic drugs: Secondary | ICD-10-CM | POA: Diagnosis not present

## 2021-07-17 DIAGNOSIS — Z8546 Personal history of malignant neoplasm of prostate: Secondary | ICD-10-CM | POA: Insufficient documentation

## 2021-07-17 DIAGNOSIS — I129 Hypertensive chronic kidney disease with stage 1 through stage 4 chronic kidney disease, or unspecified chronic kidney disease: Secondary | ICD-10-CM | POA: Insufficient documentation

## 2021-07-17 DIAGNOSIS — Z794 Long term (current) use of insulin: Secondary | ICD-10-CM | POA: Insufficient documentation

## 2021-07-17 DIAGNOSIS — Z87891 Personal history of nicotine dependence: Secondary | ICD-10-CM | POA: Insufficient documentation

## 2021-07-17 DIAGNOSIS — N183 Chronic kidney disease, stage 3 unspecified: Secondary | ICD-10-CM | POA: Diagnosis not present

## 2021-07-17 DIAGNOSIS — E876 Hypokalemia: Secondary | ICD-10-CM | POA: Insufficient documentation

## 2021-07-17 DIAGNOSIS — I10 Essential (primary) hypertension: Secondary | ICD-10-CM

## 2021-07-17 LAB — BASIC METABOLIC PANEL
Anion gap: 8 (ref 5–15)
BUN: 19 mg/dL (ref 8–23)
CO2: 26 mmol/L (ref 22–32)
Calcium: 9.8 mg/dL (ref 8.9–10.3)
Chloride: 102 mmol/L (ref 98–111)
Creatinine, Ser: 1.55 mg/dL — ABNORMAL HIGH (ref 0.61–1.24)
GFR, Estimated: 44 mL/min — ABNORMAL LOW (ref >60)
Glucose, Bld: 170 mg/dL — ABNORMAL HIGH (ref 70–99)
Potassium: 3.1 mmol/L — ABNORMAL LOW (ref 3.5–5.1)
Sodium: 136 mmol/L (ref 135–145)

## 2021-07-17 LAB — CBC
HCT: 40.8 % (ref 39.0–52.0)
Hemoglobin: 13.6 g/dL (ref 13.0–17.0)
MCH: 29.3 pg (ref 26.0–34.0)
MCHC: 33.3 g/dL (ref 30.0–36.0)
MCV: 87.9 fL (ref 80.0–100.0)
Platelets: 193 10*3/uL (ref 150–400)
RBC: 4.64 MIL/uL (ref 4.22–5.81)
RDW: 13.9 % (ref 11.5–15.5)
WBC: 5.4 10*3/uL (ref 4.0–10.5)
nRBC: 0 % (ref 0.0–0.2)

## 2021-07-17 LAB — TROPONIN I (HIGH SENSITIVITY): Troponin I (High Sensitivity): 17 ng/L (ref <18)

## 2021-07-17 MED ORDER — AMLODIPINE BESYLATE 5 MG PO TABS: 5.0000 mg | ORAL_TABLET | Freq: Every day | ORAL | 0 refills | Status: DC

## 2021-07-17 MED ORDER — POTASSIUM CHLORIDE CRYS ER 20 MEQ PO TBCR
40.0000 meq | EXTENDED_RELEASE_TABLET | Freq: Once | ORAL | Status: AC
  Administered 2021-07-17: 13:00:00 40 meq via ORAL
  Filled 2021-07-17: qty 2

## 2021-07-17 MED ORDER — FUROSEMIDE 40 MG PO TABS
40.0000 mg | ORAL_TABLET | Freq: Once | ORAL | Status: AC
  Administered 2021-07-17: 13:00:00 40 mg via ORAL
  Filled 2021-07-17: qty 1

## 2021-07-17 MED ORDER — POTASSIUM CHLORIDE CRYS ER 20 MEQ PO TBCR: 20.0000 meq | EXTENDED_RELEASE_TABLET | Freq: Two times a day (BID) | ORAL | 0 refills | Status: AC

## 2021-07-17 MED ORDER — AMLODIPINE BESYLATE 5 MG PO TABS
5.0000 mg | ORAL_TABLET | Freq: Once | ORAL | Status: AC
  Administered 2021-07-17: 13:00:00 5 mg via ORAL
  Filled 2021-07-17: qty 1

## 2021-07-17 MED ORDER — POTASSIUM CHLORIDE CRYS ER 20 MEQ PO TBCR: 20.0000 meq | EXTENDED_RELEASE_TABLET | Freq: Two times a day (BID) | ORAL | 0 refills | Status: DC

## 2021-07-17 MED ORDER — FUROSEMIDE 20 MG PO TABS: 20.0000 mg | ORAL_TABLET | Freq: Every day | ORAL | 0 refills | Status: DC

## 2021-07-17 NOTE — ED Provider Notes (Signed)
[facility] EMERGENCY DEPT Provider Note   CSN: [id number] Arrival date & time: 07/17/21  1104     History Chief Complaint  Patient presents with   Hypertension    Eric Wade is a 80 y.o. male.  HPI Patient blood pressure is identified to be 213/103 yesterday when he was seen at his endocrinologist office.  Patient daughter reports that he did not have any symptoms and they only do the blood pressure was elevated because of the office visit.  He had a normal day yesterday.  Patient is eating per usual and has no pain complaints.  Patient's daughter reports that he ran out of his amlodipine and his Lasix about a week ago.  Medications are supposed to come through mail order but have not arrived.  Patient's daughter called the primary care office which recommended they come to the emergency department to get blood pressure under control and will assist in managing medications and recheck after that.  Patient denies he feels short of breath.  He denies taking any swelling in his legs.  He denies chest pain or headache.  Reports he feels pretty well today.    Past Medical History:  Diagnosis Date   Allergic rhinitis 08/25/2016   DIABETES MELLITUS, TYPE II 05/08/2007   ERECTILE DYSFUNCTION 05/08/2007   GOUT 07/01/2007   HYPERLIPIDEMIA 05/08/2007   HYPERTENSION 05/08/2007   OBESITY 05/08/2007   Preventative health care 12/28/2010   Prostate cancer ([place]) 07/02/2008   Qualifier: Diagnosis of  By: Jenny Reichmann MD, Hunt Oris    PSA, INCREASED 07/02/2008   RENAL INSUFFICIENCY 07/01/2007   Seasonal allergies 12/31/2018    Patient Active Problem List   Diagnosis Date Noted   Nonspecific abnormal electrocardiogram (ECG) (EKG)    Hyperglycemia 03/11/2020   Bradycardia 03/11/2020   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/21/2019   AMS (altered mental status) 02/21/2019   Neurology follow-up encounter 02/20/2019   Episodic altered awareness 02/20/2019   Acute kidney injury superimposed on  chronic kidney disease ([place]) 02/20/2019   Normocytic anemia 02/20/2019   Increased ammonia level 01/10/2019   Hypokalemia    Hypophosphatemia    Acute metabolic encephalopathy    Encephalopathy acute    Stroke ([place]) 12/28/2018   Allergic rhinitis 08/25/2016   Preventative health care 12/28/2010   Prostate cancer ([place]) 07/02/2008   GOUT 07/01/2007   CKD (chronic kidney disease) stage 3, GFR 30-59 ml/min ([facility]) 07/01/2007   Insulin dependent diabetes mellitus 05/08/2007   Hyperlipidemia 05/08/2007   OBESITY 05/08/2007   ERECTILE DYSFUNCTION 05/08/2007   Essential hypertension 05/08/2007    History reviewed. No pertinent surgical history.     Family History  Problem Relation Age of Onset   Hypertension Other     Social History   Tobacco Use   Smoking status: Former   Smokeless tobacco: Never  Scientific laboratory technician Use: Never used  Substance Use Topics   Alcohol use: No   Drug use: No    Home Medications Prior to Admission medications   Medication Sig Start Date End Date Taking? Authorizing Provider  albuterol (VENTOLIN HFA) 108 (90 Base) MCG/ACT inhaler Inhale 2 puffs into the lungs every 4 (four) hours as needed for wheezing or shortness of breath. Every 4-6 hrs 12/01/19  Yes [provider]  amLODipine (NORVASC) 5 MG tablet Take 1 tablet (5 mg total) by mouth daily. 03/12/20 07/17/21 Yes Gherghe, Vella Redhead, MD  amLODipine (NORVASC) 5 MG tablet Take 1 tablet (5 mg total) by  mouth daily. 07/17/21  Yes Charlesetta Shanks, MD  atorvastatin (LIPITOR) 20 MG tablet TAKE 1 TABLET (20 MG TOTAL) BY MOUTH DAILY AT 6 PM. Patient taking differently: Take 20 mg by mouth at bedtime. 05/13/20  Yes [name], MD  furosemide (LASIX) 20 MG tablet Take 1 tablet (20 mg total) by mouth daily. 07/17/21  Yes Charlesetta Shanks, MD  furosemide (LASIX) 40 MG tablet Take 40 mg by mouth daily.  01/18/20  Yes [provider]  glipiZIDE (GLUCOTROL) 10 MG tablet Take 10 mg by mouth daily  before breakfast.   Yes [provider]  hydrALAZINE (APRESOLINE) 50 MG tablet TAKE 1 & 1/2 TABLET EVERY 6 HOURS Patient taking differently: Take 50 mg by mouth 2 (two) times daily. 06/14/20  Yes [name], MD  insulin aspart (NOVOLOG FLEXPEN) 100 UNIT/ML FlexPen Inject into the skin See admin instructions. Blood sugar 150-199-2 units,,200-249-4 units,,250-299-6 units,,300-349-8 units,,greater than 350=10 units 3 times daily with meals   Yes [provider]  insulin glargine (LANTUS) 100 UNIT/ML injection Inject 22 Units into the skin at bedtime.   Yes [provider]  potassium chloride SA (KLOR-CON) 20 MEQ tablet Take 1 tablet (20 mEq total) by mouth 2 (two) times daily. 07/17/21  Yes Charlesetta Shanks, MD  tamsulosin (FLOMAX) 0.4 MG CAPS capsule Take 1 capsule (0.4 mg total) by mouth at bedtime. 03/12/20  Yes Caren Griffins, MD  Accu-Chek FastClix Lancets MISC USE TO CHECK BLOOD SUGAR UP TO 4 TIMES A DAY (E11.9) 10/07/19   [name], MD  ACCU-CHEK GUIDE test strip USE TO CHECK BLOOD SUGAR UP TO 4 TIMES A DAY. 03/26/20   [name], MD  blood glucose meter kit and supplies KIT Dispense based on patient and insurance preference. Use up to four times daily as directed. (FOR ICD-9 250.00, 250.01). 01/03/19   Florencia Reasons, MD  DILT-XR 240 MG 24 hr capsule TAKE 1 CAPSULE BY MOUTH EVERY DAY Patient not taking: Reported on 07/17/2021 06/14/20   [name], MD  insulin NPH Human (NOVOLIN N) 100 UNIT/ML injection Inject 0.12 mLs (12 Units total) into the skin daily before breakfast. Patient not taking: Reported on 07/17/2021 02/24/19   Swayze, Ava, DO    Allergies    Nsaids  Review of Systems   Review of Systems 10 Systems reviewed and negative except as per HPI Physical Exam Updated Vital Signs BP (!) 213/105 (BP Location: Right Arm)   Pulse 65   Temp 97.8 F (36.6 C) (Oral)   Resp 16   Ht $R'6\' 2"'oY$  (1.88 m)   Wt 81.6 kg   SpO2 100%   BMI 23.10 kg/m   Physical  Exam Constitutional:      Comments: Alert nontoxic no respiratory distress at rest.  HENT:     Mouth/Throat:     Pharynx: Oropharynx is clear.  Eyes:     Extraocular Movements: Extraocular movements intact.  Cardiovascular:     Rate and Rhythm: Normal rate and regular rhythm.  Pulmonary:     Comments: No respiratory distress.  Fine crackles at lung bases Abdominal:     General: There is no distension.     Palpations: Abdomen is soft.     Tenderness: There is no abdominal tenderness. There is no guarding.  Musculoskeletal:        General: No swelling or tenderness. Normal range of motion.     Right lower leg: No edema.     Left lower leg:  No edema.     Comments: No peripheral edema.  Calves are soft and nontender  Skin:    General: Skin is warm and dry.  Neurological:     General: No focal deficit present.     Coordination: Coordination normal.  Psychiatric:        Mood and Affect: Mood normal.    ED Results / Procedures / Treatments   Labs (all labs ordered are listed, but only abnormal results are displayed) Labs Reviewed  BASIC METABOLIC PANEL - Abnormal; Notable for the following components:      Result Value   Potassium 3.1 (*)    Glucose, Bld 170 (*)    Creatinine, Ser 1.55 (*)    GFR, Estimated 44 (*)    All other components within normal limits  CBC  TROPONIN I (HIGH SENSITIVITY)    EKG EKG Interpretation  Date/Time:  Thursday July 17 2021 11:34:56 EST Ventricular Rate:  60 PR Interval:  212 QRS Duration: 99 QT Interval:  420 QTC Calculation: 420 R Axis:   -5 Text Interpretation: Sinus rhythm Borderline prolonged PR interval Repol abnrm suggests ischemia, lateral leads no sig change from previous Confirmed by Charlesetta Shanks [phone]) on 07/17/2021 12:35:33 PM  Radiology DG Chest 2 View  Result Date: 07/17/2021 CLINICAL DATA:  Hypertension EXAM: CHEST - 2 VIEW COMPARISON:  03/11/2020 FINDINGS: Poor inspiration. Heart size within normal limits.  Tortuous aorta. The lungs are clear. The vascularity is normal. No effusions. No significant bone finding. IMPRESSION: Poor inspiration. Aortic atherosclerosis and tortuosity. Otherwise no active disease. Electronically Signed   By: Nelson Chimes M.D.   On: 07/17/2021 11:52    Procedures Procedures   Medications Ordered in ED Medications  amLODipine (NORVASC) tablet 5 mg (5 mg Oral Given 07/17/21 1238)  furosemide (LASIX) tablet 40 mg (40 mg Oral Given 07/17/21 1238)  potassium chloride SA (KLOR-CON) CR tablet 40 mEq (40 mEq Oral Given 07/17/21 1320)    ED Course  I have reviewed the triage vital signs and the nursing notes.  Pertinent labs & imaging results that were available during my care of the patient were reviewed by me and considered in my medical decision making (see chart for details).    MDM Rules/Calculators/A&P                           Patient presents for further evaluation of hypertension.  Significantly elevated blood pressure was identified yesterday incidentally at an outpatient appointment.  Review of systems does not suggest patient is experiencing endorgan damage.  He has no complaints and is functioning at baseline.  Patient has been out of his medication of amlodipine and Lasix for about a week.  His daughter reports he is still taking his hydralazine.  At this time I suspect noncompliance is the underlying cause for uncontrolled hypertension at this time.  Prescriptions have been provided for the amlodipine and Lasix.  Patient is also found to be moderately hypokalemic.  Will start potassium with caveat that patient's Lasix and potassium need close outpatient monitoring.  Have also reviewed instructions with the patient's daughter about increasing hydralazine dose as needed for blood pressure management at home until medications are stabilized and blood pressure is again adequately controlled. Final Clinical Impression(s) / ED Diagnoses Final diagnoses:  Essential  hypertension  Hypokalemia    Rx / DC Orders ED Discharge Orders          Ordered    amLODipine (  NORVASC) 5 MG tablet  Daily        07/17/21 1537    furosemide (LASIX) 20 MG tablet  Daily        07/17/21 1537    potassium chloride SA (KLOR-CON) 20 MEQ tablet  2 times daily        07/17/21 1540             Charlesetta Shanks, MD 07/17/21 1546

## 2021-07-17 NOTE — Discharge Instructions (Signed)
1.  Restart your amlodipine as prescribed. 2.  At this time I am having you start your Lasix back at 20 mg a day.  You may go back up to 40 mg a day as prescribed after you have follow-up with your doctor.  Excess amounts of Lasix can decrease your potassium which is slightly low today as well as takeoff too much fluid and strain your kidneys.  Your fluid balance and potassium need to be carefully monitored by your primary care doctor.  Also start potassium for the next 5 to 6 days as prescribed.  This will need to be rechecked.  You may not need to take potassium regularly but often people who are taking Lasix do need potassium supplement. 3.  Your blood pressure is very high but I expect it will improve when you are back on your regular medications.  Monitor your blood pressure at home 2-3 times a day for the upcoming week.  If your blood pressures remaining greater than 160s over 90s, you may increase your hydralazine dose.  At this time your hydralazine dose is listed as twice daily.  You may take the hydralazine 3 times a day. 4.  Return to the emergency department if you are developing headache, confusion, blurred vision, chest pain shortness of breath or other concerning symptoms.

## 2021-07-17 NOTE — ED Triage Notes (Signed)
Pt via pov from home with hypertension. Pt's daughter states they went to kidney doctor yesterday and pressure was 218/103 and they were told to contact pcp, who told them to come to ED. Pt alert & oriented, nad noted.

## 2021-08-11 ENCOUNTER — Other Ambulatory Visit: Payer: Self-pay

## 2021-08-11 ENCOUNTER — Emergency Department (HOSPITAL_COMMUNITY): Payer: Medicare HMO

## 2021-08-11 ENCOUNTER — Inpatient Hospital Stay (HOSPITAL_COMMUNITY)
Admission: EM | Admit: 2021-08-11 | Discharge: 2021-08-31 | DRG: 871 | Disposition: E | Payer: Medicare HMO | Attending: Pulmonary Disease | Admitting: Pulmonary Disease

## 2021-08-11 ENCOUNTER — Emergency Department (HOSPITAL_COMMUNITY)
Admission: EM | Admit: 2021-08-11 | Discharge: 2021-08-11 | Disposition: A | Discharge status: Home / Self Care | Payer: Medicare HMO | Source: Home / Self Care | Attending: Emergency Medicine | Admitting: Emergency Medicine

## 2021-08-11 ENCOUNTER — Encounter (HOSPITAL_COMMUNITY): Payer: Self-pay | Admitting: Emergency Medicine

## 2021-08-11 DIAGNOSIS — N179 Acute kidney failure, unspecified: Secondary | ICD-10-CM | POA: Diagnosis present

## 2021-08-11 DIAGNOSIS — Z794 Long term (current) use of insulin: Secondary | ICD-10-CM | POA: Insufficient documentation

## 2021-08-11 DIAGNOSIS — R29810 Facial weakness: Secondary | ICD-10-CM | POA: Diagnosis present

## 2021-08-11 DIAGNOSIS — M109 Gout, unspecified: Secondary | ICD-10-CM | POA: Diagnosis present

## 2021-08-11 DIAGNOSIS — N183 Chronic kidney disease, stage 3 unspecified: Secondary | ICD-10-CM | POA: Diagnosis present

## 2021-08-11 DIAGNOSIS — Z886 Allergy status to analgesic agent status: Secondary | ICD-10-CM

## 2021-08-11 DIAGNOSIS — I4891 Unspecified atrial fibrillation: Secondary | ICD-10-CM | POA: Diagnosis not present

## 2021-08-11 DIAGNOSIS — I4901 Ventricular fibrillation: Secondary | ICD-10-CM | POA: Diagnosis not present

## 2021-08-11 DIAGNOSIS — Z8546 Personal history of malignant neoplasm of prostate: Secondary | ICD-10-CM | POA: Diagnosis not present

## 2021-08-11 DIAGNOSIS — Z20822 Contact with and (suspected) exposure to covid-19: Secondary | ICD-10-CM | POA: Insufficient documentation

## 2021-08-11 DIAGNOSIS — I129 Hypertensive chronic kidney disease with stage 1 through stage 4 chronic kidney disease, or unspecified chronic kidney disease: Secondary | ICD-10-CM | POA: Insufficient documentation

## 2021-08-11 DIAGNOSIS — I4721 Torsades de pointes: Secondary | ICD-10-CM | POA: Diagnosis not present

## 2021-08-11 DIAGNOSIS — Z66 Do not resuscitate: Secondary | ICD-10-CM | POA: Diagnosis present

## 2021-08-11 DIAGNOSIS — Z87891 Personal history of nicotine dependence: Secondary | ICD-10-CM

## 2021-08-11 DIAGNOSIS — R569 Unspecified convulsions: Secondary | ICD-10-CM | POA: Diagnosis present

## 2021-08-11 DIAGNOSIS — A419 Sepsis, unspecified organism: Secondary | ICD-10-CM | POA: Diagnosis present

## 2021-08-11 DIAGNOSIS — R4182 Altered mental status, unspecified: Secondary | ICD-10-CM

## 2021-08-11 DIAGNOSIS — R471 Dysarthria and anarthria: Secondary | ICD-10-CM | POA: Diagnosis present

## 2021-08-11 DIAGNOSIS — E872 Acidosis, unspecified: Secondary | ICD-10-CM | POA: Diagnosis present

## 2021-08-11 DIAGNOSIS — I69354 Hemiplegia and hemiparesis following cerebral infarction affecting left non-dominant side: Secondary | ICD-10-CM

## 2021-08-11 DIAGNOSIS — E119 Type 2 diabetes mellitus without complications: Secondary | ICD-10-CM | POA: Insufficient documentation

## 2021-08-11 DIAGNOSIS — E1165 Type 2 diabetes mellitus with hyperglycemia: Secondary | ICD-10-CM | POA: Diagnosis present

## 2021-08-11 DIAGNOSIS — E785 Hyperlipidemia, unspecified: Secondary | ICD-10-CM | POA: Diagnosis present

## 2021-08-11 DIAGNOSIS — I469 Cardiac arrest, cause unspecified: Secondary | ICD-10-CM | POA: Diagnosis present

## 2021-08-11 DIAGNOSIS — W19XXXA Unspecified fall, initial encounter: Secondary | ICD-10-CM | POA: Diagnosis present

## 2021-08-11 DIAGNOSIS — R55 Syncope and collapse: Secondary | ICD-10-CM | POA: Diagnosis present

## 2021-08-11 DIAGNOSIS — R5383 Other fatigue: Secondary | ICD-10-CM | POA: Insufficient documentation

## 2021-08-11 DIAGNOSIS — Z79899 Other long term (current) drug therapy: Secondary | ICD-10-CM | POA: Insufficient documentation

## 2021-08-11 DIAGNOSIS — Z8249 Family history of ischemic heart disease and other diseases of the circulatory system: Secondary | ICD-10-CM

## 2021-08-11 DIAGNOSIS — I493 Ventricular premature depolarization: Secondary | ICD-10-CM | POA: Diagnosis not present

## 2021-08-11 DIAGNOSIS — G928 Other toxic encephalopathy: Secondary | ICD-10-CM | POA: Diagnosis present

## 2021-08-11 DIAGNOSIS — R6521 Severe sepsis with septic shock: Secondary | ICD-10-CM | POA: Diagnosis present

## 2021-08-11 DIAGNOSIS — R29710 NIHSS score 10: Secondary | ICD-10-CM | POA: Diagnosis present

## 2021-08-11 DIAGNOSIS — E1122 Type 2 diabetes mellitus with diabetic chronic kidney disease: Secondary | ICD-10-CM | POA: Diagnosis present

## 2021-08-11 DIAGNOSIS — Z9079 Acquired absence of other genital organ(s): Secondary | ICD-10-CM

## 2021-08-11 LAB — TROPONIN I (HIGH SENSITIVITY): Troponin I (High Sensitivity): 16 ng/L (ref <18)

## 2021-08-11 LAB — DIFFERENTIAL
Abs Immature Granulocytes: 0.06 10*3/uL (ref 0.00–0.07)
Basophils Absolute: 0.1 10*3/uL (ref 0.0–0.1)
Basophils Relative: 0 %
Eosinophils Absolute: 0 10*3/uL (ref 0.0–0.5)
Eosinophils Relative: 0 %
Immature Granulocytes: 1 %
Lymphocytes Relative: 25 %
Lymphs Abs: 3.1 10*3/uL (ref 0.7–4.0)
Monocytes Absolute: 0.9 10*3/uL (ref 0.1–1.0)
Monocytes Relative: 7 %
Neutro Abs: 8.3 10*3/uL — ABNORMAL HIGH (ref 1.7–7.7)
Neutrophils Relative %: 67 %

## 2021-08-11 LAB — CBC
HCT: 41.1 % (ref 39.0–52.0)
Hemoglobin: 13.5 g/dL (ref 13.0–17.0)
MCH: 29.5 pg (ref 26.0–34.0)
MCHC: 32.8 g/dL (ref 30.0–36.0)
MCV: 89.7 fL (ref 80.0–100.0)
Platelets: 156 10*3/uL (ref 150–400)
RBC: 4.58 MIL/uL (ref 4.22–5.81)
RDW: 13.6 % (ref 11.5–15.5)
WBC: 12.4 10*3/uL — ABNORMAL HIGH (ref 4.0–10.5)
nRBC: 0 % (ref 0.0–0.2)

## 2021-08-11 LAB — I-STAT ARTERIAL BLOOD GAS, ED
Acid-base deficit: 12 mmol/L — ABNORMAL HIGH (ref 0.0–2.0)
Acid-base deficit: 14 mmol/L — ABNORMAL HIGH (ref 0.0–2.0)
Bicarbonate: 14.5 mmol/L — ABNORMAL LOW (ref 20.0–28.0)
Bicarbonate: 14.8 mmol/L — ABNORMAL LOW (ref 20.0–28.0)
Calcium, Ion: 1.13 mmol/L — ABNORMAL LOW (ref 1.15–1.40)
Calcium, Ion: 1.29 mmol/L (ref 1.15–1.40)
HCT: 34 % — ABNORMAL LOW (ref 39.0–52.0)
HCT: 32 % — ABNORMAL LOW (ref 39.0–52.0)
Hemoglobin: 11.6 g/dL — ABNORMAL LOW (ref 13.0–17.0)
Hemoglobin: 10.9 g/dL — ABNORMAL LOW (ref 13.0–17.0)
O2 Saturation: 95 %
O2 Saturation: 92 %
Patient temperature: 98.6
Patient temperature: 99.9
Potassium: 3.9 mmol/L (ref 3.5–5.1)
Potassium: 4 mmol/L (ref 3.5–5.1)
Sodium: 139 mmol/L (ref 135–145)
Sodium: 139 mmol/L (ref 135–145)
TCO2: 16 mmol/L — ABNORMAL LOW (ref 22–32)
TCO2: 16 mmol/L — ABNORMAL LOW (ref 22–32)
pCO2 arterial: 35.3 mmHg (ref 32.0–48.0)
pCO2 arterial: 45.6 mmHg (ref 32.0–48.0)
pH, Arterial: 7.221 — ABNORMAL LOW (ref 7.350–7.450)
pH, Arterial: 7.123 — CL (ref 7.350–7.450)
pO2, Arterial: 91 mmHg (ref 83.0–108.0)
pO2, Arterial: 87 mmHg (ref 83.0–108.0)

## 2021-08-11 LAB — RESP PANEL BY RT-PCR (FLU A&B, COVID) ARPGX2
Influenza A by PCR: NEGATIVE
Influenza A by PCR: NEGATIVE
Influenza B by PCR: NEGATIVE
Influenza B by PCR: NEGATIVE
SARS Coronavirus 2 by RT PCR: NEGATIVE
SARS Coronavirus 2 by RT PCR: NEGATIVE

## 2021-08-11 LAB — I-STAT CHEM 8, ED
BUN: 26 mg/dL — ABNORMAL HIGH (ref 8–23)
Calcium, Ion: 1.19 mmol/L (ref 1.15–1.40)
Chloride: 103 mmol/L (ref 98–111)
Creatinine, Ser: 2.5 mg/dL — ABNORMAL HIGH (ref 0.61–1.24)
Glucose, Bld: 497 mg/dL — ABNORMAL HIGH (ref 70–99)
HCT: 42 % (ref 39.0–52.0)
Hemoglobin: 14.3 g/dL (ref 13.0–17.0)
Potassium: 3.7 mmol/L (ref 3.5–5.1)
Sodium: 140 mmol/L (ref 135–145)
TCO2: 24 mmol/L (ref 22–32)

## 2021-08-11 LAB — CBC WITH DIFFERENTIAL/PLATELET
Abs Immature Granulocytes: 0.06 10*3/uL (ref 0.00–0.07)
Basophils Absolute: 0.1 10*3/uL (ref 0.0–0.1)
Basophils Relative: 1 %
Eosinophils Absolute: 0.1 10*3/uL (ref 0.0–0.5)
Eosinophils Relative: 1 %
HCT: 39.8 % (ref 39.0–52.0)
Hemoglobin: 13.2 g/dL (ref 13.0–17.0)
Immature Granulocytes: 1 %
Lymphocytes Relative: 18 %
Lymphs Abs: 2.3 10*3/uL (ref 0.7–4.0)
MCH: 29.3 pg (ref 26.0–34.0)
MCHC: 33.2 g/dL (ref 30.0–36.0)
MCV: 88.4 fL (ref 80.0–100.0)
Monocytes Absolute: 1 10*3/uL (ref 0.1–1.0)
Monocytes Relative: 8 %
Neutro Abs: 9.4 10*3/uL — ABNORMAL HIGH (ref 1.7–7.7)
Neutrophils Relative %: 71 %
Platelets: 172 10*3/uL (ref 150–400)
RBC: 4.5 MIL/uL (ref 4.22–5.81)
RDW: 13.4 % (ref 11.5–15.5)
WBC: 12.9 10*3/uL — ABNORMAL HIGH (ref 4.0–10.5)
nRBC: 0 % (ref 0.0–0.2)

## 2021-08-11 LAB — CBG MONITORING, ED
Glucose-Capillary: 309 mg/dL — ABNORMAL HIGH (ref 70–99)
Glucose-Capillary: 509 mg/dL (ref 70–99)
Glucose-Capillary: 568 mg/dL (ref 70–99)
Glucose-Capillary: 564 mg/dL (ref 70–99)

## 2021-08-11 LAB — COMPREHENSIVE METABOLIC PANEL
ALT: 20 U/L (ref 0–44)
ALT: 24 U/L (ref 0–44)
AST: 24 U/L (ref 15–41)
AST: 28 U/L (ref 15–41)
Albumin: 3.4 g/dL — ABNORMAL LOW (ref 3.5–5.0)
Albumin: 3.4 g/dL — ABNORMAL LOW (ref 3.5–5.0)
Alkaline Phosphatase: 73 U/L (ref 38–126)
Alkaline Phosphatase: 79 U/L (ref 38–126)
Anion gap: 10 (ref 5–15)
Anion gap: 12 (ref 5–15)
BUN: 20 mg/dL (ref 8–23)
BUN: 25 mg/dL — ABNORMAL HIGH (ref 8–23)
CO2: 27 mmol/L (ref 22–32)
CO2: 22 mmol/L (ref 22–32)
Calcium: 9.2 mg/dL (ref 8.9–10.3)
Calcium: 9.3 mg/dL (ref 8.9–10.3)
Chloride: 102 mmol/L (ref 98–111)
Chloride: 102 mmol/L (ref 98–111)
Creatinine, Ser: 2.19 mg/dL — ABNORMAL HIGH (ref 0.61–1.24)
Creatinine, Ser: 2.53 mg/dL — ABNORMAL HIGH (ref 0.61–1.24)
GFR, Estimated: 29 mL/min — ABNORMAL LOW (ref >60)
GFR, Estimated: 25 mL/min — ABNORMAL LOW (ref >60)
Glucose, Bld: 332 mg/dL — ABNORMAL HIGH (ref 70–99)
Glucose, Bld: 563 mg/dL (ref 70–99)
Potassium: 3 mmol/L — ABNORMAL LOW (ref 3.5–5.1)
Potassium: 3.8 mmol/L (ref 3.5–5.1)
Sodium: 139 mmol/L (ref 135–145)
Sodium: 136 mmol/L (ref 135–145)
Total Bilirubin: 0.7 mg/dL (ref 0.3–1.2)
Total Bilirubin: 0.6 mg/dL (ref 0.3–1.2)
Total Protein: 7 g/dL (ref 6.5–8.1)
Total Protein: 7.5 g/dL (ref 6.5–8.1)

## 2021-08-11 LAB — MAGNESIUM: Magnesium: 2.1 mg/dL (ref 1.7–2.4)

## 2021-08-11 LAB — PROTIME-INR
INR: 1 (ref 0.8–1.2)
Prothrombin Time: 13.2 seconds (ref 11.4–15.2)

## 2021-08-11 LAB — LACTIC ACID, PLASMA: Lactic Acid, Venous: 6 mmol/L (ref 0.5–1.9)

## 2021-08-11 LAB — GLUCOSE, CAPILLARY: Glucose-Capillary: >600 mg/dL (ref 70–99)

## 2021-08-11 LAB — APTT: aPTT: 25 seconds (ref 24–36)

## 2021-08-11 MED ORDER — SODIUM CHLORIDE 0.9 % IV SOLN: 2.0000 g | Freq: Once | INTRAVENOUS | Status: DC

## 2021-08-11 MED ORDER — ATROPINE SULFATE 1 MG/ML IJ SOLN
INTRAMUSCULAR | Status: AC | PRN
  Administered 2021-08-11: 1 mg via INTRAVENOUS

## 2021-08-11 MED ORDER — EPINEPHRINE 1 MG/10ML IJ SOSY
PREFILLED_SYRINGE | INTRAMUSCULAR | Status: AC | PRN
  Administered 2021-08-11: 1 mg via INTRAVENOUS

## 2021-08-11 MED ORDER — EPINEPHRINE HCL 5 MG/250ML IV SOLN IN NS
0.5000 ug/min | INTRAVENOUS | Status: DC
  Administered 2021-08-11: 5 ug/min via INTRAVENOUS
  Filled 2021-08-11: qty 250

## 2021-08-11 MED ORDER — LORAZEPAM 2 MG/ML IJ SOLN
INTRAMUSCULAR | Status: AC
  Administered 2021-08-11: 1 mg via INTRAVENOUS
  Filled 2021-08-11: qty 1

## 2021-08-11 MED ORDER — EPINEPHRINE 1 MG/10ML IJ SOSY
PREFILLED_SYRINGE | INTRAMUSCULAR | Status: AC | PRN
  Administered 2021-08-11 (×5): 1 mg via INTRAVENOUS

## 2021-08-11 MED ORDER — DOCUSATE SODIUM 100 MG PO CAPS: 100.0000 mg | ORAL_CAPSULE | Freq: Two times a day (BID) | ORAL | Status: DC | PRN

## 2021-08-11 MED ORDER — INSULIN REGULAR(HUMAN) IN NACL 100-0.9 UT/100ML-% IV SOLN
INTRAVENOUS | Status: DC
  Administered 2021-08-11: 11 [IU]/h via INTRAVENOUS
  Filled 2021-08-11: qty 100

## 2021-08-11 MED ORDER — IOHEXOL 350 MG/ML SOLN
75.0000 mL | Freq: Once | INTRAVENOUS | Status: AC | PRN
  Administered 2021-08-11: 75 mL via INTRAVENOUS

## 2021-08-11 MED ORDER — HEPARIN SODIUM (PORCINE) 5000 UNIT/ML IJ SOLN: 5000.0000 [IU] | Freq: Three times a day (TID) | INTRAMUSCULAR | Status: DC

## 2021-08-11 MED ORDER — ETOMIDATE 2 MG/ML IV SOLN
INTRAVENOUS | Status: AC
  Filled 2021-08-11: qty 20

## 2021-08-11 MED ORDER — VANCOMYCIN HCL 1750 MG/350ML IV SOLN
1750.0000 mg | Freq: Once | INTRAVENOUS | Status: DC
  Filled 2021-08-11 (×3): qty 350

## 2021-08-11 MED ORDER — POTASSIUM CHLORIDE 10 MEQ/100ML IV SOLN: 10.0000 meq | INTRAVENOUS | Status: AC

## 2021-08-11 MED ORDER — METRONIDAZOLE 500 MG/100ML IV SOLN
500.0000 mg | Freq: Once | INTRAVENOUS | Status: AC
  Administered 2021-08-12: 500 mg via INTRAVENOUS
  Filled 2021-08-11: qty 100

## 2021-08-11 MED ORDER — INSULIN ASPART 100 UNIT/ML IJ SOLN: 8.0000 [IU] | Freq: Once | INTRAMUSCULAR | Status: DC

## 2021-08-11 MED ORDER — SODIUM CHLORIDE 0.9 % IV SOLN
2.0000 g | INTRAVENOUS | Status: DC
  Administered 2021-08-11: 2 g via INTRAVENOUS
  Filled 2021-08-11: qty 2

## 2021-08-11 MED ORDER — FENTANYL BOLUS VIA INFUSION
25.0000 ug | INTRAVENOUS | Status: DC | PRN
  Filled 2021-08-11: qty 25

## 2021-08-11 MED ORDER — SODIUM CHLORIDE 0.9% FLUSH
3.0000 mL | Freq: Once | INTRAVENOUS | Status: DC
Start: 2021-08-11 — End: 2021-08-12

## 2021-08-11 MED ORDER — POLYETHYLENE GLYCOL 3350 17 G PO PACK: 17.0000 g | PACK | Freq: Every day | ORAL | Status: DC | PRN

## 2021-08-11 MED ORDER — LACTATED RINGERS IV BOLUS
20.0000 mL/kg | Freq: Once | INTRAVENOUS | Status: AC
  Administered 2021-08-12: 1716 mL via INTRAVENOUS

## 2021-08-11 MED ORDER — SODIUM BICARBONATE 8.4 % IV SOLN
50.0000 meq | Freq: Once | INTRAVENOUS | Status: AC
  Administered 2021-08-11: 50 meq via INTRAVENOUS

## 2021-08-11 MED ORDER — POTASSIUM CHLORIDE CRYS ER 20 MEQ PO TBCR
40.0000 meq | EXTENDED_RELEASE_TABLET | Freq: Once | ORAL | Status: AC
  Administered 2021-08-11: 40 meq via ORAL
  Filled 2021-08-11: qty 2

## 2021-08-11 MED ORDER — SODIUM CHLORIDE 0.9 % IV SOLN
250.0000 mL | INTRAVENOUS | Status: DC
  Administered 2021-08-12: 250 mL via INTRAVENOUS

## 2021-08-11 MED ORDER — NOREPINEPHRINE 4 MG/250ML-% IV SOLN
INTRAVENOUS | Status: AC
  Administered 2021-08-11: 60 ug/min via INTRAVENOUS
  Filled 2021-08-11: qty 250

## 2021-08-11 MED ORDER — FENTANYL 2500MCG IN NS 250ML (10MCG/ML) PREMIX INFUSION
25.0000 ug/h | INTRAVENOUS | Status: DC
Start: 2021-08-11 — End: 2021-08-12

## 2021-08-11 MED ORDER — LORAZEPAM 2 MG/ML IJ SOLN: 1.0000 mg | Freq: Once | INTRAMUSCULAR | Status: AC

## 2021-08-11 MED ORDER — ROCURONIUM BROMIDE 10 MG/ML (PF) SYRINGE
PREFILLED_SYRINGE | INTRAVENOUS | Status: AC
  Filled 2021-08-11: qty 10

## 2021-08-11 MED ORDER — LACTATED RINGERS IV SOLN: INTRAVENOUS | Status: DC

## 2021-08-11 MED ORDER — NOREPINEPHRINE 4 MG/250ML-% IV SOLN
2.0000 ug/min | INTRAVENOUS | Status: DC
Start: 2021-08-11 — End: 2021-08-11
  Administered 2021-08-11: 10 ug/min via INTRAVENOUS
  Filled 2021-08-11 (×2): qty 250

## 2021-08-11 MED ORDER — SODIUM BICARBONATE 8.4 % IV SOLN
INTRAVENOUS | Status: AC | PRN
  Administered 2021-08-11: 50 meq via INTRAVENOUS

## 2021-08-11 MED ORDER — POTASSIUM CHLORIDE ER 20 MEQ PO TBCR: 20.0000 meq | EXTENDED_RELEASE_TABLET | Freq: Every day | ORAL | 0 refills | Status: AC

## 2021-08-11 MED ORDER — KETAMINE HCL 50 MG/5ML IJ SOSY
PREFILLED_SYRINGE | INTRAMUSCULAR | Status: AC
  Filled 2021-08-11: qty 5

## 2021-08-11 MED ORDER — SODIUM CHLORIDE 0.9 % IV BOLUS (SEPSIS)
30.0000 mL/kg | Freq: Once | INTRAVENOUS | Status: AC
  Administered 2021-08-11: 2574 mL via INTRAVENOUS

## 2021-08-11 MED ORDER — NOREPINEPHRINE 16 MG/250ML-% IV SOLN
0.0000 ug/min | INTRAVENOUS | Status: DC
  Administered 2021-08-12: 80 ug/min via INTRAVENOUS
  Filled 2021-08-11 (×2): qty 250

## 2021-08-11 MED ORDER — HYDROCORTISONE SOD SUC (PF) 100 MG IJ SOLR
100.0000 mg | Freq: Once | INTRAMUSCULAR | Status: AC
  Administered 2021-08-11: 100 mg via INTRAVENOUS
  Filled 2021-08-11: qty 2

## 2021-08-11 MED ORDER — SUCCINYLCHOLINE CHLORIDE 200 MG/10ML IV SOSY
PREFILLED_SYRINGE | INTRAVENOUS | Status: AC
  Filled 2021-08-11: qty 10

## 2021-08-11 MED ORDER — LACTATED RINGERS IV BOLUS
1000.0000 mL | Freq: Once | INTRAVENOUS | Status: AC
Start: 2021-08-11 — End: 2021-08-11
  Administered 2021-08-11: 1000 mL via INTRAVENOUS

## 2021-08-11 MED ORDER — DEXTROSE 50 % IV SOLN: 0.0000 mL | INTRAVENOUS | Status: DC | PRN

## 2021-08-11 MED ORDER — SODIUM BICARBONATE 8.4 % IV SOLN
INTRAVENOUS | Status: AC | PRN
  Administered 2021-08-11 (×2): 50 meq via INTRAVENOUS

## 2021-08-11 MED ORDER — DEXTROSE IN LACTATED RINGERS 5 % IV SOLN: INTRAVENOUS | Status: DC

## 2021-08-11 MED ORDER — LEVETIRACETAM IN NACL 1000 MG/100ML IV SOLN
1000.0000 mg | Freq: Once | INTRAVENOUS | Status: AC
  Administered 2021-08-11: 1000 mg via INTRAVENOUS

## 2021-08-11 MED ORDER — VANCOMYCIN VARIABLE DOSE PER UNSTABLE RENAL FUNCTION (PHARMACIST DOSING): Status: DC

## 2021-08-11 MED ORDER — FENTANYL CITRATE PF 50 MCG/ML IJ SOSY: 50.0000 ug | PREFILLED_SYRINGE | Freq: Once | INTRAMUSCULAR | Status: DC

## 2021-08-11 MED ORDER — STERILE WATER FOR INJECTION IV SOLN
INTRAVENOUS | Status: DC
  Filled 2021-08-11: qty 1000

## 2021-08-11 MED ORDER — VASOPRESSIN 20 UNITS/100 ML INFUSION FOR SHOCK
0.0000 [IU]/min | INTRAVENOUS | Status: DC
  Administered 2021-08-11: 0.03 [IU]/min via INTRAVENOUS

## 2021-08-11 MED ORDER — ATROPINE SULFATE 1 MG/ML IV SOLN
INTRAVENOUS | Status: AC | PRN
  Administered 2021-08-11: 1 mg via INTRAVENOUS

## 2021-08-11 MED ORDER — SODIUM CHLORIDE 0.9 % IV BOLUS
2000.0000 mL | Freq: Once | INTRAVENOUS | Status: DC
Start: 2021-08-11 — End: 2021-08-11

## 2021-08-11 NOTE — Code Documentation (Signed)
EDP inserting right femoral central line. Critical care MD at bedside preparing for arterial line insertion

## 2021-08-11 NOTE — ED Provider Notes (Signed)
[facility] EMERGENCY DEPARTMENT Provider Note   CSN: [id number] Arrival date & time: 08/16/2021  0174  An emergency department physician performed an initial assessment on this suspected stroke patient at 1825.  History Chief Complaint  Patient presents with   Altered Mental Status    Eric Wade is a 80 y.o. male.  80 y.o male with a PMH of DM, HTN, presents to the ED status post syncopal episode while at [facility].  Patient was ambulating back from the bathroom at [facility], when suddenly he fell to the ground, according to his wife he was able to tell her that he needed to stand up, when EMS arrived, his speech became dysarthric, he began to look "disoriented ", according to daughter [name].  In addition, patient did have some left facial droop, this progressed as he was transferred to the ED.  She also has significant drop in his blood pressure with a systolic in the 94W.  He also had a seizure episode with left gaze deviation, lasted several minutes.  Able to provide any history, collateral information obtained from daughter at the bedside.  The history is provided by the patient, a relative and medical records.  Altered Mental Status Presenting symptoms: unresponsiveness       Past Medical History:  Diagnosis Date   Allergic rhinitis 08/25/2016   DIABETES MELLITUS, TYPE II 05/08/2007   ERECTILE DYSFUNCTION 05/08/2007   GOUT 07/01/2007   HYPERLIPIDEMIA 05/08/2007   HYPERTENSION 05/08/2007   OBESITY 05/08/2007   Preventative health care 12/28/2010   Prostate cancer ([employer]) 07/02/2008   Qualifier: Diagnosis of  By: Jenny Reichmann MD, Hunt Oris    PSA, INCREASED 07/02/2008   RENAL INSUFFICIENCY 07/01/2007   Seasonal allergies 12/31/2018    Patient Active Problem List   Diagnosis Date Noted   Nonspecific abnormal electrocardiogram (ECG) (EKG)    Hyperglycemia 03/11/2020   Bradycardia 03/11/2020   Diabetic ulcer of ankle ([place]) 02/21/2019   AMS (altered mental status)  02/21/2019   Neurology follow-up encounter 02/20/2019   Episodic altered awareness 02/20/2019   Acute kidney injury superimposed on chronic kidney disease ([place]) 02/20/2019   Normocytic anemia 02/20/2019   Increased ammonia level 01/10/2019   Hypokalemia    Hypophosphatemia    Acute metabolic encephalopathy    Encephalopathy acute    Stroke ([place]) 12/28/2018   Allergic rhinitis 08/25/2016   Preventative health care 12/28/2010   Prostate cancer ([place]) 07/02/2008   GOUT 07/01/2007   CKD (chronic kidney disease) stage 3, GFR 30-59 ml/min ([place]) 07/01/2007   Insulin dependent diabetes mellitus 05/08/2007   Hyperlipidemia 05/08/2007   OBESITY 05/08/2007   ERECTILE DYSFUNCTION 05/08/2007   Essential hypertension 05/08/2007    No past surgical history on file.     Family History  Problem Relation Age of Onset   Hypertension Other     Social History   Tobacco Use   Smoking status: Former   Smokeless tobacco: Never  Scientific laboratory technician Use: Never used  Substance Use Topics   Alcohol use: No   Drug use: No    Home Medications Prior to Admission medications   Medication Sig Start Date End Date Taking? Authorizing Provider  albuterol (VENTOLIN HFA) 108 (90 Base) MCG/ACT inhaler Inhale 2 puffs into the lungs every 4 (four) hours as needed for wheezing or shortness of breath. 12/01/19  Yes [provider]  amLODipine (NORVASC) 5 MG tablet Take 1 tablet (5 mg total) by mouth daily. 03/12/20 08/26/2021 Yes Gherghe, Costin  M, MD  atorvastatin (LIPITOR) 20 MG tablet TAKE 1 TABLET (20 MG TOTAL) BY MOUTH DAILY AT 6 PM. Patient taking differently: Take 20 mg by mouth at bedtime. 05/13/20  Yes [name], MD  furosemide (LASIX) 40 MG tablet Take 40 mg by mouth in the morning and at bedtime.   Yes [provider]  hydrALAZINE (APRESOLINE) 50 MG tablet TAKE 1 & 1/2 TABLET EVERY 6 HOURS Patient taking differently: Take 75 mg by mouth every 6 (six) hours. 06/14/20  Yes [name], MD  insulin aspart (NOVOLOG) 100 UNIT/ML FlexPen 0-30 Units 3 (three) times daily with meals. Max units in a day-30-sliding scale 10/28/20  Yes [provider]  insulin glargine (LANTUS SOLOSTAR) 100 UNIT/ML Solostar Pen Inject 20 Units into the skin every evening. 20 units at 1700 10/28/20  Yes [provider]  potassium chloride SA (KLOR-CON) 20 MEQ tablet Take 1 tablet (20 mEq total) by mouth 2 (two) times daily. 07/17/21  Yes Charlesetta Shanks, MD  tamsulosin (FLOMAX) 0.4 MG CAPS capsule Take 1 capsule (0.4 mg total) by mouth at bedtime. 03/12/20  Yes Caren Griffins, MD  Accu-Chek FastClix Lancets MISC USE TO CHECK BLOOD SUGAR UP TO 4 TIMES A DAY (E11.9) 10/07/19   [name], MD  ACCU-CHEK GUIDE test strip USE TO CHECK BLOOD SUGAR UP TO 4 TIMES A DAY. 03/26/20   [name], MD  blood glucose meter kit and supplies KIT Dispense based on patient and insurance preference. Use up to four times daily as directed. (FOR ICD-9 250.00, 250.01). 01/03/19   Florencia Reasons, MD  potassium chloride 20 MEQ TBCR Take 20 mEq by mouth daily. Patient not taking: Reported on 08/23/2021 08/21/2021   Horton, Kristie M, DO  TRULICITY 1.5 BO/1.7PZ SOPN Inject 0.5 mLs into the skin once a week. Tuesdays 07/16/21   [provider]    Allergies    Nsaids  Review of Systems   Review of Systems  Unable to perform ROS: Patient unresponsive   Physical Exam Updated Vital Signs BP (!) 79/49   Pulse 87   Resp (!) 34   Wt 85.8 kg   SpO2 100%   BMI 24.29 kg/m   Physical Exam Vitals and nursing note reviewed.  HENT:     Head: Normocephalic and atraumatic.  Eyes:     Comments: Pupils are pinpoint and fixated.  Cardiovascular:     Rate and Rhythm: Normal rate.    ED Results / Procedures / Treatments   Labs (all labs ordered are listed, but only abnormal results are displayed) Labs Reviewed  CBC - Abnormal; Notable for the following components:      Result Value   WBC 12.4 (*)     All other components within normal limits  DIFFERENTIAL - Abnormal; Notable for the following components:   Neutro Abs 8.3 (*)    All other components within normal limits  COMPREHENSIVE METABOLIC PANEL - Abnormal; Notable for the following components:   Glucose, Bld 563 (*)    BUN 25 (*)    Creatinine, Ser 2.53 (*)    Albumin 3.4 (*)    GFR, Estimated 25 (*)    All other components within normal limits  I-STAT CHEM 8, ED - Abnormal; Notable for the following components:   BUN 26 (*)    Creatinine, Ser 2.50 (*)    Glucose, Bld 497 (*)    All other components within normal limits  CBG MONITORING, ED -  Abnormal; Notable for the following components:   Glucose-Capillary 509 (*)    All other components within normal limits  RESP PANEL BY RT-PCR (FLU A&B, COVID) ARPGX2  PROTIME-INR  APTT  LACTIC ACID, PLASMA  LACTIC ACID, PLASMA  URINALYSIS, ROUTINE W REFLEX MICROSCOPIC  BLOOD GAS, VENOUS  I-STAT ARTERIAL BLOOD GAS, ED  TROPONIN I (HIGH SENSITIVITY)    EKG EKG Interpretation  Date/Time:  Monday August 11 2021 19:06:47 EST Ventricular Rate:  95 PR Interval:    QRS Duration: 134 QT Interval:  373 QTC Calculation: 469 R Axis:   29 Text Interpretation: Atrial fibrillation Right bundle branch block Confirmed by Dorie Rank [phone]) on 08/05/2021 7:09:13 PM  Radiology CT Head Wo Contrast  Result Date: 08/27/2021 CLINICAL DATA:  Head trauma, minor. Additional history provided: Syncopal episode. EXAM: CT HEAD WITHOUT CONTRAST TECHNIQUE: Contiguous axial images were obtained from the base of the skull through the vertex without intravenous contrast. COMPARISON:  Head CT 02/20/2019. FINDINGS: Brain: Mild-to-moderate generalized cerebral atrophy. Known chronic lacunar infarcts within the bilateral deep gray nuclei and within the pons, some of which were better appreciated on the brain MRI of 12/29/2018. Moderate patchy and ill-defined hypoattenuation within the cerebral white matter,  nonspecific but compatible with chronic small vessel ischemic disease. There is no acute intracranial hemorrhage. No demarcated cortical infarct. No extra-axial fluid collection. No evidence of an intracranial mass. No midline shift. Vascular: No hyperdense vessel.  Atherosclerotic calcifications. Skull: Normal. Negative for fracture or focal lesion. Sinuses/Orbits: Visualized orbits show no acute finding. Trace mucosal thickening within the bilateral ethmoid and right maxillary sinuses. IMPRESSION: No evidence of acute intracranial abnormality. Moderate chronic small vessel ischemic changes within the cerebral white matter. Known chronic lacunar infarcts within the bilateral deep gray nuclei and within the pons, some of which were better appreciated on the brain MRI of 12/29/2018. Mild-to-moderate generalized cerebral atrophy. Electronically Signed   By: Kellie Simmering D.O.   On: 08/20/2021 13:18   DG Pelvis Portable  Result Date: 08/04/2021 CLINICAL DATA:  Fall EXAM: PORTABLE PELVIS 1-2 VIEWS COMPARISON:  None. FINDINGS: Possible irregularity involving the left femoral neck, raising concern for a transcervical left hip fracture. Right hip is intact.  Bowel hip joint space are preserved. Visualized bony pelvis appears intact. Degenerative changes of the lower lumbar spine. IMPRESSION: Suspected transcervical left hip fracture. Dedicated left hip radiographs are suggested. Electronically Signed   By: Julian Hy M.D.   On: 08/24/2021 19:46   CT C-SPINE NO CHARGE  Result Date: 08/25/2021 CLINICAL DATA:  Code stroke. EXAM: CT CERVICAL SPINE WITHOUT CONTRAST TECHNIQUE: Multidetector CT imaging of the cervical spine was performed without intravenous contrast. Multiplanar CT image reconstructions were also generated. COMPARISON:  None. FINDINGS: Alignment: No malalignment. Skull base and vertebrae: No fracture or focal bone lesion. Soft tissues and spinal canal: No regional soft tissue abnormality seen.  Disc levels: Foramen magnum is widely patent. Ordinary arthritic change at the C1-2 articulation but without encroachment upon the neural structures. C2-3: Normal interspace. C3-4: Bulging of the disc. Congenitally small canal. Mild multifactorial stenosis. C4-5: Bulging of the disc. Congenitally small canal. Moderate multifactorial stenosis. Bony foraminal stenosis on the right. C5-6: Endplate osteophytes and bulging of the disc. Congenitally small canal. Moderate to severe multifactorial stenosis. Bilateral foraminal stenosis right worse than left. C6-7: Endplate osteophytes and bulging of the disc. Mild bilateral bony foraminal stenosis. C7-T1: Mild bulging of the disc.  No stenosis. Upper chest: Emphysema and scarring. Other: None IMPRESSION: Congenitally small  spinal canal. Superimposed spondylosis. Canal stenosis most pronounced at C4-5 and C5-6 and to a lesser extent at C3-4 and C6-7. Bony foraminal stenoses as outlined above with potential for additional neural compression. Electronically Signed   By: Nelson Chimes M.D.   On: 08/30/2021 19:22   DG Chest Port 1 View  Result Date: 08/13/2021 CLINICAL DATA:  Syncope, fall EXAM: PORTABLE CHEST 1 VIEW COMPARISON:  07/17/2021 FINDINGS: Lungs are clear.  No pleural effusion or pneumothorax. The heart is normal in size.  Thoracic aortic atherosclerosis. IMPRESSION: No evidence of acute cardiopulmonary disease. Electronically Signed   By: Julian Hy M.D.   On: 08/13/2021 19:44   DG Knee Left Port  Result Date: 08/23/2021 CLINICAL DATA:  Fall EXAM: PORTABLE LEFT KNEE - 1-2 VIEW COMPARISON:  None. FINDINGS: No fracture or dislocation is seen. The joint spaces are preserved. Visualized soft tissues are within normal limits. IMPRESSION: Negative. Electronically Signed   By: Julian Hy M.D.   On: 08/08/2021 19:44   CT HEAD CODE STROKE WO CONTRAST  Result Date: 08/04/2021 CLINICAL DATA:  Code stroke. New no deficit, acute, stroke suspected.  EXAM: CT HEAD WITHOUT CONTRAST TECHNIQUE: Contiguous axial images were obtained from the base of the skull through the vertex without intravenous contrast. COMPARISON:  Earlier same day FINDINGS: Brain: Chronic small-vessel ischemic changes affect the pons. No focal cerebellar finding. Chronic small-vessel ischemic changes affect the thalami, basal ganglia and cerebral hemispheric white matter. No cortical or large vessel territory infarction. No mass lesion, hemorrhage, hydrocephalus or extra-axial collection. Vascular: There is atherosclerotic calcification of the major vessels at the base of the brain. Skull: Negative Sinuses/Orbits: Clear/normal Other: None ASPECTS ([place] Stroke Program Early CT Score) - Ganglionic level infarction (caudate, lentiform nuclei, internal capsule, insula, M1-M3 cortex): 7 - Supraganglionic infarction (M4-M6 cortex): 3 Total score (0-10 with 10 being normal): 10 IMPRESSION: 1. No change since earlier today. Atrophy and chronic small-vessel ischemic changes throughout the brain as outlined above. 2. ASPECTS is 10 3. These results were communicated to Dr. Rory Percy at [facility] pm on 08/20/2021 by text page via the [facility] messaging system. Electronically Signed   By: Nelson Chimes M.D.   On: 08/15/2021 18:43   CT ANGIO HEAD NECK W WO CM W PERF (CODE STROKE)  Result Date: 08/17/2021 CLINICAL DATA:  Neuro deficit, acute, stroke suspected EXAM: CT ANGIOGRAPHY HEAD AND NECK TECHNIQUE: Multidetector CT imaging of the head and neck was performed using the standard protocol during bolus administration of intravenous contrast. Multiplanar CT image reconstructions and MIPs were obtained to evaluate the vascular anatomy. Carotid stenosis measurements (when applicable) are obtained utilizing NASCET criteria, using the distal internal carotid diameter as the denominator. CONTRAST:  54m OMNIPAQUE IOHEXOL 350 MG/ML SOLN COMPARISON:  Head CT earlier same day FINDINGS: CTA NECK FINDINGS Aortic arch:  Aortic atherosclerosis. Branching pattern is normal without origin stenosis. Right carotid system: Common carotid artery widely patent to the bifurcation. Mild calcified plaque at the carotid bifurcation and ICA bulb but no stenosis. Cervical ICA widely patent. Left carotid system: Common carotid artery widely patent to the bifurcation. Mild calcified plaque at the carotid bifurcation but no stenosis. Cervical ICA widely patent. Vertebral arteries: Both vertebral artery origins are widely patent. Both vertebral arteries appear normal through the cervical region to the foramen magnum. Skeleton: Cervical spondylosis with spinal stenosis as described at cervical spine CT. Other neck: No mass or lymphadenopathy. Upper chest: Emphysema and pulmonary scarring.  No acute process. Review of the MIP  images confirms the above findings CTA HEAD FINDINGS Anterior circulation: Both internal carotid arteries are patent through the skull base and siphon regions. There is mild siphon atherosclerotic calcification but no stenosis greater than 30%. The anterior and middle cerebral vessels are patent without large vessel occlusion, proximal stenosis, aneurysm or vascular malformation. Some atherosclerotic irregularity of the more distal MCA branch vessels. Posterior circulation: Both vertebral arteries are widely patent through the foramen magnum to the basilar. Mild stenosis of the proximal basilar artery, estimated at 30%. Beyond that the basilar artery is widely patent. Superior cerebellar and posterior cerebral arteries show flow. There is moderate stenosis of the left P2 segment. Venous sinuses: Patent and normal. Anatomic variants: None significant. Review of the MIP images confirms the above findings IMPRESSION: Aortic atherosclerosis. Minimal carotid bifurcation atherosclerosis but no stenosis. Minimal siphon atherosclerotic calcification but without stenosis greater than 30%. Estimated 30% stenosis of the proximal basilar  artery. Atherosclerotic irregularity of the more distal intracranial branch vessels, including within both MCA territories. Moderate stenosis of the left PCA P2 segment. Electronically Signed   By: Nelson Chimes M.D.   On: 08/02/2021 19:19    Procedures .Critical Care Performed by: Janeece Fitting, PA-C Authorized by: Janeece Fitting, PA-C   Critical care provider statement:    Critical care time (minutes):  23 (35)   Critical care start time:  08/23/2021 7:30 PM   Critical care end time:  08/22/2021 8:15 PM   Critical care was necessary to treat or prevent imminent or life-threatening deterioration of the following conditions:  Sepsis   Critical care was time spent personally by me on the following activities:  Examination of patient, evaluation of patient's response to treatment, discussions with primary provider and discussions with consultants   Care discussed with: admitting provider     Medications Ordered in ED Medications  sodium chloride flush (NS) 0.9 % injection 3 mL (3 mLs Intravenous Not Given 08/10/2021 1910)  lactated ringers infusion (has no administration in time range)  sodium chloride 0.9 % bolus 2,574 mL (2,574 mLs Intravenous New Bag/Given 08/21/2021 1913)  levETIRAcetam (KEPPRA) IVPB 1000 mg/100 mL premix (1,000 mg Intravenous New Bag/Given 08/13/2021 1900)  LORazepam (ATIVAN) injection 1 mg (1 mg Intravenous Given 08/06/2021 1900)  iohexol (OMNIPAQUE) 350 MG/ML injection 75 mL (75 mLs Intravenous Contrast Given 08/14/2021 1909)    ED Course  I have reviewed the triage vital signs and the nursing notes.  Pertinent labs & imaging results that were available during my care of the patient were reviewed by me and considered in my medical decision making (see chart for details).  Clinical Course as of 08/08/2021 2351  Mon Aug 11, 2021  1850 Glucose(!): 497 [JS]  1937 Creatinine(!): 2.50 Worsen than his baseline [JS]  2051 pH, Arterial(!): 7.221 [JS]    Clinical Course User  Index [JS] Janeece Fitting, PA-C   MDM Rules/Calculators/A&P   Patient arrived to the ED via EMS status post syncopal episode while at [facility].  Also had an episode of slurred speech, dysarthria.  Patient has continued with slurred speech, he was taken to CT where he was noted to be hypotensive.  Evaluated by neurology, also had an episode of seizure-like activity with left gaze deviation.  Patient was evaluated in the emergency department and disposition home around 4 PM today.  He continues to be hypotensive, evolving sepsis protocol has been started.  He continues to be hypotensive with a pressure in the 83T systolic.  He does not answer any  questions at this time, seems to be postictal, was given IV Ativan after seizure-like activity.  7:01 PM Call placed to daughter [name], who was informed of her father status at this time.  She reports he has not been recently sick with anything, has not run any fevers or complained of any pain.  Patient did have elevated blood pressures, elevated blood sugars at PCPs appointment the last week.  Labs on today's visit, with increase in glucose at 563, creatinine level is 2.5, this has worsened significantly since 3 weeks ago and since this morning.  LFTs are within normal limits.  BC with a leukocytosis of 12.4, suspect likely reactive.  PT INR within normal limits.  CT Angio showed head and neck:  1. No change since earlier today. Atrophy and chronic small-vessel  ischemic changes throughout the brain as outlined above.  2. ASPECTS is 10  3. These results were communicated to Dr. Rory Percy at [facility] pm on  08/18/2021 by text page via the [facility] messaging system.         Patient continue to receive multiple rounds of fluid resuscitation including approximately 3 L.  In addition, he was placed on Levophed, continued to feel response to resuscitation.  Blood pressure trending down with a systolic in the 16X, pulse was lost, and had 1 round of CPR, given atropine.   Pulses regained, suspicion for septic shock at this time, unknown source.  Antibiotics were ordered for patient while resuscitating him in the emergency department.  Extensive chart review, reveals patient previously seen earlier today in the emergency department, given IV potassium,   I spoke to critical care physician Dr. Patsey Berthold, critical care physician who will evaluate patient in the ED.   Patient also wants pulses again, another round of epinephrine was also given.  Code was run by my attending Dr. Tomi Bamberger.  Family such as daughter [name] was communicated of incident at this time.  Patient transferred to the floor in order to have further management.    Portions of this note were generated with Lobby[employer]. Dictation errors may occur despite best attempts at proofreading.  Final Clinical Impression(s) / ED Diagnoses Final diagnoses:  Fall  Altered mental status, unspecified altered mental status type    Rx / DC Orders ED Discharge Orders     None        Janeece Fitting, PA-C 08-15-21 0005    Dorie Rank, MD 08-15-21 [phone]

## 2021-08-11 NOTE — Code Documentation (Signed)
Stroke Response Nurse Documentation Code Documentation  Eric Wade is a 80 y.o. male arriving to [facility] ED via [facility] on 08/04/2021 with past medical hx of HTN, HLD, DM, previous CVA . On No antithrombotic. Code stroke was activated by [facility]. CBG 400's, BP 130's.   Patient from [facility] where he was LKW at 1759 and now complaining of syncopal episode, left leg pain, AMS. Pt was seen at hospital this am after an AMS episode where he was found staring with food in his mouth by daughter. He was discharged. Pt was then at [facility] with daughter and fell landing on his left side. EMS arrived and activated a code stroke. Pt taken to [facility].   Stroke team at the bedside on patient arrival. Labs drawn and patient cleared for CT by Dr. Tomi Bamberger. Patient to CT with team. NIHSS 4, see documentation for details and code stroke times. Patient with disoriented, left leg weakness, and dysarthria  on exam but verbal and following commands. The following imaging was completed:   CT, CTA head and neck. Pt's blood sugar in CT 509 and BP now in the 70's. Pt had a 15 second seizure when moving him back over to the stretcher. He had a stiff arm, incontinence episode, and forced left gaze. Pt not responding verbally. Pt rushed back to room. 1L bolus NS started, 1g keppra, 1mg  Ativan given. Family now at bedside and updated by Neurologist. Pt is clammy and placed on NRB.   Patient is not a candidate for IV Thrombolytic due to stroke not suspected. . Patient is not a candidate for IR due to no LVO.   Care/Plan: MRI, EEG, medical management.   Bedside handoff with ED RN Margreta Journey.    Tanee Henery, Rande Brunt  Stroke Response RN

## 2021-08-11 NOTE — Progress Notes (Signed)
Pharmacy Antibiotic Note  Eric Wade is a 80 y.o. male admitted on 08/25/2021 with sepsis.  Pharmacy has been consulted for cefepime and vancomycin dosing.  Patient with a history of diabetes, hypertension, hyperlipidemia, prostate cancer, stroke in 2020 with left hemiparesis with mild left-sided weakness.   Patient's serum creatinine is 2.5 which is significantly above baseline. WBC 12.4; T 97.6 F  Plan: Vancomycin 1750 mg once, subsequent dosing as indicated per random vancomycin level until renal function stable and/or improved, at which time scheduled dosing can be considered Trend WBC, fever, renal function, and clinical course Follow-up cultures and de-escalate antibiotics as appropriate.  Weight: 85.8 kg (189 lb 2.5 oz)  Temp (24hrs), Avg:97.6 F (36.4 C), Min:97.6 F (36.4 C), Max:97.6 F (36.4 C)  Recent Labs  Lab 08/13/2021 1035 08/09/2021 1825 08/18/2021 1832  WBC 12.9* 12.4*  --   CREATININE 2.19* 2.53* 2.50*    Estimated Creatinine Clearance: 26.5 mL/min (A) (by C-G formula based on SCr of 2.5 mg/dL (H)).    Allergies  Allergen Reactions   Nsaids     REACTION: renal insufficiency    Antimicrobials this admission: cefepime 12/12 >>  vancomycin 12/12 >>  Microbiology results: Pending  Thank you for allowing pharmacy to be a part of this patient's care.  Lorelei Pont, PharmD, BCPS 08/21/2021 8:35 PM ED Clinical Pharmacist -  [phone]

## 2021-08-11 NOTE — ED Notes (Signed)
Pt verbalizes understanding of discharge instructions. Opportunity for questions and answers were provided. Pt discharged from the ED.   ?

## 2021-08-11 NOTE — Sepsis Progress Note (Signed)
Following per sepsis protocol   

## 2021-08-11 NOTE — Progress Notes (Signed)
[facility] Progress Note Patient Name: Eric Wade DOB: 10-Oct-1940 MRN: [id number]   Date of Service  08/19/2021  HPI/Events of Note  Patient originally admitted with suspected code stroke, however he subsequently became hypotensive in  the ED, raising the possibility of septic shock. He is intubated and mechanically ventilated.  eICU Interventions  New Patient Evaluation.        [name] Deolinda Frid 08/20/2021, 11:40 PM

## 2021-08-11 NOTE — Progress Notes (Signed)
Chaplain responded to Code Blue; met pt's two daughters in family waiting area; Chaplain offered care and support and something to drink.  Daughters denied needing Chaplain assitance at this time.  Chaplain notified medical staff where family is waiting. Chaplain standing by if needed for further assistance.De Burrs Chaplain

## 2021-08-11 NOTE — ED Notes (Signed)
EEG tech at bedside. 

## 2021-08-11 NOTE — Sepsis Progress Note (Signed)
Spoke with ER nurse via secure chat, pt coded multiple times, labs were not drawn at that time as a result.  Nurse will contact ICU RN to relay need to draw blood cultures and lactic acid.

## 2021-08-11 NOTE — Progress Notes (Signed)
EEG complete - results pending 

## 2021-08-11 NOTE — ED Triage Notes (Signed)
Pt BIB [facility] from home, pt was eating breakfast and had a syncopal episode lasting about 1 min, witnessed by daughter. After episode, pt was lethargic, not answering questions appropriately. Pt has a hx of stroke about a year ago. Pt CBG 352 with EMS, hx diabetes. Pt a/ox4 at this time. Initial BP 160/90, most recent 130/70

## 2021-08-11 NOTE — ED Notes (Signed)
Started 1L NS bolus at 1900 per MD verbal order

## 2021-08-11 NOTE — ED Triage Notes (Signed)
Brought by EMS from [facility] where pt had a syncopal episode and fell. A/O on EMS arrival and speech was clear. Pt then had a witnessed seizure-like episode with EMS present and recovered presenting with left-sided facial droop and slurred speech per EMS. Facial droop resolved within a few minutes, slurred speech continued. Arrived to ED and was taken to CT2 where pt had a sudden and significant drop in BP, dropping to 74/49 (55) verified x 3. After CTA was completed, pt then had another witnessed seizure-like episode with left gaze deviation, no verbal response, no withdrawal from pain from stimuli. This lasted for several minutes with MD and nurses present before pt recovered enough to begin following commands. Incontinent of stool. Required placement on NRB due to poor O2 saturation after episode in CT.

## 2021-08-11 NOTE — ED Notes (Signed)
Critical glucose 563, consistent with CBG obtained on arrival. MD notified.

## 2021-08-11 NOTE — Discharge Instructions (Addendum)
You have been seen and discharged from the emergency department.  Your blood work showed a mild hypokalemia but otherwise was normal for you.  Head CT was unchanged.  Take potassium supplement starting tomorrow at 12/13 daily as prescribed.  Follow-up next week for outpatient repeat blood work and potassium level.  Follow-up with your primary provider for reevaluation and further care. Take home medications as prescribed. If you have any worsening symptoms or further concerns for your health please return to an emergency department for further evaluation.

## 2021-08-11 NOTE — H&P (Signed)
NAME:  Eric Wade, MRN:  [id number], DOB:  02-20-1941, LOS: 0 ADMISSION DATE:  08/03/2021, CONSULTATION DATE:  08/05/2021 REFERRING MD:  Scot Jun, CHIEF COMPLAINT:  Altered mental status  History of Present Illness:  Mr. Staller is an 80 year old man with a history of HTN, kidney failure, who initially presented this am to ED for syncope witnessed by daughter, lasted 1 min.  Alert and oriented back to baseline following initial syncope. AKI on CKD (Cr 2.2 from 1.4/1.5).  History mainly from review of chart and some from his daughters at bedside.  Was feeling unwell last week, fatigued  Syncope with food in his mouth this am.  Discharged from ED  at 430 pm.   Returned to ED after another syncopal episodes, which occurred while walking in [employer].   Per his daughter he appeared at his baseline until he suddenly lost consciousness.  Family denies he was having any other specific symptoms, including dysuria, fevers, URI symptoms, shortness of breath, chest pain.   Dysarthria on arrival of EMS.   In ED on arrival facial droop had resolved still had dysarthria. L leg pain also documented.  In ED at head CT had 15 second episode of L gaze and L arm stiffening, then unresponsive with gradual return of spontaneous movements and increased wakefulness (neuro concerned for seizure),  also concurrently had low BP with MAP 55.  Given ativan 22m.  Another episode of this apparent seizure like activity  for several minutes, then started waking up again.   BP fluctuating from 140s/150s down to 60s per neuro note.    First BP low noted after the seizure.   Given keppra 1g  in ED.    Stat EEG done - negative  Keppra 500 BID rec by neuro Given Bolus 2.5 L total in ED.  Started levophed for hypotension.   Persistent hypotension despite up titration of levophed.    IN ED developed Cardiac arrest.   Two somewhat short codes prior to rosc (see ed documentation) Intubated periarrest.    ON my arrival,  developed another episode of cardiac arrest. PEA.   Epi x 2, bicarb x 2 and calcium given.  ROSC after 2 rounds epi,  <10 min by my estimation (see code documentation sheet) Remained hypotensive but able to place arterial line and give pressors via central line that was placed by the ED physician.   Antibiotics had been ordered in the ED, he received cefepime , flagyl; vancomycin was also ordered.    Central and art line placed In ED.  Initially more stable on levo 20 and epi 10, then dropped his BP severely on arrival  in ICU to 430Qsystolic despite bicarb push, high doses pressors.  Gave 243mepi over course of several minutes, BP was adequate for > hour following that, though remained on very high doses of pressors.   Bedside echo revealed no tamonade, global hypokinesis at the time of severe hypotension.   WBC 12 Hb 12 -->10  Anion gap  10--> 12 (prior 8) Viral panel negative  Atrial fibrillation, frequent PVCs, in the Evening EKG   CT head   IMPRESSION: No evidence of acute intracranial abnormality. Moderate chronic small vessel ischemic changes within the cerebral white matter. Known chronic lacunar infarcts within the bilateral deep gray nuclei and within the pons, some of which were better appreciated on the brain MRI of 12/29/2018 Mild-to-moderate generalized cerebral atrophy.  CT cervical spine, CTA were also done and negative  per report.  They are not showing up in my results but the results were sent to me by ED physician.     Pelvic xray: IMPRESSION: Suspected transcervical left hip fracture. Dedicated left hip radiographs are suggested.    Pertinent  Medical History  HTN DMII Gout HLD Prostate Ca, s/p resection 2014, adenocarcinoma CKD stage 3 Stroke hx 2021 L hemiparesis L sided deficits    Meds: norvasc, lasix, kcl Lipitor, albuterol, hydralazine, lantus, insulin, flomax, trulicity,  Significant Hospital Events: Including procedures, antibiotic start and  stop dates in addition to other pertinent events     Interim History / Subjective:    Objective   Blood pressure (!) 42/34, pulse (!) 0, resp. rate (!) 32, weight 85.8 kg, SpO2 (!) 88 %.    Vent Mode: PRVC FiO2 (%):  [100 %] 100 % Set Rate:  [20 bmp-28 bmp] 28 bmp Vt Set:  [550 mL] 550 mL PEEP:  [5 cmH20] 5 cmH20   Intake/Output Summary (Last 24 hours) at 08/24/2021 2304 Last data filed at 08/18/2021 2105 Gross per 24 hour  Intake 2674 ml  Output --  Net 2674 ml   Filed Weights   08/07/2021 1831  Weight: 85.8 kg    Examination: General: Intubated, non responsive  HENT: PERRL, ,mmm Lungs: CTAB Cardiovascular: RRR no mgr  Abdomen: Nt, ND, NBS  Extremities: no edema, no erythema  Neuro: Non responsive, breathing over ventilator  GU:  Old echo from 11/2018:  1. The left ventricle has normal systolic function with an ejection  fraction of 60-65%. The cavity size was normal. Left ventricular diastolic  Doppler parameters are consistent with impaired relaxation.   2. The right ventricle has normal systolic function. The cavity was  normal. There is no increase in right ventricular wall thickness.   3. No stenosis of the aortic valve.   4. The interatrial septum was not assessed.   Resolved Hospital Problem list     Assessment & Plan:  Hypotension:  Cause possibly 2/2 sepsis, lactic acidosis likely contributing significantly later in the course.  His UA eventually came back positive for UTI.  Blood cultures and urine culture reflex were ordered.  Cefepime , flagyl, vanc for broad spectrum.  Stress dose steroids ordered and first dose given.  Possible cardiac shock, although would be an atypical presentation (no chest pain, no shortness of breath).  Sudden syncope could possibly be due to arrhythmias due to abnormal heart or ischemia, however initial 2 troponins were negative.  My only bedside echo was done when BP was very low,  hypokinesis, but could be 2/2 underlying  metabolic causes.   Unfortunately he remained on very high doses of pressors after arrival to ED.  Discussed with daughters, given poor prognosis and recurrent codes, they did not want to continue to do CPR if he should suffer another cardiac arrest.   Hb stable, no signs of bleeding.   Lactic acidosis: likely 2/2 refractory hypotension.  Consider bowel ischemia. Consider embolic causes given TIA like symptoms on presentation/seizures.    Mechanical ventilation: intubated for airway protection.    Episodes of afib -Intermittent.  No known history per my review.   Rate was controlled on my evaluation.    Hyperglycemia : possibly exacerbated during the course of his illness by epinephrine.   AG appropriately elevated by the lactate, did not appear c/w DKA.  oN insulin gtt.  AKI on CKD - Unclear cause.  Possibly underlying infection.    L hip francture -  2/2 fall [place] [facility]?    HTN DMII Gout HLD Prostate Ca, s/p resection 2014, adenocarcinoma CKD stage 3 Stroke hx 2021 L hemiparesis L sided deficits    OG tube Foley  Best Practice (right click and "Reselect all SmartList Selections" daily)   Diet/type: NPO DVT prophylaxis: prophylactic heparin  GI prophylaxis: PPI Lines: Central line Foley:  Yes, and it is still needed Code Status:  DNR Last date of multidisciplinary goals of care discussion [Spoke with daughters about not continuing CPR in the event of cardiac arrest. ]  Labs   CBC: Recent Labs  Lab 08/16/2021 1035 08/17/2021 1825 08/02/2021 1832 08/09/2021 2048 08/29/2021 2230  WBC 12.9* 12.4*  --   --   --   NEUTROABS 9.4* 8.3*  --   --   --   HGB 13.2 13.5 14.3 11.6* 10.9*  HCT 39.8 41.1 42.0 34.0* 32.0*  MCV 88.4 89.7  --   --   --   PLT 172 156  --   --   --     Basic Metabolic Panel: Recent Labs  Lab 08/24/2021 1035 08/29/2021 1825 08/20/2021 1832 08/22/2021 2048 08/15/2021 2230  NA 139 136 140 139 139  K 3.0* 3.8 3.7 3.9 4.0  CL 102 102 103  --   --   CO2 27 22  --    --   --   GLUCOSE 332* 563* 497*  --   --   BUN 20 25* 26*  --   --   CREATININE 2.19* 2.53* 2.50*  --   --   CALCIUM 9.2 9.3  --   --   --   MG 2.1  --   --   --   --    GFR: Estimated Creatinine Clearance: 26.5 mL/min (A) (by C-G formula based on SCr of 2.5 mg/dL (H)). Recent Labs  Lab 08/25/2021 1035 08/15/2021 1825 08/10/2021 2020  WBC 12.9* 12.4*  --   LATICACIDVEN  --   --  6.0*    Liver Function Tests: Recent Labs  Lab 08/13/2021 1035 08/13/2021 1825  AST 24 28  ALT 20 24  ALKPHOS 73 79  BILITOT 0.7 0.6  PROT 7.0 7.5  ALBUMIN 3.4* 3.4*   No results for input(s): LIPASE, AMYLASE in the last 168 hours. No results for input(s): AMMONIA in the last 168 hours.  ABG    Component Value Date/Time   PHART 7.123 (LL) 08/07/2021 2230   PCO2ART 45.6 08/13/2021 2230   PO2ART 87 08/21/2021 2230   HCO3 14.8 (L) 08/20/2021 2230   TCO2 16 (L) 08/23/2021 2230   ACIDBASEDEF 14.0 (H) 08/21/2021 2230   O2SAT 92.0 08/30/2021 2230     Coagulation Profile: Recent Labs  Lab 08/14/2021 1825  INR 1.0    Cardiac Enzymes: No results for input(s): CKTOTAL, CKMB, CKMBINDEX, TROPONINI in the last 168 hours.  HbA1C: Hgb A1c MFr Bld  Date/Time Value Ref Range Status  03/12/2020 05:00 AM 13.3 (H) 4.8 - 5.6 % Final    Comment:    (NOTE) Pre diabetes:          5.7%-6.4%  Diabetes:              >6.4%  Glycemic control for   <7.0% adults with diabetes   12/30/2018 02:17 AM 13.6 (H) 4.8 - 5.6 % Final    Comment:    (NOTE) Pre diabetes:          5.7%-6.4% Diabetes:              >  6.4% Glycemic control for   <7.0% adults with diabetes     CBG: Recent Labs  Lab 08/05/2021 1018 08/10/2021 1832 08/14/2021 2048 08/02/2021 2224  GLUCAP 309* 509* 568* 564*    Review of Systems:   Unable to assess  Past Medical History:  He,  has a past medical history of Allergic rhinitis (08/25/2016), DIABETES MELLITUS, TYPE II (05/08/2007), ERECTILE DYSFUNCTION (05/08/2007), GOUT (07/01/2007),  HYPERLIPIDEMIA (05/08/2007), HYPERTENSION (05/08/2007), OBESITY (05/08/2007), Preventative health care (12/28/2010), Prostate cancer ([place]) (07/02/2008), PSA, INCREASED (07/02/2008), RENAL INSUFFICIENCY (07/01/2007), and Seasonal allergies (12/31/2018).   Surgical History:  No past surgical history on file.   Social History:   reports that he has quit smoking. He has never used smokeless tobacco. He reports that he does not drink alcohol and does not use drugs.   Family History:  His family history includes Hypertension in an other family member.   Allergies Allergies  Allergen Reactions   Nsaids     REACTION: renal insufficiency     Home Medications  Prior to Admission medications   Medication Sig Start Date End Date Taking? Authorizing Provider  albuterol (VENTOLIN HFA) 108 (90 Base) MCG/ACT inhaler Inhale 2 puffs into the lungs every 4 (four) hours as needed for wheezing or shortness of breath. 12/01/19  Yes [provider]  amLODipine (NORVASC) 5 MG tablet Take 1 tablet (5 mg total) by mouth daily. 03/12/20 08/10/2021 Yes Gherghe, Vella Redhead, MD  atorvastatin (LIPITOR) 20 MG tablet TAKE 1 TABLET (20 MG TOTAL) BY MOUTH DAILY AT 6 PM. Patient taking differently: Take 20 mg by mouth at bedtime. 05/13/20  Yes [name], MD  furosemide (LASIX) 40 MG tablet Take 40 mg by mouth in the morning and at bedtime.   Yes [provider]  hydrALAZINE (APRESOLINE) 50 MG tablet TAKE 1 & 1/2 TABLET EVERY 6 HOURS Patient taking differently: Take 75 mg by mouth every 6 (six) hours. 06/14/20  Yes [name], MD  insulin aspart (NOVOLOG) 100 UNIT/ML FlexPen 0-30 Units 3 (three) times daily with meals. Max units in a day-30-sliding scale 10/28/20  Yes [provider]  insulin glargine (LANTUS SOLOSTAR) 100 UNIT/ML Solostar Pen Inject 20 Units into the skin every evening. 20 units at 1700 10/28/20  Yes [provider]  potassium chloride SA (KLOR-CON) 20 MEQ tablet Take 1 tablet (20  mEq total) by mouth 2 (two) times daily. 07/17/21  Yes Charlesetta Shanks, MD  tamsulosin (FLOMAX) 0.4 MG CAPS capsule Take 1 capsule (0.4 mg total) by mouth at bedtime. 03/12/20  Yes Caren Griffins, MD  Accu-Chek FastClix Lancets MISC USE TO CHECK BLOOD SUGAR UP TO 4 TIMES A DAY (E11.9) 10/07/19   [name], MD  ACCU-CHEK GUIDE test strip USE TO CHECK BLOOD SUGAR UP TO 4 TIMES A DAY. 03/26/20   [name], MD  blood glucose meter kit and supplies KIT Dispense based on patient and insurance preference. Use up to four times daily as directed. (FOR ICD-9 250.00, 250.01). 01/03/19   Florencia Reasons, MD  potassium chloride 20 MEQ TBCR Take 20 mEq by mouth daily. Patient not taking: Reported on 08/09/2021 08/29/2021   Horton, Kristie M, DO  TRULICITY 1.5 BU/3.8GT SOPN Inject 0.5 mLs into the skin once a week. Tuesdays 07/16/21   [provider]     Critical care time:75 minutes.

## 2021-08-11 NOTE — Progress Notes (Signed)
An USGPIV (ultrasound guided PIV) has been placed for short-term vasopressor infusion. A correctly placed ivWatch must be used when administering Vasopressors. Should this treatment be needed beyond 72 hours, central line access should be obtained.  It will be the responsibility of the bedside nurse to follow best practice to prevent extravasations.   ?

## 2021-08-11 NOTE — ED Notes (Signed)
Patient transported to CT 

## 2021-08-11 NOTE — Consult Note (Signed)
Neurology Consultation  Reason for Consult: code stroke - slurred speech Referring Physician: Dr Hillard Danker. Soto PAc  CC: Slurred speech, syncopal episodes  History is obtained from: Patient, chart  HPI: Eric Wade is a 80 y.o. male diabetes, hypertension, hyperlipidemia, prostate cancer, stroke in 2020 with left hemiparesis with mild left-sided weakness, who was seen in the emergency room this morning for concerns for syncopal episodes.  He was having breakfast and had a sudden onset of loss of consciousness and unresponsiveness and took about 5 minutes to come around.  He was seen and evaluated in the ED, basic labs done, and discharged after supportive care was provided.  [facility] had a sudden onset of loss of consciousness/passing out at around 6 PM today.  When EMTs got there, he was dysarthric.  Because it was an unwitnessed fall, they did not know if he hit his head or neck, they brought him in a c-collar. He was able to talk some and provide some history but severely dysarthric and his dysarthria was worsening. EMS noted his blood sugars to be in the 400s.  On arrival to the ER his blood fingerstick in the ER was in the 500s. CT head was done-no acute changes. CT angiography head and neck was also performed emergently to rule out LVO/basilar occlusion/vertebrobasilar insufficiency-negative for emergent LVO. As he was being prepped to be brought back to the room after CT imaging, he had a 15-second episode of leftward gaze and left arm stiffening followed by unresponsiveness and gradual return of spontaneous movements in both extremities and being more wakefulness raising concern for seizure. Before the seizure-like activity happened-patient was able to state that his last known well was sometime yesterday, upon waking up today he had started feeling unwell generally.  Daughter came in later and confirmed that he was feeling unwell since this morning when he had woken  up.  In the emergency room, strong fluctuations in his blood pressure with systolics ranging from 734L 150s to as low as 60s.   LKW: Sometime yesterday tpa given?: no, outside the window Premorbid modified Rankin scale (mRS): 2  ROS: Full ROS was performed and is negative except as noted in the HPI.   Past Medical History:  Diagnosis Date   Allergic rhinitis 08/25/2016   DIABETES MELLITUS, TYPE II 05/08/2007   ERECTILE DYSFUNCTION 05/08/2007   GOUT 07/01/2007   HYPERLIPIDEMIA 05/08/2007   HYPERTENSION 05/08/2007   OBESITY 05/08/2007   Preventative health care 12/28/2010   Prostate cancer ([place]) 07/02/2008   Qualifier: Diagnosis of  By: Jenny Reichmann MD, Hunt Oris    PSA, INCREASED 07/02/2008   RENAL INSUFFICIENCY 07/01/2007   Seasonal allergies 12/31/2018   Family History  Problem Relation Age of Onset   Hypertension Other     Social History:   reports that he has quit smoking. He has never used smokeless tobacco. He reports that he does not drink alcohol and does not use drugs.  Medications  Current Facility-Administered Medications:    lactated ringers infusion, , Intravenous, Continuous, Soto, Johana, PA-C   sodium chloride 0.9 % bolus 2,574 mL, 30 mL/kg, Intravenous, Once, Soto, Johana, PA-C, Last Rate: 2,574 mL/hr at 08/09/2021 1913, 2,574 mL at 08/05/2021 1913   sodium chloride flush (NS) 0.9 % injection 3 mL, 3 mL, Intravenous, Once, Dorie Rank, MD  Current Outpatient Medications:    albuterol (VENTOLIN HFA) 108 (90 Base) MCG/ACT inhaler, Inhale 2 puffs into the lungs every 4 (four) hours as  needed for wheezing or shortness of breath., Disp: , Rfl:    amLODipine (NORVASC) 5 MG tablet, Take 1 tablet (5 mg total) by mouth daily., Disp: 30 tablet, Rfl: 1   atorvastatin (LIPITOR) 20 MG tablet, TAKE 1 TABLET (20 MG TOTAL) BY MOUTH DAILY AT 6 PM. (Patient taking differently: Take 20 mg by mouth at bedtime.), Disp: 30 tablet, Rfl: 0   furosemide (LASIX) 40 MG tablet, Take 40 mg by mouth in the  morning and at bedtime., Disp: , Rfl:    hydrALAZINE (APRESOLINE) 50 MG tablet, TAKE 1 & 1/2 TABLET EVERY 6 HOURS (Patient taking differently: Take 75 mg by mouth every 6 (six) hours.), Disp: 540 tablet, Rfl: 1   insulin aspart (NOVOLOG) 100 UNIT/ML FlexPen, 0-30 Units 3 (three) times daily with meals. Max units in a day-30-sliding scale, Disp: , Rfl:    insulin glargine (LANTUS SOLOSTAR) 100 UNIT/ML Solostar Pen, Inject 20 Units into the skin every evening. 20 units at 1700, Disp: , Rfl:    potassium chloride SA (KLOR-CON) 20 MEQ tablet, Take 1 tablet (20 mEq total) by mouth 2 (two) times daily., Disp: 30 tablet, Rfl: 0   tamsulosin (FLOMAX) 0.4 MG CAPS capsule, Take 1 capsule (0.4 mg total) by mouth at bedtime., Disp: 30 capsule, Rfl: 0   Accu-Chek FastClix Lancets MISC, USE TO CHECK BLOOD SUGAR UP TO 4 TIMES A DAY (E11.9), Disp: 102 each, Rfl: 0   ACCU-CHEK GUIDE test strip, USE TO CHECK BLOOD SUGAR UP TO 4 TIMES A DAY., Disp: 100 strip, Rfl: 9   blood glucose meter kit and supplies KIT, Dispense based on patient and insurance preference. Use up to four times daily as directed. (FOR ICD-9 250.00, 250.01)., Disp: 1 each, Rfl: 0   potassium chloride 20 MEQ TBCR, Take 20 mEq by mouth daily. (Patient not taking: Reported on 08/28/2021), Disp: 7 tablet, Rfl: 0   TRULICITY 1.5 HY/0.7PX SOPN, Inject 0.5 mLs into the skin once a week. Tuesdays, Disp: , Rfl:    Exam: Current vital signs: BP (!) 64/49 (BP Location: Right Arm)   Pulse 96   Resp (!) 37   Wt 85.8 kg   SpO2 98%   BMI 24.29 kg/m  Vital signs in last 24 hours: Temp:  [97.6 F (36.4 C)] 97.6 F (36.4 C) (12/12 1020) Pulse Rate:  [63-96] 96 (12/12 1905) Resp:  [12-37] 37 (12/12 1905) BP: (64-166)/(49-106) 64/49 (12/12 1905) SpO2:  [90 %-100 %] 98 % (12/12 1905) Weight:  [85.8 kg] 85.8 kg (12/12 1831) General: Awake alert in no distress HEENT: Normocephalic, neck in c-collar Lungs: Clear CVS: Regular rate rhythm Abdomen  nondistended nontender Extremities: Tender right lower extremity to touch Neurological exam Is awake alert oriented to self.  Got his age wrong.  Got the month right. Knew that he was in the hospital. Speech was moderate to severely dysarthric and sometimes very hard to discern. Cranial nerves II to XII intact Motor exam with no weakness in the upper extremities and no drift.  Both lower extremities were not antigravity Sensation was diminished on the left. Coordination difficult to assess given his mentation NIH stroke scale 1a Level of Conscious.: 0 1b LOC Questions: 1 1c LOC Commands: 0 2 Best Gaze: 0 3 Visual: 0 4 Facial Palsy: 0 5a Motor Arm - left: 0 5b Motor Arm - Right: 0 6a Motor Leg - Left: 3 6b Motor Leg - Right: 3 7 Limb Ataxia: 0 8 Sensory: 1 9 Best Language: 0 10  Dysarthria: 2 11 Extinct. and Inatten.: 0 TOTAL: 10  Labs I have reviewed labs in epic and the results pertinent to this consultation are:  CBC    Component Value Date/Time   WBC 12.4 (H) 08/01/2021 1825   RBC 4.58 08/25/2021 1825   HGB 14.3 08/14/2021 1832   HCT 42.0 08/28/2021 1832   PLT 156 08/14/2021 1825   MCV 89.7 08/02/2021 1825   MCH 29.5 08/24/2021 1825   MCHC 32.8 08/07/2021 1825   RDW 13.6 08/21/2021 1825   LYMPHSABS 3.1 08/07/2021 1825   MONOABS 0.9 08/05/2021 1825   EOSABS 0.0 08/22/2021 1825   BASOSABS 0.1 08/09/2021 1825    CMP     Component Value Date/Time   NA 140 08/22/2021 1832   K 3.7 08/01/2021 1832   CL 103 08/18/2021 1832   CO2 27 08/26/2021 1035   GLUCOSE 497 (H) 08/06/2021 1832   BUN 26 (H) 08/01/2021 1832   CREATININE 2.50 (H) 08/05/2021 1832   CALCIUM 9.2 08/13/2021 1035   PROT 7.0 08/27/2021 1035   ALBUMIN 3.4 (L) 08/22/2021 1035   AST 24 08/23/2021 1035   ALT 20 08/13/2021 1035   ALKPHOS 73 08/03/2021 1035   BILITOT 0.7 08/29/2021 1035   GFRNONAA 29 (L) 08/17/2021 1035   GFRAA 48 (L) 03/12/2020 1453   Imaging I have reviewed the images  obtained:  CT-head-aspects 10.  No acute changes CT angio head and neck: Minimal carotid bifurcation atherosclerosis but no stenosis.  Minimal siphon atherosclerotic calcification without stenosis greater than 30%.  Estimated 30% stenosis of the proximal basilar.  Atherosclerotic irregularity of the more distal intracranial branch vessels including within both MCA territories.  Moderate stenosis of the left PCA P2 segment.   Assessment: 80 year old diabetes, hypertension, hyperlipidemia, prostate cancer, stroke in 2020 with left hemiparesis with mild left-sided deficits at baseline presented emergency room for evaluation of sudden onset of loss of consciousness.  Had a similar episode in the morning with some ensuing lethargy before returning back to baseline.  [facility], after which she was dysarthric. Also noted to be hyperglycemic and has AKI. In the CT scanner, had leftward gaze deviation with left arm stiffening concerning for a seizure with origin in the right cerebral hemisphere. Likely new onset seizure in the setting of toxic metabolic derangements, prior stroke and hyperglycemia. LKW is outside the window for IV tPA No emergent LVO on CTA head and neck for him to be a candidate for EVT.   Impression: Stroke like symptoms Seizure - new onset Hyperglycemia Toxic metabolic encephalopathy Hypotension  Recommendations: -Loaded with Keppra 1 g in the ER -Ativan 1 mg IV x1 -Use Ativan IV for seizure lasting more than 5 minutes.  Please also call the on-call neurologist at that time. -Stat EEG -MRI at some point -Seizure precautions -If has another seizure, will need second antiepileptic agent load.  Please call the on-call neurologist if he has another seizure. -Continue Keppra 500 twice daily -Management of hyperglycemia per ER/primary team -Management of deranged renal function per primary team -Management of hypotension per primary team.  Neurology will  follow with you. Stat EEG will be done shortly. Decision on LTM based on EEG. Care signed out to night neurologist on call.  Plan d/w Dr. Tomi Bamberger in the ER -- Amie Portland, MD Neurologist [facility] Pager: [phone]   [address] Performed by: Amie Portland, MD Total critical care time: 50 minutes Critical care time was exclusive of separately  billable procedures and treating other patients and/or supervising APPs/Residents/Students Critical care was necessary to treat or prevent imminent or life-threatening deterioration due to stroke like symptoms, new onset seizure.  This patient is critically ill and at significant risk for neurological worsening and/or death and care requires constant monitoring. Critical care was time spent personally by me on the following activities: development of treatment plan with patient and/or surrogate as well as nursing, discussions with consultants, evaluation of patient's response to treatment, examination of patient, obtaining history from patient or surrogate, ordering and performing treatments and interventions, ordering and review of laboratory studies, ordering and review of radiographic studies, pulse oximetry, re-evaluation of patient's condition, participation in multidisciplinary rounds and medical decision making of high complexity in the care of this patient.

## 2021-08-11 NOTE — ED Provider Notes (Addendum)
[place] [facility] EMERGENCY DEPARTMENT Provider Note   CSN: [id number] Arrival date & time: 08/29/2021  1015     History Chief Complaint  Patient presents with   Loss of Consciousness    Eric Wade is a 80 y.o. male.  HPI  80 year old male with past medical history of HTN, HLD, DM, previous CVA presents to the emergency department with ams episode.  Patient states for the past week he has had a general unwell feeling.  No specific complaints.  This morning while he was sitting and eating breakfast that is typical time he started to feel unwell.  Daughter reports that she found him sitting at the table with food in his mouth, eyes open.  He looked like he was about to pass out so she laid him down to the ground.  This is when he went back to normal.  There was no seizure-like activity.  Patient recalls this episode and said he felt out of it briefly but does not recall any specific aphasia, focal neuro complaints, facial droop, headache, chest pain, palpitations, shortness of breath. Patient is back to baseline.  No seizure-like activity, no history of seizures.  Vitals and CBG were stable at the scene.  Patient at this time feels fatigued but has no specific complaints.  No focal neuro complaint or weakness.  Past Medical History:  Diagnosis Date   Allergic rhinitis 08/25/2016   DIABETES MELLITUS, TYPE II 05/08/2007   ERECTILE DYSFUNCTION 05/08/2007   GOUT 07/01/2007   HYPERLIPIDEMIA 05/08/2007   HYPERTENSION 05/08/2007   OBESITY 05/08/2007   Preventative health care 12/28/2010   Prostate cancer ([place]) 07/02/2008   Qualifier: Diagnosis of  By: Jenny Reichmann MD, Hunt Oris    PSA, INCREASED 07/02/2008   RENAL INSUFFICIENCY 07/01/2007   Seasonal allergies 12/31/2018    Patient Active Problem List   Diagnosis Date Noted   Nonspecific abnormal electrocardiogram (ECG) (EKG)    Hyperglycemia 03/11/2020   Bradycardia 03/11/2020   Diabetic ulcer of ankle ([place]) 02/21/2019   AMS (altered  mental status) 02/21/2019   Neurology follow-up encounter 02/20/2019   Episodic altered awareness 02/20/2019   Acute kidney injury superimposed on chronic kidney disease ([place]) 02/20/2019   Normocytic anemia 02/20/2019   Increased ammonia level 01/10/2019   Hypokalemia    Hypophosphatemia    Acute metabolic encephalopathy    Encephalopathy acute    Stroke ([place]) 12/28/2018   Allergic rhinitis 08/25/2016   Preventative health care 12/28/2010   Prostate cancer ([place]) 07/02/2008   GOUT 07/01/2007   CKD (chronic kidney disease) stage 3, GFR 30-59 ml/min ([employer]) 07/01/2007   Insulin dependent diabetes mellitus 05/08/2007   Hyperlipidemia 05/08/2007   OBESITY 05/08/2007   ERECTILE DYSFUNCTION 05/08/2007   Essential hypertension 05/08/2007    History reviewed. No pertinent surgical history.     Family History  Problem Relation Age of Onset   Hypertension Other     Social History   Tobacco Use   Smoking status: Former   Smokeless tobacco: Never  Scientific laboratory technician Use: Never used  Substance Use Topics   Alcohol use: No   Drug use: No    Home Medications Prior to Admission medications   Medication Sig Start Date End Date Taking? Authorizing Provider  albuterol (VENTOLIN HFA) 108 (90 Base) MCG/ACT inhaler Inhale 2 puffs into the lungs every 4 (four) hours as needed for wheezing or shortness of breath. 12/01/19  Yes [provider]  amLODipine (NORVASC) 5 MG tablet Take 1  tablet (5 mg total) by mouth daily. 03/12/20 08/01/2021 Yes Gherghe, Vella Redhead, MD  atorvastatin (LIPITOR) 20 MG tablet TAKE 1 TABLET (20 MG TOTAL) BY MOUTH DAILY AT 6 PM. Patient taking differently: Take 20 mg by mouth at bedtime. 05/13/20  Yes [name], MD  furosemide (LASIX) 40 MG tablet Take 40 mg by mouth in the morning and at bedtime.   Yes [provider]  hydrALAZINE (APRESOLINE) 50 MG tablet TAKE 1 & 1/2 TABLET EVERY 6 HOURS Patient taking differently: Take 75 mg by mouth every 6 (six)  hours. 06/14/20  Yes [name], MD  insulin aspart (NOVOLOG) 100 UNIT/ML FlexPen 0-30 Units See admin instructions. Max units in a day-30-sliding scale 10/28/20  Yes [provider]  insulin glargine (LANTUS SOLOSTAR) 100 UNIT/ML Solostar Pen Inject 20 Units into the skin every evening. 20 units at 1700 10/28/20  Yes [provider]  potassium chloride SA (KLOR-CON) 20 MEQ tablet Take 1 tablet (20 mEq total) by mouth 2 (two) times daily. 07/17/21  Yes Charlesetta Shanks, MD  tamsulosin (FLOMAX) 0.4 MG CAPS capsule Take 1 capsule (0.4 mg total) by mouth at bedtime. 03/12/20  Yes Gherghe, Vella Redhead, MD  TRULICITY 1.5 UT/6.5YY SOPN Inject 0.5 mLs into the skin once a week. Tuesdays 07/16/21  Yes [provider]  Accu-Chek FastClix Lancets MISC USE TO CHECK BLOOD SUGAR UP TO 4 TIMES A DAY (E11.9) 10/07/19   [name], MD  ACCU-CHEK GUIDE test strip USE TO CHECK BLOOD SUGAR UP TO 4 TIMES A DAY. 03/26/20   [name], MD  blood glucose meter kit and supplies KIT Dispense based on patient and insurance preference. Use up to four times daily as directed. (FOR ICD-9 250.00, 250.01). 01/03/19   Florencia Reasons, MD    Allergies    Nsaids  Review of Systems   Review of Systems  Constitutional:  Positive for fatigue. Negative for chills and fever.  HENT:  Negative for congestion.   Eyes:  Negative for visual disturbance.  Respiratory:  Negative for shortness of breath.   Cardiovascular:  Negative for chest pain.  Gastrointestinal:  Negative for abdominal pain, diarrhea and vomiting.  Genitourinary:  Negative for dysuria.  Skin:  Negative for rash.  Neurological:  Positive for syncope. Negative for dizziness, seizures, facial asymmetry, speech difficulty, weakness, light-headedness, numbness and headaches.   Physical Exam Updated Vital Signs BP (!) 150/69   Pulse 65   Temp 97.6 F (36.4 C) (Oral)   Resp 20   SpO2 97%   Physical Exam Vitals and nursing note reviewed.   Constitutional:      General: He is not in acute distress.    Appearance: Normal appearance. He is not diaphoretic.  HENT:     Head: Normocephalic.     Mouth/Throat:     Mouth: Mucous membranes are moist.  Cardiovascular:     Rate and Rhythm: Normal rate.  Pulmonary:     Effort: Pulmonary effort is normal. No respiratory distress.  Abdominal:     Palpations: Abdomen is soft.     Tenderness: There is no abdominal tenderness.  Musculoskeletal:        General: No swelling.     Cervical back: No rigidity or tenderness.  Skin:    General: Skin is warm.  Neurological:     General: No focal deficit present.     Mental Status: He is alert and oriented to person, place, and time. Mental status is at  baseline.  Psychiatric:        Mood and Affect: Mood normal.    ED Results / Procedures / Treatments   Labs (all labs ordered are listed, but only abnormal results are displayed) Labs Reviewed  CBC WITH DIFFERENTIAL/PLATELET - Abnormal; Notable for the following components:      Result Value   WBC 12.9 (*)    Neutro Abs 9.4 (*)    All other components within normal limits  CBG MONITORING, ED - Abnormal; Notable for the following components:   Glucose-Capillary 309 (*)    All other components within normal limits  COMPREHENSIVE METABOLIC PANEL  MAGNESIUM    EKG EKG Interpretation  Date/Time:  Monday August 11 2021 10:20:07 EST Ventricular Rate:  70 PR Interval:  204 QRS Duration: 103 QT Interval:  522 QTC Calculation: 564 R Axis:   24 Text Interpretation: Sinus rhythm Prolonged QT interval NSR Confirmed by Lavenia Atlas ([id number]) on 08/21/2021 11:40:26 AM  Radiology No results found.  Procedures Procedures   Medications Ordered in ED Medications - No data to display  ED Course  I have reviewed the triage vital signs and the nursing notes.  Pertinent labs & imaging results that were available during my care of the patient were reviewed by me and considered in my  medical decision making (see chart for details).    MDM Rules/Calculators/A&P                           80 year old male presents the emergency department after an altered mental status episode.  Daughter found him sitting at the table with food in his mouth, eyes open.  Did not seem to be struggling with words or aphasic but was altered.  Was moving all 4 extremities and lowered to the ground because the daughter felt like he was going to "go out".  No full loss of consciousness.  Fingerstick was appropriate.  Vitals are stable on arrival, he is neuro intact, feels back to baseline.  Does not sound like full syncope.  No associated neuro symptoms.  No seizure-like activity.  Original EKG had a prolonged QTC that I disagreed with, repeat shows appropriate QTC without any intervention.  Blood work shows hypokalemia that has been chronic since his last visit 3 weeks ago.  He was placed on a couple days of oral potassium but ran out of his prescription and has had not had any for the past week.  He is on daily Lasix.  Head CT shows no acute changes. Otherwise baseline CKD.  Patient was given oral potassium here.  He has had no arrhythmias or abnormal findings on monitor/blood work.  Magnesium is normal.  Do not feel that this episode is ACS or arrhythmia related.  No full syncope or other acute neuro complaints in regards to TIA/CVA, sounds atypical.  Patient has remained baseline.  Daughter is bedside and states that he is his normal.  In regards to this being near syncope he is low risk in regards to [place] syncope rules.  No history of CHF.  Plan for close outpatient follow-up and repeat potassium and blood work next week with strict return to ED precautions.  Patient at this time appears safe and stable for discharge and will be treated as an outpatient.  Discharge plan and strict return to ED precautions discussed, patient verbalizes understanding and agreement.  Final Clinical Impression(s) /  ED Diagnoses Final diagnoses:  None  Rx / DC Orders ED Discharge Orders     None        Lorelle Gibbs, DO 08/18/2021 1602    Lorelle Gibbs, DO 08/16/2021 [phone]

## 2021-08-11 NOTE — Procedures (Addendum)
Patient Name: Eric Wade  MRN: [id number]  Epilepsy Attending: Lora Havens  Referring Physician/Provider: Dr Amie Portland Date: 08/02/2021 Duration: 22.03 mins  Patient history: 80 -year-old diabetes, stroke in 2020 with left hemiparesis with mild left-sided deficits at baseline presented emergency room for evaluation of sudden onset of loss of consciousness.  Had a similar episode in the morning with some ensuing lethargy before returning back to baseline.  [facility] after which she was dysarthric. Also noted to be hyperglycemic and has AKI. In the CT scanner, had leftward gaze deviation with left arm stiffening concerning for a seizure with origin in the right cerebral hemisphere. EEG to assess for status epilepticus.   Level of alertness: Awake  AEDs during EEG study: ativan LEV  Technical aspects: This EEG study was done with scalp electrodes positioned according to the 10-20 International system of electrode placement. Electrical activity was acquired at a sampling rate of 500Hz  and reviewed with a high frequency filter of 70Hz  and a low frequency filter of 1Hz . EEG data were recorded continuously and digitally stored.   Description: The posterior dominant rhythm consists of 8Hz  activity of moderate voltage (25-35 uV) seen predominantly in posterior head regions, symmetric and reactive to eye opening and eye closing. Hyperventilation and photic stimulation were not performed.     EEG was technically difficult due to significant sweat artifact  IMPRESSION: This technically difficult study is within normal limits. No seizures or epileptiform discharges were seen throughout the recording.  Neurologist on call was notified.   Silvana Holecek Barbra Sarks

## 2021-08-11 NOTE — ED Provider Notes (Addendum)
Patient presented as a possible code stroke after recently being in the ED earlier this afternoon.  On arrival in ED patient was initially normotensive but came hypotensive in the scanner as he was getting assessors code stroke.  Code stroke deactivated.  Patient unable to speak or answer my questions Physical Exam  BP (!) 42/34   Pulse (!) 0   Resp (!) 24   Wt 85.8 kg   SpO2 (!) 88%   BMI 24.29 kg/m   Physical Exam General: appears to be in distress Heart tachycardic Lungs tachypneic Abdomen nondistended Extremities warm no rashes noted ED Course/Procedures   Clinical Course as of 08/10/2021 2247  Mon Aug 11, 2021  1850 Glucose(!): 497 [JS]  1937 Creatinine(!): 2.50 Worsen than his baseline [JS]  2051 pH, Arterial(!): 7.221 [JS]    Clinical Course User Index [JS] Janeece Fitting, PA-C    .Critical Care Performed by: Dorie Rank, MD Authorized by: Dorie Rank, MD   Critical care provider statement:    Critical care time (minutes):  75   Critical care was time spent personally by me on the following activities:  Development of treatment plan with patient or surrogate, discussions with consultants, evaluation of patient's response to treatment, examination of patient, ordering and review of laboratory studies, ordering and review of radiographic studies, ordering and performing treatments and interventions, pulse oximetry, re-evaluation of patient's condition and review of old charts Procedure Name: Intubation Date/Time: 08/19/2021 10:49 PM Performed by: Dorie Rank, MD Pre-anesthesia Checklist: Patient identified, Patient being monitored, Emergency Drugs available, Timeout performed and Suction available Oxygen Delivery Method: Non-rebreather mask Preoxygenation: Pre-oxygenation with 100% oxygen Ventilation: Mask ventilation without difficulty Laryngoscope Size: Glidescope Tube size: 7.5 mm Number of attempts: 1 Placement Confirmation: ETT inserted through vocal cords under  direct vision, CO2 detector and Breath sounds checked- equal and bilateral Dental Injury: Teeth and Oropharynx as per pre-operative assessment     .Central Line  Date/Time: 08/14/2021 10:52 PM Performed by: Dorie Rank, MD Authorized by: Dorie Rank, MD   Consent:    Consent obtained:  Emergent situation Pre-procedure details:    Indication(s): central venous access     Hand hygiene: Hand hygiene performed prior to insertion     Skin preparation:  Chlorhexidine   Skin preparation agent: Skin preparation agent completely dried prior to procedure   Sedation:    Sedation type:  None Anesthesia:    Anesthesia method:  None Procedure details:    Location:  R femoral   Procedural supplies:  Triple lumen   Landmarks identified: yes     Ultrasound guidance: yes     Number of attempts:  1 Post-procedure details:    Post-procedure:  Dressing applied   Assessment:  Blood return through all ports and free fluid flow   Procedure completion:  Tolerated well, no immediate complications  MDM  Pt Was persistently hypotensive on presentation in the ED.  Sepsis protocol initiated with antibiotics and fluids.  Patient also notably hyperglycemic.  No anion gap so doubt DKA.  Presentation not typical for hyperosmolar syndrome.  Continued on IV fluids and pressors after no response to fluids.  Patient had several episodes of cardiac arrest during his evaluation.  Patient will become bradycardic we would initiate CPR and administer epinephrine and get return of spontaneous circulation.   Patient had 2 episodes of cardiac arrest and we had a period of stability.  Critical care consulted.  Central lines and art lines were placed.  Patient had another episode where  he became bradycardic and asystolic.  CPR initiated and did get return of spontaneous circulation.  Patient will be transported to the ICU       Dorie Rank, MD 08/23/2021 2257 Corrected dictation error   Dorie Rank, MD August 29, 2021 [phone]

## 2021-08-11 NOTE — ED Notes (Signed)
Notified provider of elevated CBG and critical lactic 6.0

## 2021-08-12 DIAGNOSIS — R6521 Severe sepsis with septic shock: Secondary | ICD-10-CM

## 2021-08-12 DIAGNOSIS — A419 Sepsis, unspecified organism: Principal | ICD-10-CM

## 2021-08-12 LAB — COMPREHENSIVE METABOLIC PANEL
ALT: 312 U/L — ABNORMAL HIGH (ref 0–44)
AST: 398 U/L — ABNORMAL HIGH (ref 15–41)
Albumin: 1.8 g/dL — ABNORMAL LOW (ref 3.5–5.0)
Alkaline Phosphatase: 69 U/L (ref 38–126)
Anion gap: 21 — ABNORMAL HIGH (ref 5–15)
BUN: 25 mg/dL — ABNORMAL HIGH (ref 8–23)
CO2: 17 mmol/L — ABNORMAL LOW (ref 22–32)
Calcium: 8.4 mg/dL — ABNORMAL LOW (ref 8.9–10.3)
Chloride: 105 mmol/L (ref 98–111)
Creatinine, Ser: 2.92 mg/dL — ABNORMAL HIGH (ref 0.61–1.24)
GFR, Estimated: 21 mL/min — ABNORMAL LOW (ref >60)
Glucose, Bld: 830 mg/dL (ref 70–99)
Potassium: 5.2 mmol/L — ABNORMAL HIGH (ref 3.5–5.1)
Sodium: 143 mmol/L (ref 135–145)
Total Bilirubin: 0.7 mg/dL (ref 0.3–1.2)
Total Protein: 3.8 g/dL — ABNORMAL LOW (ref 6.5–8.1)

## 2021-08-12 LAB — MAGNESIUM: Magnesium: 2.6 mg/dL — ABNORMAL HIGH (ref 1.7–2.4)

## 2021-08-12 LAB — CBC WITH DIFFERENTIAL/PLATELET
Abs Immature Granulocytes: 1.22 10*3/uL — ABNORMAL HIGH (ref 0.00–0.07)
Basophils Absolute: 0.1 10*3/uL (ref 0.0–0.1)
Basophils Relative: 0 %
Eosinophils Absolute: 0.1 10*3/uL (ref 0.0–0.5)
Eosinophils Relative: 0 %
HCT: 33.6 % — ABNORMAL LOW (ref 39.0–52.0)
Hemoglobin: 10.5 g/dL — ABNORMAL LOW (ref 13.0–17.0)
Immature Granulocytes: 5 %
Lymphocytes Relative: 21 %
Lymphs Abs: 5.4 10*3/uL — ABNORMAL HIGH (ref 0.7–4.0)
MCH: 29.8 pg (ref 26.0–34.0)
MCHC: 31.3 g/dL (ref 30.0–36.0)
MCV: 95.5 fL (ref 80.0–100.0)
Monocytes Absolute: 1.1 10*3/uL — ABNORMAL HIGH (ref 0.1–1.0)
Monocytes Relative: 4 %
Neutro Abs: 17.5 10*3/uL — ABNORMAL HIGH (ref 1.7–7.7)
Neutrophils Relative %: 70 %
Platelets: UNDETERMINED 10*3/uL (ref 150–400)
RBC: 3.52 MIL/uL — ABNORMAL LOW (ref 4.22–5.81)
RDW: 13.8 % (ref 11.5–15.5)
Smear Review: UNDETERMINED
WBC: 25.4 10*3/uL — ABNORMAL HIGH (ref 4.0–10.5)
nRBC: 0 % (ref 0.0–0.2)

## 2021-08-12 LAB — PROTIME-INR
INR: 1.6 — ABNORMAL HIGH (ref 0.8–1.2)
Prothrombin Time: 18.9 seconds — ABNORMAL HIGH (ref 11.4–15.2)

## 2021-08-12 LAB — URINALYSIS, ROUTINE W REFLEX MICROSCOPIC
Bilirubin Urine: NEGATIVE
Glucose, UA: >=500 mg/dL — AB
Ketones, ur: NEGATIVE mg/dL
Nitrite: NEGATIVE
Protein, ur: 100 mg/dL — AB
Specific Gravity, Urine: 1.02 (ref 1.005–1.030)
pH: 5.5 (ref 5.0–8.0)

## 2021-08-12 LAB — GLUCOSE, CAPILLARY
Glucose-Capillary: >600 mg/dL (ref 70–99)
Glucose-Capillary: >600 mg/dL (ref 70–99)

## 2021-08-12 LAB — URINALYSIS, MICROSCOPIC (REFLEX)
RBC / HPF: >50 RBC/hpf (ref 0–5)
WBC, UA: >50 WBC/hpf (ref 0–5)

## 2021-08-12 LAB — TROPONIN I (HIGH SENSITIVITY): Troponin I (High Sensitivity): 468 ng/L (ref <18)

## 2021-08-12 LAB — BRAIN NATRIURETIC PEPTIDE: B Natriuretic Peptide: 112.8 pg/mL — ABNORMAL HIGH (ref 0.0–100.0)

## 2021-08-12 LAB — LACTIC ACID, PLASMA: Lactic Acid, Venous: >9 mmol/L (ref 0.5–1.9)

## 2021-08-12 LAB — PHOSPHORUS: Phosphorus: 8.9 mg/dL — ABNORMAL HIGH (ref 2.5–4.6)

## 2021-08-12 LAB — BETA-HYDROXYBUTYRIC ACID: Beta-Hydroxybutyric Acid: 0.44 mmol/L — ABNORMAL HIGH (ref 0.05–0.27)

## 2021-08-12 MED ORDER — INSULIN REGULAR BOLUS VIA INFUSION
10.0000 [IU] | Freq: Once | INTRAVENOUS | Status: AC
  Administered 2021-08-12: 10 [IU] via INTRAVENOUS
  Filled 2021-08-12: qty 10

## 2021-08-12 MED ORDER — HYDROCORTISONE SOD SUC (PF) 100 MG IJ SOLR: 100.0000 mg | Freq: Three times a day (TID) | INTRAMUSCULAR | Status: DC

## 2021-08-12 MED ORDER — FENTANYL CITRATE (PF) 100 MCG/2ML IJ SOLN: 50.0000 ug | Freq: Once | INTRAMUSCULAR | Status: DC

## 2021-08-12 MED ORDER — AMIODARONE HCL IN DEXTROSE 360-4.14 MG/200ML-% IV SOLN
INTRAVENOUS | Status: AC
  Filled 2021-08-12: qty 200

## 2021-08-31 NOTE — Progress Notes (Signed)
Chaplain responded to follow-up call for support of family at the time of the death of their dad.   Chaplain offered ministry of presence at bedside.  Chaplain escorted pt's daughter from hospital upon her departure.  [place]

## 2021-08-31 NOTE — Procedures (Addendum)
Arterial Catheter Insertion Procedure Note  Eric Wade  [id number]  19-Sep-1940  Date:08/16/21  Time:11:55 AM    Provider Performing: Collier Bullock    Procedure: Insertion of Arterial Line [phone]) without US guidance  Indication(s) Blood pressure monitoring and/or need for frequent ABGs  Consent Unable to obtain consent due to emergent nature of procedure.  Anesthesia None   Time Out Verified patient identification, verified procedure, site/side was marked, verified correct patient position, special equipment/implants available, medications/allergies/relevant history reviewed, required imaging and test results available.   Sterile Technique  Maximal sterile technique including full sterile barrier drape, hand hygiene, sterile gown, sterile gloves, mask, hair covering, sterile ultrasound probe cover (if used).   Procedure Description Area of catheter insertion was cleaned with chlorhexidine and draped in sterile fashion. Without real-time ultrasound guidance an arterial catheter was placed into the left radial artery.  Appropriate arterial tracings confirmed on monitor.     Complications/Tolerance None; patient tolerated the procedure well.   EBL Minimal   Specimen(s) None

## 2021-08-31 NOTE — Progress Notes (Signed)
12/09/03- Patient arrived on the unit unresponsive, intubated on the ventilator, epinephrine @ 20 mcg/min, levophed @ 60 mcg/min. Heart rate junctional in the 60s, arterial blood pressure 70 systolic.  1610- Dr. Duwayne Heck, Pharmacist and respiratory at bedside systolic blood pressure in the 40s. 1 amp of bicarb given, 1 amp epi given and Vasopressin started @ 0.04 units/min per pharmacy. 1 liter bolus of lactated ringers started. No change in blood pressure 23:26 heart rate 33 blood pressure 45/26. Instructed by Dr. Duwayne Heck to increase the epi gtt and levophed gtt to achieve systolic greater than 90. Epi titrated to 30 mcg/min and levophed to 80 mcg/min. (Refer to vitals flow sheet for vitals) Blood glucose taken from arterial line read greater than 600. Sodium bicarb gtt started, insulin gtt started based on endo tool, 50 mEq sodium bicarb push given and 2 epi pushes given.   Patient DNR. Daughter [name] at bedside.   0100- Patient with frequent PVCs. Dr. Duwayne Heck at bedside  0105- Patient in v-fib. Dr. Duwayne Heck called to bedside.   0107- Patient shocked with 200j per Dr. Duwayne Heck. Asystole after shock to idioventricular agonal rhythm with faint pulse.   0116- Patient PEA. TOD pronounced by Dr. Duwayne Heck. No heart sounds or lung sounds to ascultation heard by myself or Ginger, RN.  Chaplain at bedside. Dr. Duwayne Heck spoke with daughters [name] and [place]. All belongings taken with daughters. Dentures left with the patient.   Medical examiner Izora Ribas called to verify patient was not a ME case. He stated patient does not meet criteria.

## 2021-08-31 NOTE — Death Summary Note (Addendum)
DEATH SUMMARY   Patient Details  Name: Eric Wade MRN: [id number] DOB: 07-May-1941  Admission/Discharge Information   Admit Date:  08-19-2021  Date of Death: Date of Death: 08-20-2021  Time of Death: Time of Death: 0116  Length of Stay: 1  Referring Physician: Cipriano Mile, NP   Reason(s) for Hospitalization  Syncope   Diagnoses  Preliminary cause of death: shock  Secondary Diagnoses (including complications and co-morbidities):  Principal Problem:   Septic shock [facility])   [facility] (including significant findings, care, treatment, and services provided and events leading to death)  Eric Wade is a 81 y.o. year old male who presented with syncope in the AM of 2021/08/19, and was discharged from the ED.  Work up only positive for very mild worsening renal funciton.  Returned within hours with another episode of syncope, and dysarthria and facial droop, possible seizure witnessed in ED>  Developed severe hypotension refractory to pressors.  Cardiac arrest x 2 in ed, intubated.   Was stable on levophed 5mcg/min and epi 108mcg min at my assessment in ED.   Upon arrival to ICU, again was profoundly hypotensive and on high doses of pressors.   Required push of epinephrine 2mg  over several minutes.  Was more stable on high dose pressors for roughly one hour, then developed Vfib, possible torsades, pulseless.  I made the decision to defibrillate (my conversation with the daughters had been specifically about no chest compressions).  He developed a long pause, followed by bradycardia with hypotension.  HR increased to 40s, but his bp remained very low.   After several minutes developed PEA.  NO carotid, femoral or radial pulses, no heart beat auscultated.   Daughter [name] informed of death.    Pertinent Labs and Studies  Significant Diagnostic Studies CT Head Wo Contrast  Result Date: August 19, 2021 CLINICAL DATA:  Head trauma, minor. Additional history provided:  Syncopal episode. EXAM: CT HEAD WITHOUT CONTRAST TECHNIQUE: Contiguous axial images were obtained from the base of the skull through the vertex without intravenous contrast. COMPARISON:  Head CT 02/20/2019. FINDINGS: Brain: Mild-to-moderate generalized cerebral atrophy. Known chronic lacunar infarcts within the bilateral deep gray nuclei and within the pons, some of which were better appreciated on the brain MRI of 12/29/2018. Moderate patchy and ill-defined hypoattenuation within the cerebral white matter, nonspecific but compatible with chronic small vessel ischemic disease. There is no acute intracranial hemorrhage. No demarcated cortical infarct. No extra-axial fluid collection. No evidence of an intracranial mass. No midline shift. Vascular: No hyperdense vessel.  Atherosclerotic calcifications. Skull: Normal. Negative for fracture or focal lesion. Sinuses/Orbits: Visualized orbits show no acute finding. Trace mucosal thickening within the bilateral ethmoid and right maxillary sinuses. IMPRESSION: No evidence of acute intracranial abnormality. Moderate chronic small vessel ischemic changes within the cerebral white matter. Known chronic lacunar infarcts within the bilateral deep gray nuclei and within the pons, some of which were better appreciated on the brain MRI of 12/29/2018. Mild-to-moderate generalized cerebral atrophy. Electronically Signed   By: Kellie Simmering D.O.   On: 2021/08/19 13:18   DG Pelvis Portable  Result Date: 2021-08-19 CLINICAL DATA:  Fall EXAM: PORTABLE PELVIS 1-2 VIEWS COMPARISON:  None. FINDINGS: Possible irregularity involving the left femoral neck, raising concern for a transcervical left hip fracture. Right hip is intact.  Bowel hip joint space are preserved. Visualized bony pelvis appears intact. Degenerative changes of the lower lumbar spine. IMPRESSION: Suspected transcervical left hip fracture. Dedicated left hip radiographs are suggested. Electronically Signed  By: Julian Hy M.D.   On: 08/01/2021 19:46   CT C-SPINE NO CHARGE  Result Date: 08/13/2021 CLINICAL DATA:  Code stroke. EXAM: CT CERVICAL SPINE WITHOUT CONTRAST TECHNIQUE: Multidetector CT imaging of the cervical spine was performed without intravenous contrast. Multiplanar CT image reconstructions were also generated. COMPARISON:  None. FINDINGS: Alignment: No malalignment. Skull base and vertebrae: No fracture or focal bone lesion. Soft tissues and spinal canal: No regional soft tissue abnormality seen. Disc levels: Foramen magnum is widely patent. Ordinary arthritic change at the C1-2 articulation but without encroachment upon the neural structures. C2-3: Normal interspace. C3-4: Bulging of the disc. Congenitally small canal. Mild multifactorial stenosis. C4-5: Bulging of the disc. Congenitally small canal. Moderate multifactorial stenosis. Bony foraminal stenosis on the right. C5-6: Endplate osteophytes and bulging of the disc. Congenitally small canal. Moderate to severe multifactorial stenosis. Bilateral foraminal stenosis right worse than left. C6-7: Endplate osteophytes and bulging of the disc. Mild bilateral bony foraminal stenosis. C7-T1: Mild bulging of the disc.  No stenosis. Upper chest: Emphysema and scarring. Other: None IMPRESSION: Congenitally small spinal canal. Superimposed spondylosis. Canal stenosis most pronounced at C4-5 and C5-6 and to a lesser extent at C3-4 and C6-7. Bony foraminal stenoses as outlined above with potential for additional neural compression. Electronically Signed   By: Nelson Chimes M.D.   On: 08/22/2021 19:22   DG Chest Portable 1 View  Result Date: 08/29/2021 CLINICAL DATA:  ET tube placement EXAM: PORTABLE CHEST 1 VIEW COMPARISON:  08/24/2021 FINDINGS: NG tube tip is in the distal esophagus. Endotracheal tube tip is 3.7 cm above the carina. Heart is normal size. Right base opacity, likely atelectasis. Left lung clear. No effusions or pneumothorax. IMPRESSION:  Endotracheal tube in expected position. NG tube tip in the distal esophagus. Right base atelectasis. Electronically Signed   By: Rolm Baptise M.D.   On: 08/13/2021 21:59   DG Chest Port 1 View  Result Date: 08/27/2021 CLINICAL DATA:  Syncope, fall EXAM: PORTABLE CHEST 1 VIEW COMPARISON:  07/17/2021 FINDINGS: Lungs are clear.  No pleural effusion or pneumothorax. The heart is normal in size.  Thoracic aortic atherosclerosis. IMPRESSION: No evidence of acute cardiopulmonary disease. Electronically Signed   By: Julian Hy M.D.   On: 08/30/2021 19:44   DG Knee Left Port  Result Date: 08/10/2021 CLINICAL DATA:  Fall EXAM: PORTABLE LEFT KNEE - 1-2 VIEW COMPARISON:  None. FINDINGS: No fracture or dislocation is seen. The joint spaces are preserved. Visualized soft tissues are within normal limits. IMPRESSION: Negative. Electronically Signed   By: Julian Hy M.D.   On: 08/18/2021 19:44   EEG adult  Result Date: 08/13/2021 Lora Havens, MD     08/01/2021  8:33 PM Patient Name: TILLMAN KAZMIERSKI MRN: [id number] Epilepsy Attending: Lora Havens Referring Physician/Provider: Dr Amie [place] Date: 08/01/2021 Duration: 22.03 mins Patient history: 39 -year-old diabetes, stroke in 2020 with left hemiparesis with mild left-sided deficits at baseline presented emergency room for evaluation of sudden onset of loss of consciousness.  Had a similar episode in the morning with some ensuing lethargy before returning back to baseline.  Had a similar episode at the [facility], after which she was dysarthric. Also noted to be hyperglycemic and has AKI. In the CT scanner, had leftward gaze deviation with left arm stiffening concerning for a seizure with origin in the right cerebral hemisphere. EEG to assess for status epilepticus. Level of alertness: Awake AEDs during EEG study: ativan LEV Technical aspects: This EEG  study was done with scalp electrodes positioned according to the 10-20 International system of  electrode placement. Electrical activity was acquired at a sampling rate of 500Hz  and reviewed with a high frequency filter of 70Hz  and a low frequency filter of 1Hz . EEG data were recorded continuously and digitally stored. Description: The posterior dominant rhythm consists of 8Hz  activity of moderate voltage (25-35 uV) seen predominantly in posterior head regions, symmetric and reactive to eye opening and eye closing. Hyperventilation and photic stimulation were not performed.   EEG was technically difficult due to significant sweat artifact IMPRESSION: This technically difficult study is within normal limits. No seizures or epileptiform discharges were seen throughout the recording. Neurologist on call was notified. Kerr   DG HIP UNILAT WITH PELVIS 2-3 VIEWS LEFT  Result Date: 08/20/2021 CLINICAL DATA:  [name] EXAM: DG HIP (WITH OR WITHOUT PELVIS) 2-3V LEFT COMPARISON:  08/25/2021 FINDINGS: Frontal and frogleg lateral views of the left hip are obtained. A displaced subcapital left femoral neck fracture is identified, with mild impaction and varus angulation at the fracture site. No dislocation. Soft tissues are unremarkable. IMPRESSION: 1. Subcapital left femoral neck fracture with mild impaction and varus angulation. Electronically Signed   By: Randa Ngo M.D.   On: 08/17/2021 20:12   CT HEAD CODE STROKE WO CONTRAST  Result Date: 08/14/2021 CLINICAL DATA:  Code stroke. New no deficit, acute, stroke suspected. EXAM: CT HEAD WITHOUT CONTRAST TECHNIQUE: Contiguous axial images were obtained from the base of the skull through the vertex without intravenous contrast. COMPARISON:  Earlier same day FINDINGS: Brain: Chronic small-vessel ischemic changes affect the pons. No focal cerebellar finding. Chronic small-vessel ischemic changes affect the thalami, basal ganglia and cerebral hemispheric white matter. No cortical or large vessel territory infarction. No mass lesion, hemorrhage,  hydrocephalus or extra-axial collection. Vascular: There is atherosclerotic calcification of the major vessels at the base of the brain. Skull: Negative Sinuses/Orbits: Clear/normal Other: None ASPECTS ([place] Stroke Program Early CT Score) - Ganglionic level infarction (caudate, lentiform nuclei, internal capsule, insula, M1-M3 cortex): 7 - Supraganglionic infarction (M4-M6 cortex): 3 Total score (0-10 with 10 being normal): 10 IMPRESSION: 1. No change since earlier today. Atrophy and chronic small-vessel ischemic changes throughout the brain as outlined above. 2. ASPECTS is 10 3. These results were communicated to Dr. Rory Percy at [facility] pm on 08/07/2021 by text page via the [facility] messaging system. Electronically Signed   By: Nelson Chimes M.D.   On: 08/05/2021 18:43   CT ANGIO HEAD NECK W WO CM W PERF (CODE STROKE)  Result Date: 08/03/2021 CLINICAL DATA:  Neuro deficit, acute, stroke suspected EXAM: CT ANGIOGRAPHY HEAD AND NECK TECHNIQUE: Multidetector CT imaging of the head and neck was performed using the standard protocol during bolus administration of intravenous contrast. Multiplanar CT image reconstructions and MIPs were obtained to evaluate the vascular anatomy. Carotid stenosis measurements (when applicable) are obtained utilizing NASCET criteria, using the distal internal carotid diameter as the denominator. CONTRAST:  40mL OMNIPAQUE IOHEXOL 350 MG/ML SOLN COMPARISON:  Head CT earlier same day FINDINGS: CTA NECK FINDINGS Aortic arch: Aortic atherosclerosis. Branching pattern is normal without origin stenosis. Right carotid system: Common carotid artery widely patent to the bifurcation. Mild calcified plaque at the carotid bifurcation and ICA bulb but no stenosis. Cervical ICA widely patent. Left carotid system: Common carotid artery widely patent to the bifurcation. Mild calcified plaque at the carotid bifurcation but no stenosis. Cervical ICA widely patent. Vertebral arteries: Both vertebral artery  origins  are widely patent. Both vertebral arteries appear normal through the cervical region to the foramen magnum. Skeleton: Cervical spondylosis with spinal stenosis as described at cervical spine CT. Other neck: No mass or lymphadenopathy. Upper chest: Emphysema and pulmonary scarring.  No acute process. Review of the MIP images confirms the above findings CTA HEAD FINDINGS Anterior circulation: Both internal carotid arteries are patent through the skull base and siphon regions. There is mild siphon atherosclerotic calcification but no stenosis greater than 30%. The anterior and middle cerebral vessels are patent without large vessel occlusion, proximal stenosis, aneurysm or vascular malformation. Some atherosclerotic irregularity of the more distal MCA branch vessels. Posterior circulation: Both vertebral arteries are widely patent through the foramen magnum to the basilar. Mild stenosis of the proximal basilar artery, estimated at 30%. Beyond that the basilar artery is widely patent. Superior cerebellar and posterior cerebral arteries show flow. There is moderate stenosis of the left P2 segment. Venous sinuses: Patent and normal. Anatomic variants: None significant. Review of the MIP images confirms the above findings IMPRESSION: Aortic atherosclerosis. Minimal carotid bifurcation atherosclerosis but no stenosis. Minimal siphon atherosclerotic calcification but without stenosis greater than 30%. Estimated 30% stenosis of the proximal basilar artery. Atherosclerotic irregularity of the more distal intracranial branch vessels, including within both MCA territories. Moderate stenosis of the left PCA P2 segment. Electronically Signed   By: Nelson Chimes M.D.   On: 08/09/2021 19:19    Microbiology Recent Results (from the past 240 hour(s))  Resp Panel by RT-PCR (Flu A&B, Covid) Nasopharyngeal Swab     Status: None   Collection Time: 08/10/2021 11:48 AM   Specimen: Nasopharyngeal Swab; Nasopharyngeal(NP) swabs in  vial transport medium  Result Value Ref Range Status   SARS Coronavirus 2 by RT PCR NEGATIVE NEGATIVE Final    Comment: (NOTE) SARS-CoV-2 target nucleic acids are NOT DETECTED.  The SARS-CoV-2 RNA is generally detectable in upper respiratory specimens during the acute phase of infection. The lowest concentration of SARS-CoV-2 viral copies this assay can detect is 138 copies/mL. A negative result does not preclude SARS-Cov-2 infection and should not be used as the sole basis for treatment or other patient management decisions. A negative result may occur with  improper specimen collection/handling, submission of specimen other than nasopharyngeal swab, presence of viral mutation(s) within the areas targeted by this assay, and inadequate number of viral copies(<138 copies/mL). A negative result must be combined with clinical observations, patient history, and epidemiological information. The expected result is Negative.  Fact Sheet for Patients:  EntrepreneurPulse.com.au  Fact Sheet for Healthcare Providers:  IncredibleEmployment.be  This test is no t yet approved or cleared by the [place] FDA and  has been authorized for detection and/or diagnosis of SARS-CoV-2 by FDA under an Emergency Use Authorization (EUA). This EUA will remain  in effect (meaning this test can be used) for the duration of the COVID-19 declaration under Section 564(b)(1) of the Act, 21 U.S.C.section 360bbb-3(b)(1), unless the authorization is terminated  or revoked sooner.       Influenza A by PCR NEGATIVE NEGATIVE Final   Influenza B by PCR NEGATIVE NEGATIVE Final    Comment: (NOTE) The Xpert Xpress SARS-CoV-2/FLU/RSV plus assay is intended as an aid in the diagnosis of influenza from Nasopharyngeal swab specimens and should not be used as a sole basis for treatment. Nasal washings and aspirates are unacceptable for Xpert Xpress SARS-CoV-2/FLU/RSV testing.  Fact  Sheet for Patients: EntrepreneurPulse.com.au  Fact Sheet for Healthcare Providers: IncredibleEmployment.be  This test  is not yet approved or cleared by the [place] and has been authorized for detection and/or diagnosis of SARS-CoV-2 by FDA under an Emergency Use Authorization (EUA). This EUA will remain in effect (meaning this test can be used) for the duration of the COVID-19 declaration under Section 564(b)(1) of the Act, 21 U.S.C. section 360bbb-3(b)(1), unless the authorization is terminated or revoked.  Performed at [facility], [place] [address], [place], [place] [address]   Resp Panel by RT-PCR (Flu A&B, Covid) Nasopharyngeal Swab     Status: None   Collection Time: 08/17/2021  7:45 PM   Specimen: Nasopharyngeal Swab; Nasopharyngeal(NP) swabs in vial transport medium  Result Value Ref Range Status   SARS Coronavirus 2 by RT PCR NEGATIVE NEGATIVE Final    Comment: (NOTE) SARS-CoV-2 target nucleic acids are NOT DETECTED.  The SARS-CoV-2 RNA is generally detectable in upper respiratory specimens during the acute phase of infection. The lowest concentration of SARS-CoV-2 viral copies this assay can detect is 138 copies/mL. A negative result does not preclude SARS-Cov-2 infection and should not be used as the sole basis for treatment or other patient management decisions. A negative result may occur with  improper specimen collection/handling, submission of specimen other than nasopharyngeal swab, presence of viral mutation(s) within the areas targeted by this assay, and inadequate number of viral copies(<138 copies/mL). A negative result must be combined with clinical observations, patient history, and epidemiological information. The expected result is Negative.  Fact Sheet for Patients:  EntrepreneurPulse.com.au  Fact Sheet for Healthcare Providers:  IncredibleEmployment.be  This test is  no t yet approved or cleared by the [place] FDA and  has been authorized for detection and/or diagnosis of SARS-CoV-2 by FDA under an Emergency Use Authorization (EUA). This EUA will remain  in effect (meaning this test can be used) for the duration of the COVID-19 declaration under Section 564(b)(1) of the Act, 21 U.S.C.section 360bbb-3(b)(1), unless the authorization is terminated  or revoked sooner.       Influenza A by PCR NEGATIVE NEGATIVE Final   Influenza B by PCR NEGATIVE NEGATIVE Final    Comment: (NOTE) The Xpert Xpress SARS-CoV-2/FLU/RSV plus assay is intended as an aid in the diagnosis of influenza from Nasopharyngeal swab specimens and should not be used as a sole basis for treatment. Nasal washings and aspirates are unacceptable for Xpert Xpress SARS-CoV-2/FLU/RSV testing.  Fact Sheet for Patients: EntrepreneurPulse.com.au  Fact Sheet for Healthcare Providers: IncredibleEmployment.be  This test is not yet approved or cleared by the [place] FDA and has been authorized for detection and/or diagnosis of SARS-CoV-2 by FDA under an Emergency Use Authorization (EUA). This EUA will remain in effect (meaning this test can be used) for the duration of the COVID-19 declaration under Section 564(b)(1) of the Act, 21 U.S.C. section 360bbb-3(b)(1), unless the authorization is terminated or revoked.  Performed at [facility], [place] [address], [place], [place] [address]     Lab Basic Metabolic Panel: Recent Labs  Lab 08/10/2021 1035 08/10/2021 1825 08/06/2021 1832 08/29/2021 2048 08/21/2021 2230 08/22/2021 2333  NA 139 136 140 139 139 143  K 3.0* 3.8 3.7 3.9 4.0 5.2*  CL 102 102 103  --   --  105  CO2 27 22  --   --   --  17*  GLUCOSE 332* 563* 497*  --   --  830*  BUN 20 25* 26*  --   --  25*  CREATININE 2.19* 2.53* 2.50*  --   --  2.92*  CALCIUM 9.2 9.3  --   --   --  8.4*  MG 2.1  --   --   --   --  2.6*  PHOS  --    --   --   --   --  8.9*   Liver Function Tests: Recent Labs  Lab 08/10/2021 1035 08/10/2021 1825 08/10/2021 2333  AST 24 28 398*  ALT 20 24 312*  ALKPHOS 73 79 69  BILITOT 0.7 0.6 0.7  PROT 7.0 7.5 3.8*  ALBUMIN 3.4* 3.4* 1.8*   No results for input(s): LIPASE, AMYLASE in the last 168 hours. No results for input(s): AMMONIA in the last 168 hours. CBC: Recent Labs  Lab 08/16/2021 1035 08/25/2021 1825 08/01/2021 1832 08/05/2021 2048 08/23/2021 2230 08/23/2021 2333  WBC 12.9* 12.4*  --   --   --  25.4*  NEUTROABS 9.4* 8.3*  --   --   --  17.5*  HGB 13.2 13.5 14.3 11.6* 10.9* 10.5*  HCT 39.8 41.1 42.0 34.0* 32.0* 33.6*  MCV 88.4 89.7  --   --   --  95.5  PLT 172 156  --   --   --  PLATELET CLUMPS NOTED ON SMEAR, UNABLE TO ESTIMATE   Cardiac Enzymes: No results for input(s): CKTOTAL, CKMB, CKMBINDEX, TROPONINI in the last 168 hours. Sepsis Labs: Recent Labs  Lab 08/23/2021 1035 08/30/2021 1825 08/05/2021 2020 08/03/2021 2333  WBC 12.9* 12.4*  --  25.4*  LATICACIDVEN  --   --  6.0* >9.0*    Procedures/Operations   Central line Intubation  Arterial line     Collier Bullock 08/16/2021, 12:56 PM

## 2021-08-31 NOTE — Procedures (Deleted)
Arterial Catheter Insertion Procedure Note  Eric Wade  [id number]  09-Jul-1941  Date:08/16/21  Time:12:07 PM    Provider Performing: Collier Bullock    Procedure: Insertion of Arterial Line [id number]) without US guidance  Indication(s) Blood pressure monitoring and/or need for frequent ABGs  Consent Unable to obtain consent due to emergent nature of procedure.  Anesthesia None   Time Out Verified patient identification, verified procedure, site/side was marked, verified correct patient position, special equipment/implants available, medications/allergies/relevant history reviewed, required imaging and test results available.   Sterile Technique Maximal sterile technique including full sterile barrier drape, hand hygiene, sterile gown, sterile gloves, mask, hair covering, sterile ultrasound probe cover (if used).   Procedure Description Area of catheter insertion was cleaned with chlorhexidine and draped in sterile fashion. Without real-time ultrasound guidance an arterial catheter was placed into the left radial artery.  Appropriate arterial tracings confirmed on monitor.     Complications/Tolerance None; patient tolerated the procedure well.   EBL Minimal   Specimen(s) None

## 2021-08-31 DEATH — deceased

## 2021-09-02 MED FILL — Medication: Qty: 1 | Status: AC

## 2022-06-07 IMAGING — CT CT HEAD W/O CM
3 of 4 series · 15 of 47 positions shown, 18 images · non-contrast
Comparison: Head CT 02/20/2019.

CLINICAL DATA: Head trauma, minor. Additional history provided:
Syncopal episode.

EXAM:
CT HEAD WITHOUT CONTRAST
TECHNIQUE: Contiguous axial images were obtained from the base of the skull
through the vertex without intravenous contrast.

[Series 3: head 5.0 h30s · axial · 0.40mm/px · z∈[-176,-41]mm · 9 of 33 slices shown, 12 images]
[im 3/33  brain]
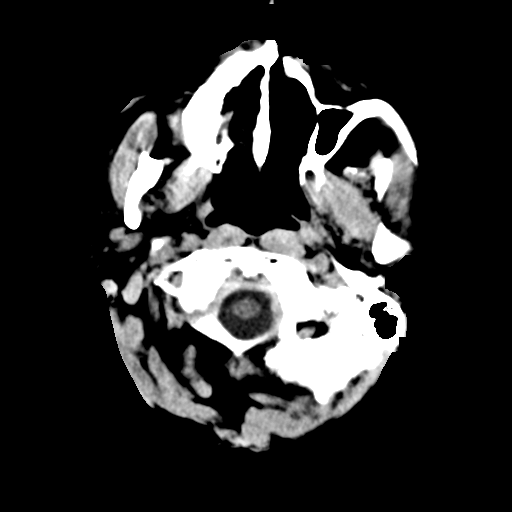
[im 3/33  bone]
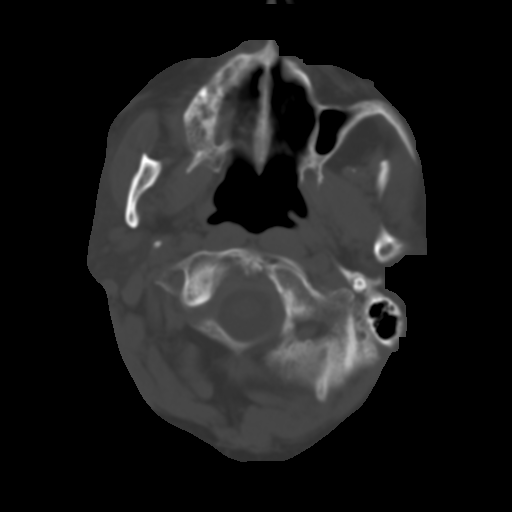
[im 7/33  brain]
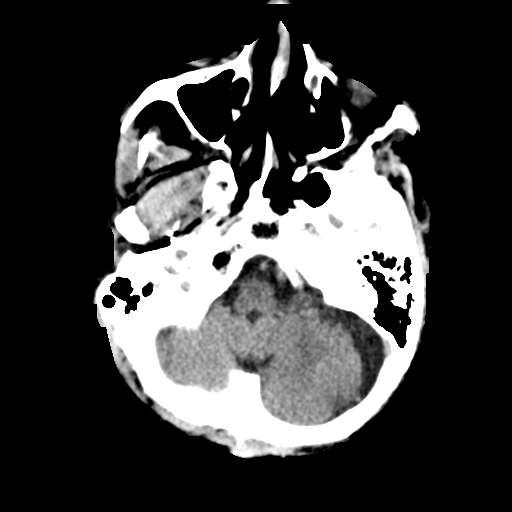
[im 10/33  brain]
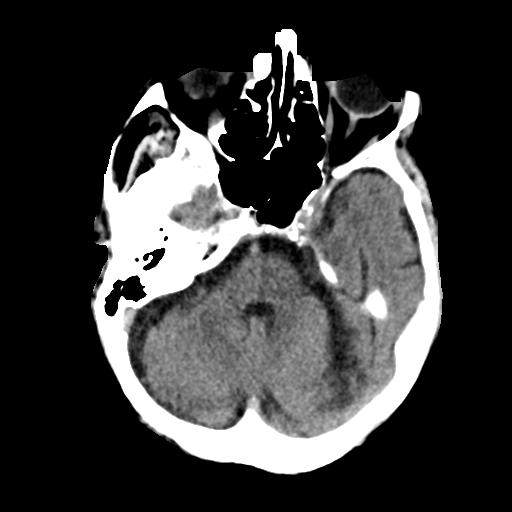
[im 14/33  brain]
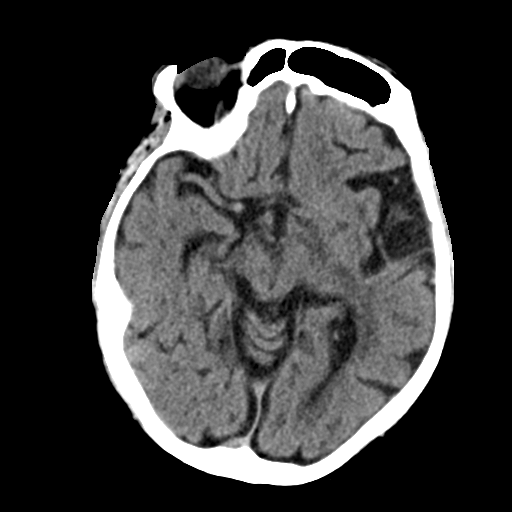
[im 17/33  brain]
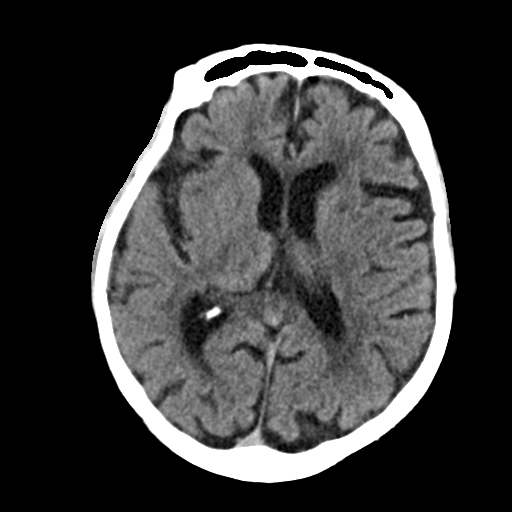
[im 17/33  bone]
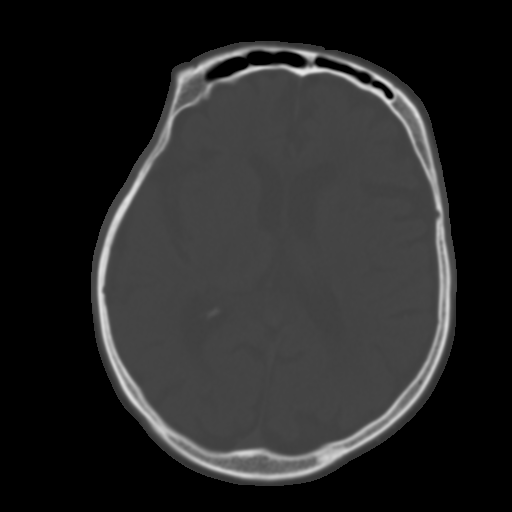
[im 19/33  brain]
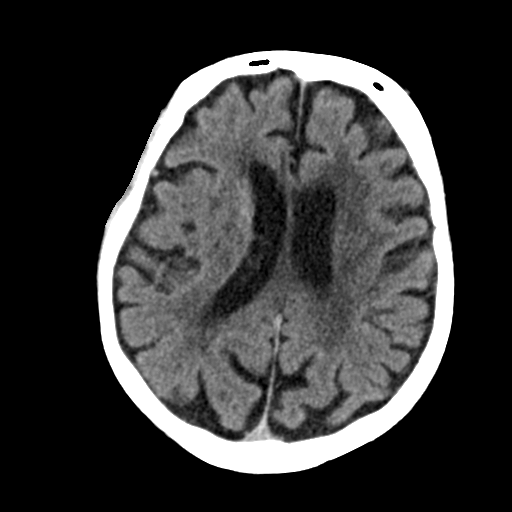
[im 23/33  brain]
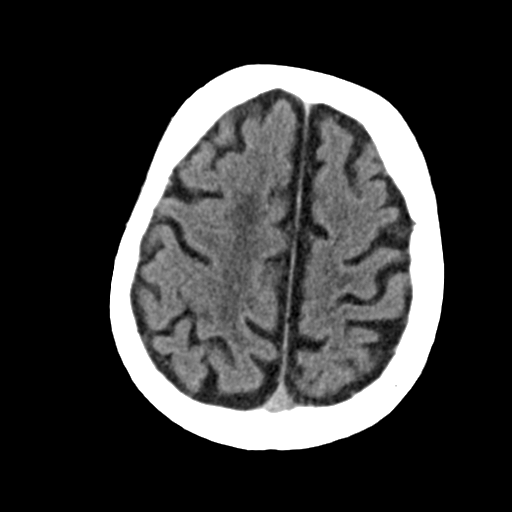
[im 26/33  brain]
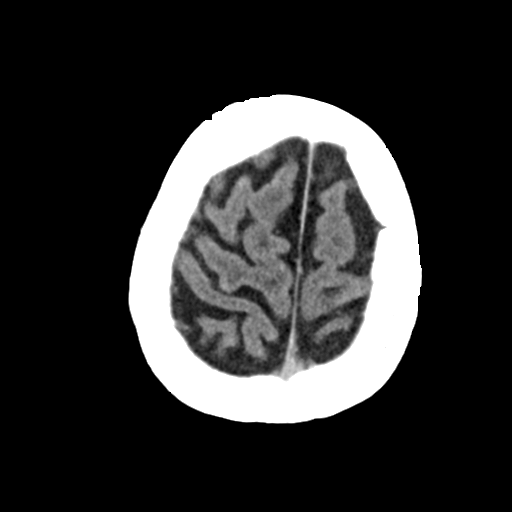
[im 30/33  brain]
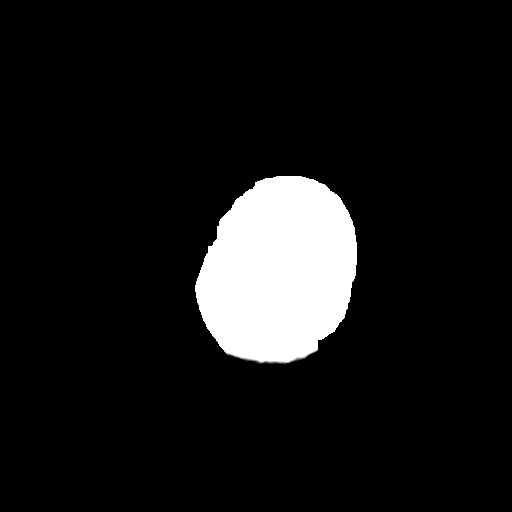
[im 30/33  bone]
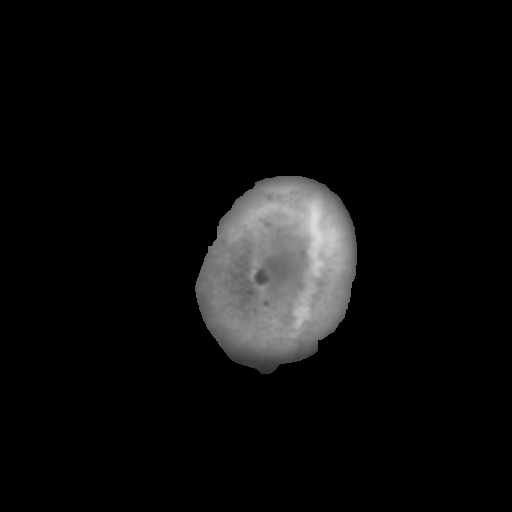

[Series 5: head 3.0 mpr cor · coronal · 0.33mm/px · 3 of 67 slices shown]
[im 23/67  brain]
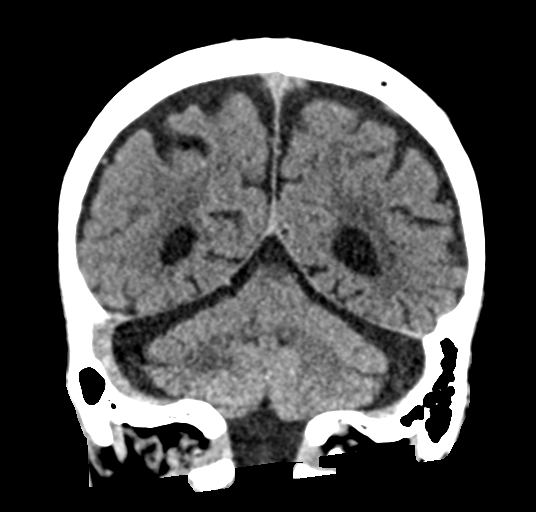
[im 30/67  brain]
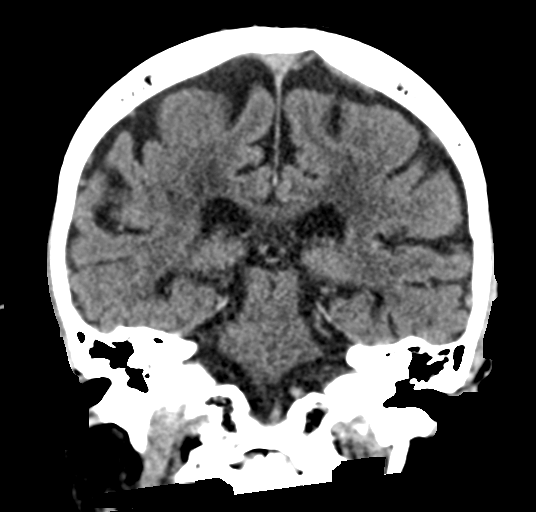
[im 37/67  brain]
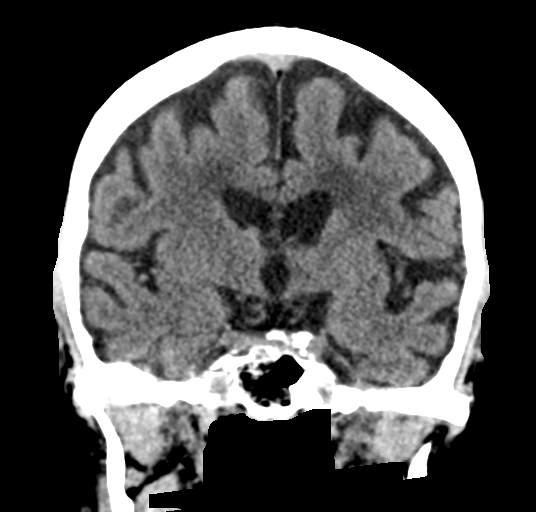

[Series 6: head 3.0 mpr sag · sagittal · 0.34mm/px · 3 of 57 slices shown]
[im 20/57  brain]
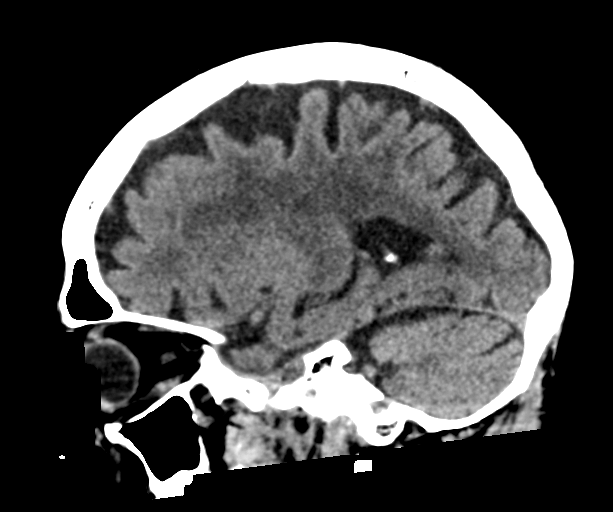
[im 29/57  brain]
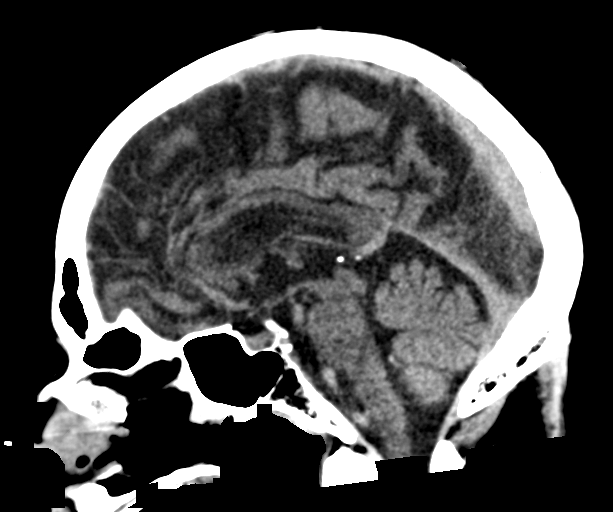
[im 37/57  brain]
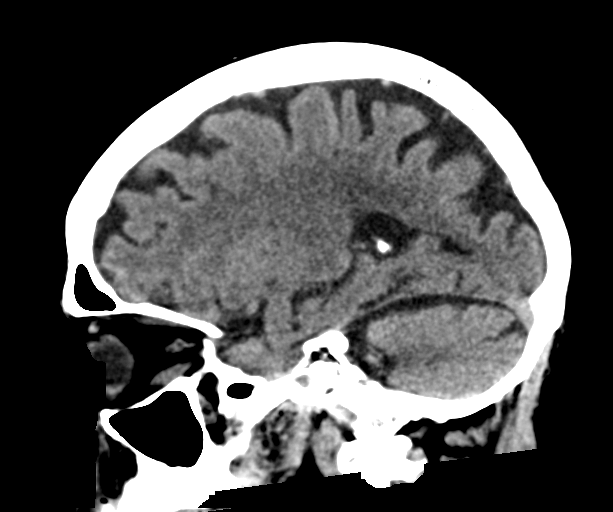

[15 of 47 positions shown; findings below may reference images not displayed]

FINDINGS: Brain:

Mild-to-moderate generalized cerebral atrophy.

Known chronic lacunar infarcts within the bilateral deep gray nuclei
and within the pons, some of which were better appreciated on the
brain MRI of 12/29/2018.

Moderate patchy and ill-defined hypoattenuation within the cerebral
white matter, nonspecific but compatible with chronic small vessel
ischemic disease.

There is no acute intracranial hemorrhage.

No demarcated cortical infarct.

No extra-axial fluid collection.

No evidence of an intracranial mass.

No midline shift.

Vascular: No hyperdense vessel.  Atherosclerotic calcifications.

Skull: Normal. Negative for fracture or focal lesion.

Sinuses/Orbits: Visualized orbits show no acute finding. Trace
mucosal thickening within the bilateral ethmoid and right maxillary
sinuses.
IMPRESSION: No evidence of acute intracranial abnormality.

Moderate chronic small vessel ischemic changes within the cerebral
white matter.

Known chronic lacunar infarcts within the bilateral deep gray nuclei
and within the pons, some of which were better appreciated on the
brain MRI of 12/29/2018.

Mild-to-moderate generalized cerebral atrophy.

## 2022-06-07 IMAGING — CT CT HEAD CODE STROKE
3 series · 15 of 47 positions shown, 18 images · non-contrast
Comparison: Earlier same day

CLINICAL DATA: Code stroke. New no deficit, acute, stroke
suspected.

EXAM:
CT HEAD WITHOUT CONTRAST
TECHNIQUE: Contiguous axial images were obtained from the base of the skull
through the vertex without intravenous contrast.

[Series 3: head 5.0 st · axial · 0.43mm/px · z∈[-76,+54]mm · 9 of 32 slices shown, 12 images]
[im 3/32  brain]
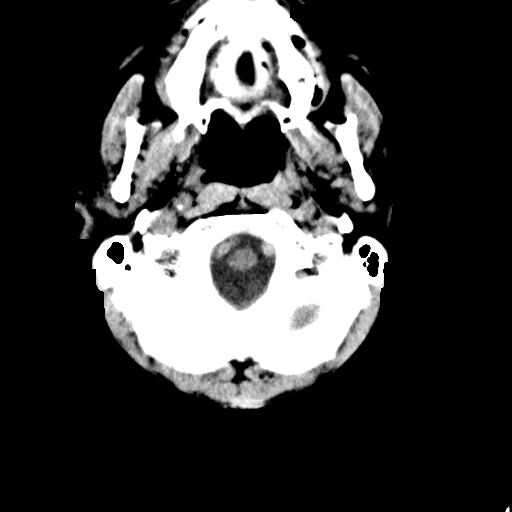
[im 3/32  bone]
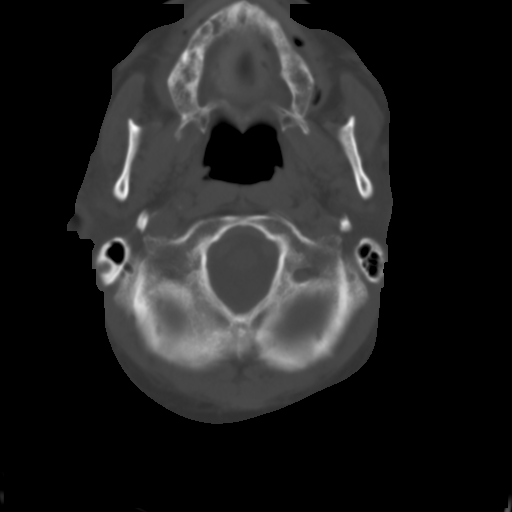
[im 6/32  brain]
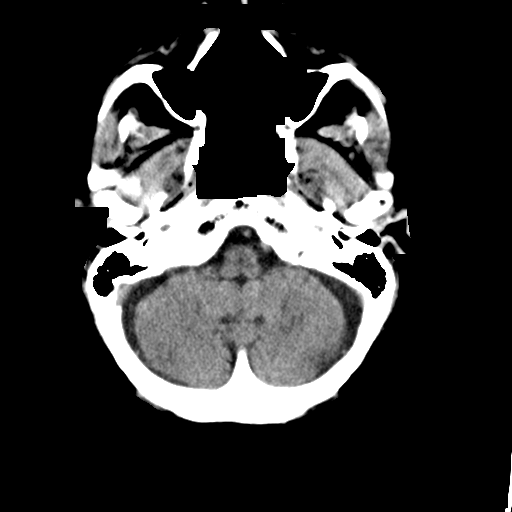
[im 9/32  brain]
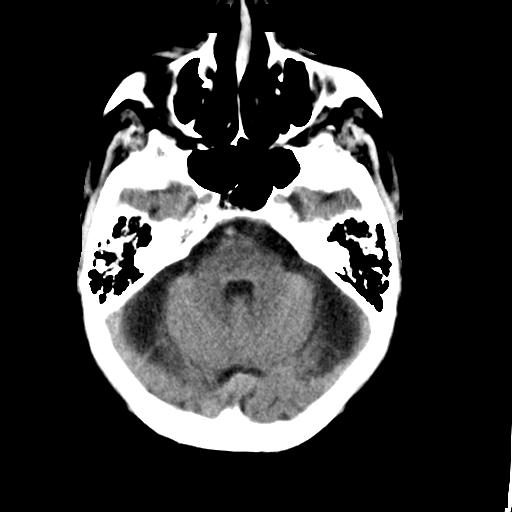
[im 12/32  brain]
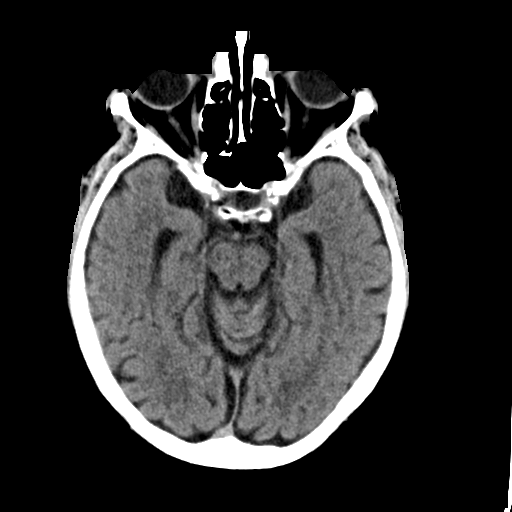
[im 17/32  brain]
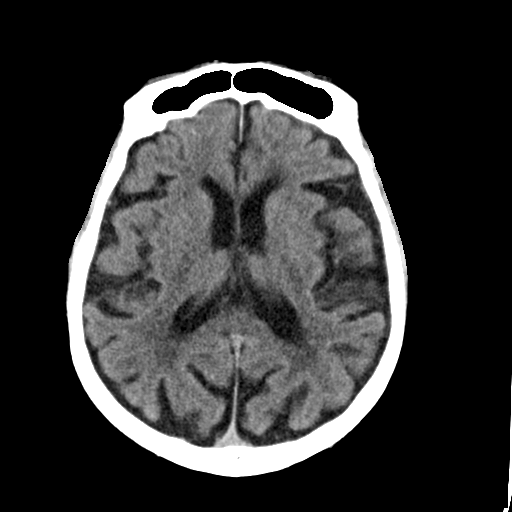
[im 17/32  bone]
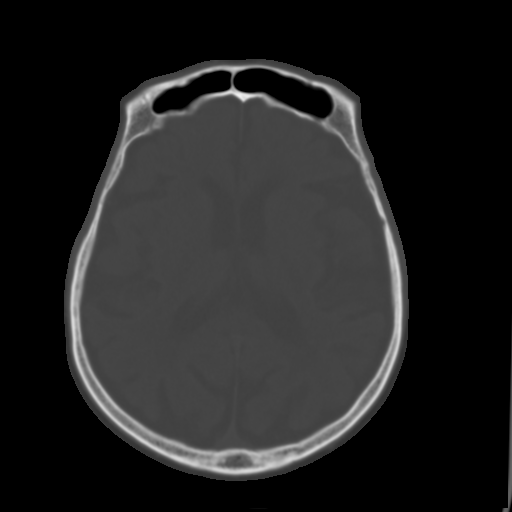
[im 20/32  brain]
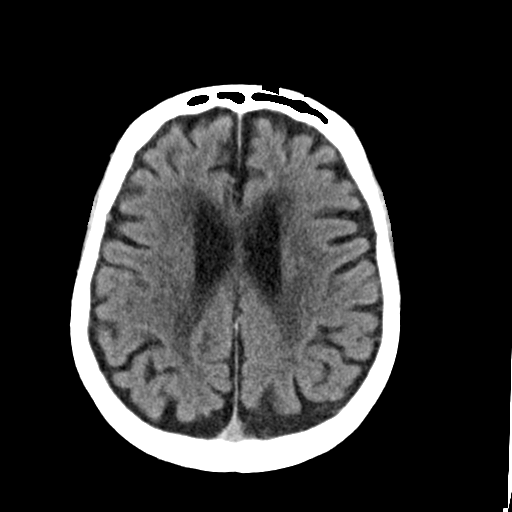
[im 23/32  brain]
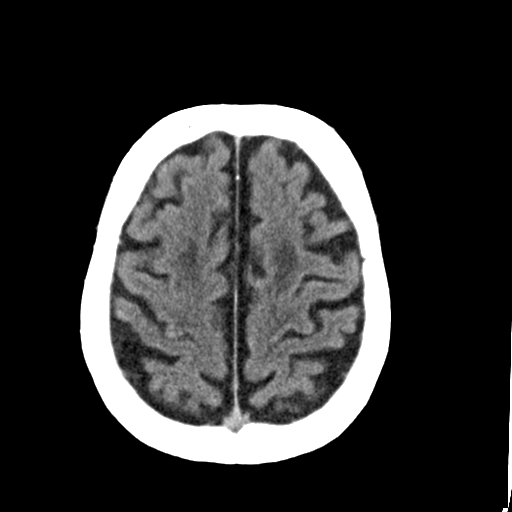
[im 26/32  brain]
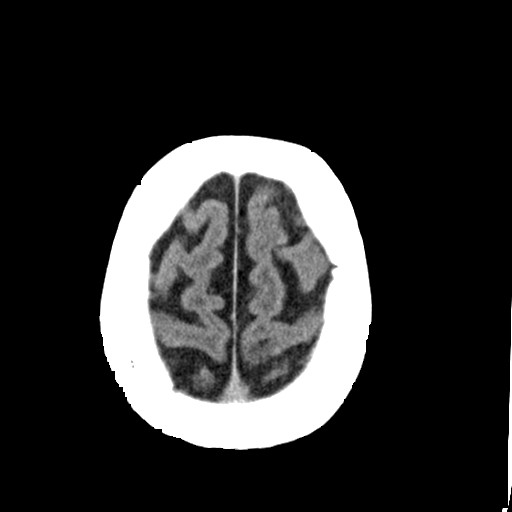
[im 29/32  brain]
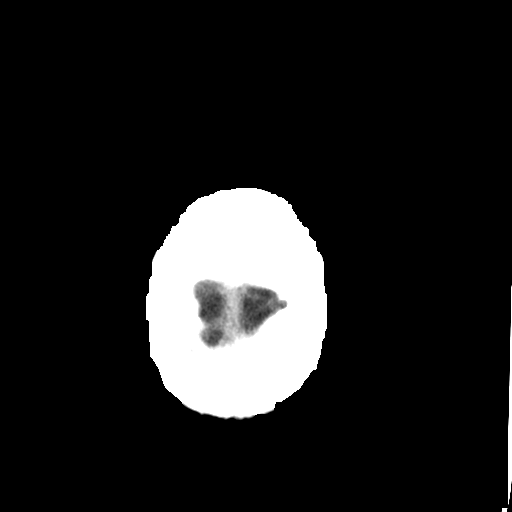
[im 29/32  bone]
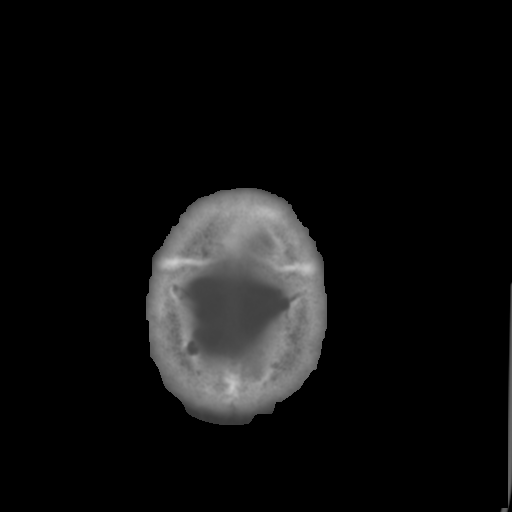

[Series 5: head 3.0 cor st · coronal · 0.31mm/px · 3 of 73 slices shown]
[im 25/73  brain]
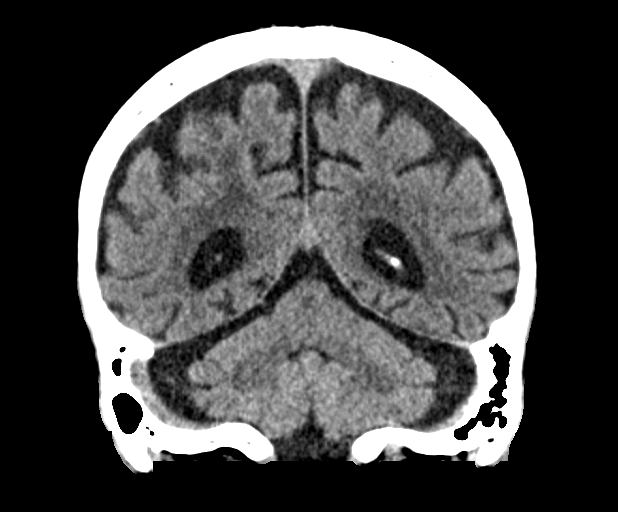
[im 33/73  brain]
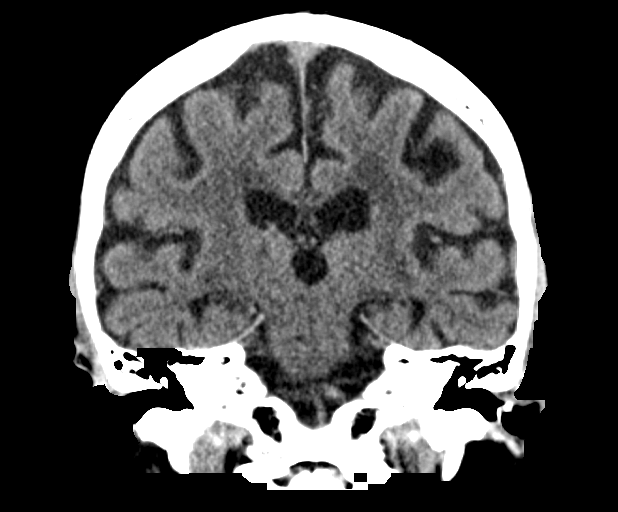
[im 40/73  brain]
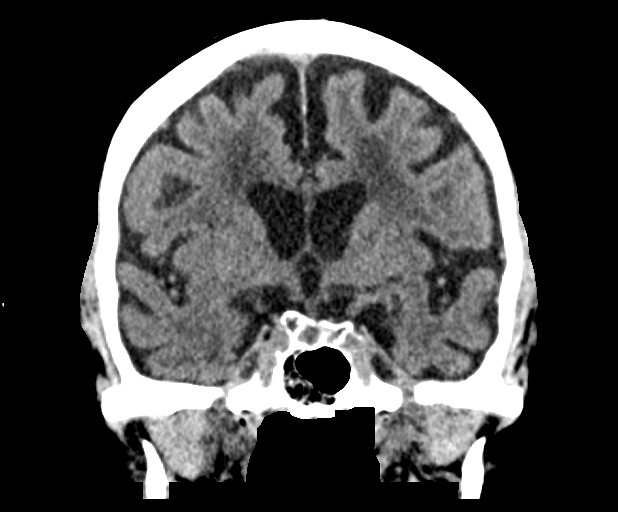

[Series 6: head 3.0 sag st · sagittal · 0.31mm/px · 3 of 66 slices shown]
[im 22/66  brain]
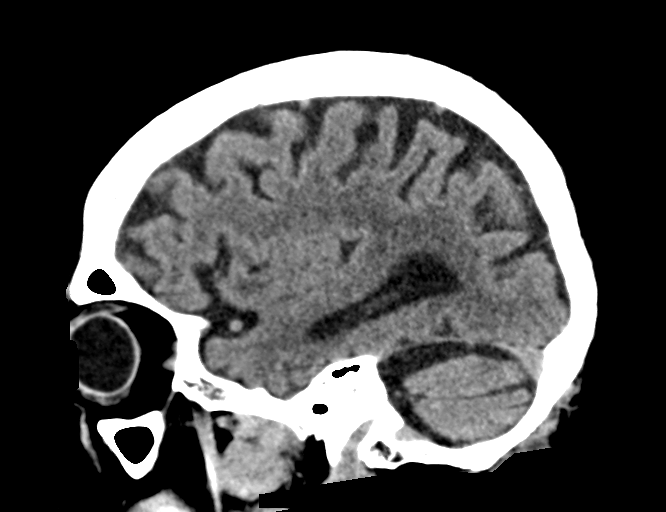
[im 33/66  brain]
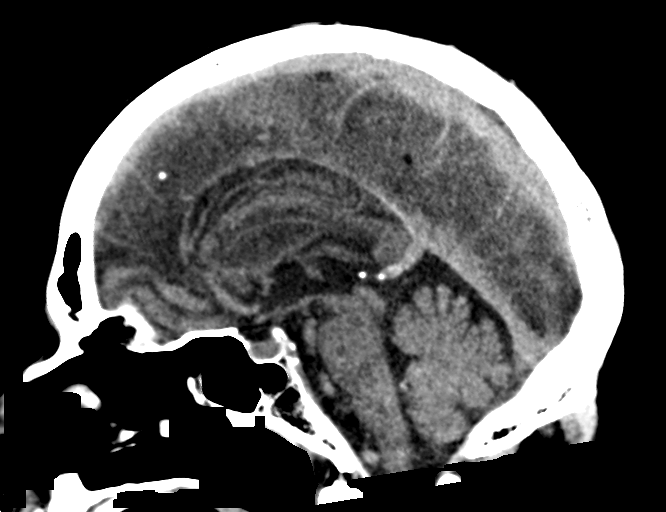
[im 44/66  brain]
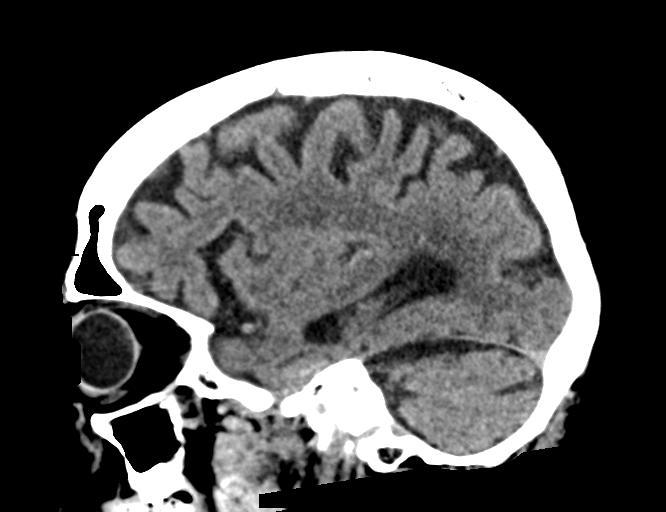

[15 of 47 positions shown; findings below may reference images not displayed]

FINDINGS: Brain: Chronic small-vessel ischemic changes affect the pons. No
focal cerebellar finding. Chronic small-vessel ischemic changes
affect the thalami, basal ganglia and cerebral hemispheric white
matter. No cortical or large vessel territory infarction. No mass
lesion, hemorrhage, hydrocephalus or extra-axial collection.

Vascular: There is atherosclerotic calcification of the major
vessels at the base of the brain.

Skull: Negative

Sinuses/Orbits: Clear/normal

Other: None

ASPECTS (Alberta Stroke Program Early CT Score)

- Ganglionic level infarction (caudate, lentiform nuclei, internal
capsule, insula, M1-M3 cortex): 7

- Supraganglionic infarction (M4-M6 cortex): 3

Total score (0-10 with 10 being normal): 10
IMPRESSION: 1. No change since earlier today. Atrophy and chronic small-vessel
ischemic changes throughout the brain as outlined above.
2. ASPECTS is 10
3. These results were communicated to Dr. Lienad at [DATE] on
08/11/2021 by text page via the [employer] messaging system.
